# Patient Record
Sex: Female | Born: 1970 | Hispanic: Yes | Marital: Married | Smoking: Never smoker
Health system: Southern US, Community
[De-identification: ages and names within clinical notes are randomized; demographics above are authoritative.]

## PROBLEM LIST (undated history)

## (undated) DIAGNOSIS — R7303 Prediabetes: Secondary | ICD-10-CM

## (undated) DIAGNOSIS — K219 Gastro-esophageal reflux disease without esophagitis: Secondary | ICD-10-CM

## (undated) DIAGNOSIS — F32A Depression, unspecified: Secondary | ICD-10-CM

## (undated) DIAGNOSIS — K760 Fatty (change of) liver, not elsewhere classified: Secondary | ICD-10-CM

## (undated) DIAGNOSIS — F329 Major depressive disorder, single episode, unspecified: Secondary | ICD-10-CM

## (undated) DIAGNOSIS — K269 Duodenal ulcer, unspecified as acute or chronic, without hemorrhage or perforation: Secondary | ICD-10-CM

## (undated) HISTORY — DX: Duodenal ulcer, unspecified as acute or chronic, without hemorrhage or perforation: K26.9

## (undated) HISTORY — DX: Prediabetes: R73.03

## (undated) HISTORY — DX: Depression, unspecified: F32.A

## (undated) HISTORY — DX: Major depressive disorder, single episode, unspecified: F32.9

## (undated) HISTORY — DX: Fatty (change of) liver, not elsewhere classified: K76.0

## (undated) HISTORY — DX: Gastro-esophageal reflux disease without esophagitis: K21.9

---

## 1999-09-12 ENCOUNTER — Other Ambulatory Visit: Admission: RE | Admit: 1999-09-12 | Discharge: 1999-09-12 | Payer: Self-pay | Admitting: Gynecology

## 2000-12-24 ENCOUNTER — Other Ambulatory Visit: Admission: RE | Admit: 2000-12-24 | Discharge: 2000-12-24 | Payer: Self-pay | Admitting: Gynecology

## 2001-10-07 ENCOUNTER — Encounter: Admission: RE | Admit: 2001-10-07 | Discharge: 2001-10-07 | Payer: Self-pay | Admitting: Family Medicine

## 2001-10-07 ENCOUNTER — Encounter: Payer: Self-pay | Admitting: Family Medicine

## 2002-02-23 ENCOUNTER — Encounter: Admission: RE | Admit: 2002-02-23 | Discharge: 2002-03-28 | Payer: Self-pay | Admitting: Gynecology

## 2002-05-07 ENCOUNTER — Inpatient Hospital Stay (HOSPITAL_COMMUNITY): Admission: AD | Admit: 2002-05-07 | Discharge: 2002-05-10 | Payer: Self-pay | Admitting: Gynecology

## 2002-07-26 ENCOUNTER — Other Ambulatory Visit: Admission: RE | Admit: 2002-07-26 | Discharge: 2002-07-26 | Payer: Self-pay | Admitting: Gynecology

## 2003-09-28 ENCOUNTER — Other Ambulatory Visit: Admission: RE | Admit: 2003-09-28 | Discharge: 2003-09-28 | Payer: Self-pay | Admitting: Gynecology

## 2005-02-24 ENCOUNTER — Other Ambulatory Visit: Admission: RE | Admit: 2005-02-24 | Discharge: 2005-02-24 | Payer: Self-pay | Admitting: Gynecology

## 2006-01-01 ENCOUNTER — Other Ambulatory Visit: Admission: RE | Admit: 2006-01-01 | Discharge: 2006-01-01 | Payer: Self-pay | Admitting: Gynecology

## 2006-06-29 ENCOUNTER — Inpatient Hospital Stay (HOSPITAL_COMMUNITY): Admission: AD | Admit: 2006-06-29 | Discharge: 2006-07-01 | Payer: Self-pay | Admitting: Gynecology

## 2006-06-29 ENCOUNTER — Encounter (INDEPENDENT_AMBULATORY_CARE_PROVIDER_SITE_OTHER): Payer: Self-pay | Admitting: *Deleted

## 2006-06-29 HISTORY — PX: TUBAL LIGATION: SHX77

## 2006-07-03 ENCOUNTER — Inpatient Hospital Stay (HOSPITAL_COMMUNITY): Admission: AD | Admit: 2006-07-03 | Discharge: 2006-07-03 | Payer: Self-pay | Admitting: Gynecology

## 2006-08-05 ENCOUNTER — Other Ambulatory Visit: Admission: RE | Admit: 2006-08-05 | Discharge: 2006-08-05 | Payer: Self-pay | Admitting: Gynecology

## 2006-09-18 ENCOUNTER — Emergency Department (HOSPITAL_COMMUNITY): Admission: EM | Admit: 2006-09-18 | Discharge: 2006-09-19 | Payer: Self-pay | Admitting: Emergency Medicine

## 2007-07-11 ENCOUNTER — Ambulatory Visit: Payer: Self-pay | Admitting: Internal Medicine

## 2007-07-11 DIAGNOSIS — R079 Chest pain, unspecified: Secondary | ICD-10-CM | POA: Insufficient documentation

## 2007-07-11 DIAGNOSIS — R7303 Prediabetes: Secondary | ICD-10-CM | POA: Insufficient documentation

## 2007-07-15 ENCOUNTER — Ambulatory Visit: Payer: Self-pay | Admitting: Internal Medicine

## 2007-07-19 ENCOUNTER — Encounter (INDEPENDENT_AMBULATORY_CARE_PROVIDER_SITE_OTHER): Payer: Self-pay | Admitting: *Deleted

## 2007-09-16 ENCOUNTER — Other Ambulatory Visit: Admission: RE | Admit: 2007-09-16 | Discharge: 2007-09-16 | Payer: Self-pay | Admitting: Gynecology

## 2008-03-09 ENCOUNTER — Ambulatory Visit: Payer: Self-pay | Admitting: Internal Medicine

## 2008-03-09 DIAGNOSIS — R1013 Epigastric pain: Secondary | ICD-10-CM | POA: Insufficient documentation

## 2008-03-09 LAB — CONVERTED CEMR LAB
Ketones, urine, test strip: NEGATIVE
Nitrite: NEGATIVE
Specific Gravity, Urine: 1.015
Urobilinogen, UA: 0.2
WBC Urine, dipstick: NEGATIVE
pH: 6

## 2008-03-13 ENCOUNTER — Encounter: Payer: Self-pay | Admitting: Internal Medicine

## 2008-03-13 LAB — CONVERTED CEMR LAB: RBC / HPF: NONE SEEN (ref <3)

## 2008-03-15 ENCOUNTER — Telehealth (INDEPENDENT_AMBULATORY_CARE_PROVIDER_SITE_OTHER): Payer: Self-pay | Admitting: *Deleted

## 2008-03-16 ENCOUNTER — Encounter: Admission: RE | Admit: 2008-03-16 | Discharge: 2008-03-16 | Payer: Self-pay | Admitting: Internal Medicine

## 2008-03-21 ENCOUNTER — Encounter (INDEPENDENT_AMBULATORY_CARE_PROVIDER_SITE_OTHER): Payer: Self-pay | Admitting: *Deleted

## 2008-03-26 ENCOUNTER — Ambulatory Visit: Payer: Self-pay | Admitting: Internal Medicine

## 2008-03-27 LAB — CONVERTED CEMR LAB
AST: 23 units/L (ref 0–37)
Albumin: 3.9 g/dL (ref 3.5–5.2)
Alkaline Phosphatase: 39 units/L (ref 39–117)
Basophils Relative: 0.1 % (ref 0.0–3.0)
Eosinophils Absolute: 0.3 10*3/uL (ref 0.0–0.7)
Eosinophils Relative: 5.7 % — ABNORMAL HIGH (ref 0.0–5.0)
Hemoglobin: 13.4 g/dL (ref 12.0–15.0)
Lymphocytes Relative: 41.1 % (ref 12.0–46.0)
MCV: 97.4 fL (ref 78.0–100.0)
Neutrophils Relative %: 44.7 % (ref 43.0–77.0)
RBC: 3.98 M/uL (ref 3.87–5.11)
Total Protein: 6.9 g/dL (ref 6.0–8.3)
WBC: 5.2 10*3/uL (ref 4.5–10.5)

## 2008-03-29 ENCOUNTER — Encounter (INDEPENDENT_AMBULATORY_CARE_PROVIDER_SITE_OTHER): Payer: Self-pay | Admitting: *Deleted

## 2008-04-23 ENCOUNTER — Ambulatory Visit: Payer: Self-pay | Admitting: Gastroenterology

## 2008-04-26 ENCOUNTER — Ambulatory Visit: Payer: Self-pay | Admitting: Gastroenterology

## 2008-06-20 ENCOUNTER — Ambulatory Visit: Payer: Self-pay | Admitting: Gastroenterology

## 2008-10-26 ENCOUNTER — Other Ambulatory Visit: Admission: RE | Admit: 2008-10-26 | Discharge: 2008-10-26 | Payer: Self-pay | Admitting: Gynecology

## 2008-10-26 ENCOUNTER — Encounter: Payer: Self-pay | Admitting: Gynecology

## 2008-10-26 ENCOUNTER — Ambulatory Visit: Payer: Self-pay | Admitting: Gynecology

## 2009-11-15 ENCOUNTER — Ambulatory Visit: Payer: Self-pay | Admitting: Gynecology

## 2009-11-15 ENCOUNTER — Other Ambulatory Visit: Admission: RE | Admit: 2009-11-15 | Discharge: 2009-11-15 | Payer: Self-pay | Admitting: Gynecology

## 2009-11-22 ENCOUNTER — Ambulatory Visit (HOSPITAL_COMMUNITY): Admission: RE | Admit: 2009-11-22 | Discharge: 2009-11-22 | Payer: Self-pay | Admitting: Gynecology

## 2009-12-06 ENCOUNTER — Ambulatory Visit: Payer: Self-pay | Admitting: Internal Medicine

## 2009-12-06 DIAGNOSIS — B353 Tinea pedis: Secondary | ICD-10-CM | POA: Insufficient documentation

## 2009-12-06 DIAGNOSIS — B351 Tinea unguium: Secondary | ICD-10-CM | POA: Insufficient documentation

## 2009-12-06 LAB — CONVERTED CEMR LAB
AST: 16 units/L (ref 0–37)
Alkaline Phosphatase: 47 units/L (ref 39–117)
BUN: 17 mg/dL (ref 6–23)
Basophils Absolute: 0 10*3/uL (ref 0.0–0.1)
Calcium: 9.4 mg/dL (ref 8.4–10.5)
GFR calc non Af Amer: 91.26 mL/min (ref >60)
Glucose, Bld: 95 mg/dL (ref 70–99)
Lymphocytes Relative: 32.9 % (ref 12.0–46.0)
Monocytes Relative: 6.9 % (ref 3.0–12.0)
Neutrophils Relative %: 58.5 % (ref 43.0–77.0)
Platelets: 244 10*3/uL (ref 150.0–400.0)
RDW: 13.9 % (ref 11.5–14.6)
Total Bilirubin: 0.8 mg/dL (ref 0.3–1.2)

## 2009-12-09 ENCOUNTER — Encounter: Payer: Self-pay | Admitting: Internal Medicine

## 2010-07-06 ENCOUNTER — Encounter: Payer: Self-pay | Admitting: Internal Medicine

## 2010-07-13 LAB — CONVERTED CEMR LAB
ALT: 21 units/L (ref 0–35)
AST: 22 units/L (ref 0–37)
BUN: 11 mg/dL (ref 6–23)
Basophils Absolute: 0 10*3/uL (ref 0.0–0.1)
Calcium: 8.5 mg/dL (ref 8.4–10.5)
Cholesterol: 116 mg/dL (ref 0–200)
Eosinophils Absolute: 0.1 10*3/uL (ref 0.0–0.6)
GFR calc Af Amer: 145 mL/min
GFR calc non Af Amer: 120 mL/min
HDL: 34.7 mg/dL — ABNORMAL LOW (ref >39.0)
Hemoglobin: 13.5 g/dL (ref 12.0–15.0)
Lymphocytes Relative: 26 % (ref 12.0–46.0)
MCHC: 33.5 g/dL (ref 30.0–36.0)
Monocytes Absolute: 0.4 10*3/uL (ref 0.2–0.7)
Monocytes Relative: 12.8 % — ABNORMAL HIGH (ref 3.0–11.0)
Neutro Abs: 2 10*3/uL (ref 1.4–7.7)
Platelets: 235 10*3/uL (ref 150–400)
Potassium: 3.5 meq/L (ref 3.5–5.1)
Triglycerides: 71 mg/dL (ref 0–149)

## 2010-07-15 NOTE — Letter (Signed)
Summary: Results Follow-up Letter  [facility]  [address] [place], [place] [address]   Phone: [phone]  Fax: [phone]    12/09/2009  [address] [place], [place]  [address]  Dear Ms. Gwynne,   The following are the results of your recent test(s):  Test     Result     Chest Xray     normal Liver/kidney   normal CBC       normal  _________________________________________________________  Please call for an appointment in 1-2 months _________________________________________________________ _________________________________________________________ _________________________________________________________  Sincerely,  Sanda Linger MD [facility]

## 2010-07-15 NOTE — Assessment & Plan Note (Signed)
Summary: NEW / BCBS/#/CD   Vital Signs:  Patient profile:   40 year old female Menstrual status:  regular LMP:     12/02/2009 Height:      60 inches Weight:      120.75 pounds BMI:     23.67 O2 Sat:      98 % on Room air Temp:     97.9 degrees F oral Pulse rate:   57 / minute Pulse rhythm:   regular BP sitting:   104 / 70  (left arm) Cuff size:   large  Vitals Entered By: Rock Nephew CMA (December 06, 2009 3:54 PM)  O2 Flow:  Room air CC: left chest pain for 6 months Is Patient Diabetic? No Pain Assessment Patient in pain? no      LMP (date): 12/02/2009     Menstrual Status regular Enter LMP: 12/02/2009   Primary Care Provider:  Etta Grandchild MD  CC:  left chest pain for 6 months.  History of Present Illness:       This is a 40 year old female who presents with Chest pain.  The symptoms began 4-6 months ago.  On a scale of 1 to 10, the intensity is described as a 1.  The patient reports resting chest pain, but denies exertional chest pain, nausea, vomiting, diaphoresis, shortness of breath, palpitations, dizziness, light headedness, syncope, and indigestion.  The pain is described as intermittent and sharp.  The pain is located in the left upper chest.  Episodes of chest pain last 5-10 minutes.  It hurts when she presses on a specific area in the left axillary region. Her recent mammogram was normal. She wants an xray done of the area.  She also c/o's toenail fungus and itchy rash on both feet.  Preventive Screening-Counseling & Management  Alcohol-Tobacco     Alcohol drinks/day: <1     Alcohol type: all     >5/day in last 3 mos: no     Alcohol Counseling: not indicated; use of alcohol is not excessive or problematic     Feels need to cut down: no     Feels annoyed by complaints: no     Feels guilty re: drinking: no     Needs 'eye opener' in am: no     Smoking Status: never  Hep-HIV-STD-Contraception     Hepatitis Risk: no risk noted     HIV Risk: no risk  noted     STD Risk: no risk noted     SBE monthly: yes     SBE Education/Counseling: to perform regular SBE      Sexual History:  currently monogamous.        Drug Use:  never.        Blood Transfusions:  no.    Clinical Review Panels:  Diabetes Management   Creatinine:  0.6 (07/11/2007)  CBC   WBC:  5.2 (03/26/2008)   RBC:  3.98 (03/26/2008)   Hgb:  13.4 (03/26/2008)   Hct:  38.7 (03/26/2008)   Platelets:  207 (03/26/2008)   MCV  97.4 (03/26/2008)   MCHC  34.5 (03/26/2008)   RDW  12.5 (03/26/2008)   PMN:  44.7 (03/26/2008)   Lymphs:  41.1 (03/26/2008)   Monos:  8.4 (03/26/2008)   Eosinophils:  5.7 (03/26/2008)   Basophil:  0.1 (03/26/2008)  Complete Metabolic Panel   Glucose:  96 (07/11/2007)   Sodium:  138 (07/11/2007)   Potassium:  3.5 (07/11/2007)   Chloride:  106 (07/11/2007)  CO2:  26 (07/11/2007)   BUN:  11 (07/11/2007)   Creatinine:  0.6 (07/11/2007)   Albumin:  3.9 (03/26/2008)   Total Protein:  6.9 (03/26/2008)   Calcium:  8.5 (07/11/2007)   Total Bili:  0.8 (03/26/2008)   Alk Phos:  39 (03/26/2008)   SGPT (ALT):  32 (03/26/2008)   SGOT (AST):  23 (03/26/2008)   Medications Prior to Update: 1)  Protonix 40 Mg Tbec (Pantoprazole Sodium) .Marland Kitchen.. 1 By Mouth Two Times A Day Before Meals 2)  Hyomax-Sr 0.375 Mg Xr12h-Tab (Hyoscyamine Sulfate) .Marland Kitchen.. 1 Tab Twice A Day For 5 Days Then As Needed  Current Medications (verified): 1)  Ciclopirox 0.77 % Gel (Ciclopirox) .... Apply To Aa Two Times A Day For 14 Days 2)  Terbinafine Hcl 250 Mg Tabs (Terbinafine Hcl) .... One By Mouth Once Daily  Allergies (verified): No Known Drug Allergies  Past History:  Past Medical History: Last updated: 07/11/2007 G3P3 h/o gestational DM in first 2 preg.  Past Surgical History: Last updated: 07/11/2007 Tubal ligation  Family History: Last updated: 07/11/2007 DM--uncle CAD--no colon ca--no breast ca--no HTN-- ?  Social History: Last updated:  04/23/2008 Married 3 kids From [place] Patient has never smoked.  Alcohol Use - no  Risk Factors: Alcohol Use: <1 (12/06/2009) >5 drinks/d w/in last 3 months: no (12/06/2009)  Risk Factors: Smoking Status: never (12/06/2009)  Family History: Reviewed history from 07/11/2007 and no changes required. DM--uncle CAD--no colon ca--no breast ca--no HTN-- ?  Social History: Reviewed history from 04/23/2008 and no changes required. Married 3 kids From [place] Patient has never smoked.  Alcohol Use - no Hepatitis Risk:  no risk noted HIV Risk:  no risk noted STD Risk:  no risk noted Sexual History:  currently monogamous Drug Use:  never Blood Transfusions:  no  Review of Systems  The patient denies anorexia, fever, weight loss, weight gain, syncope, dyspnea on exertion, peripheral edema, prolonged cough, headaches, hemoptysis, abdominal pain, melena, hematochezia, severe indigestion/heartburn, hematuria, suspicious skin lesions, enlarged lymph nodes, angioedema, and breast masses.   General:  Denies chills, fatigue, fever, loss of appetite, malaise, and sleep disorder. Resp:  Complains of chest discomfort; denies chest pain with inspiration, cough, coughing up blood, shortness of breath, sputum productive, and wheezing.  Physical Exam  General:  alert, well-developed, well-nourished, well-hydrated, appropriate dress, and normal appearance.   Head:  normocephalic and atraumatic.   Eyes:  No corneal or conjunctival inflammation noted. EOMI. Perrla. Funduscopic exam benign, without hemorrhages, exudates or papilledema. Vision grossly normal. Mouth:  Oral mucosa and oropharynx without lesions or exudates.  Teeth in good repair. Neck:  supple, full ROM, no masses, no thyromegaly, no thyroid nodules or tenderness, no JVD, normal carotid upstroke, no carotid bruits, no cervical lymphadenopathy, and no neck tenderness.   Chest Wall:  no deformities, no tenderness, and no  mass.   Breasts:  No mass, nodules, thickening, tenderness, bulging, retraction, inflamation, nipple discharge or skin changes noted.   Lungs:  Normal respiratory effort, chest expands symmetrically. Lungs are clear to auscultation, no crackles or wheezes. Heart:  Normal rate and regular rhythm. S1 and S2 normal without gallop, murmur, click, rub or other extra sounds. Abdomen:  soft, non-tender, normal bowel sounds, no distention, no masses, no guarding, no rigidity, no rebound tenderness, no abdominal hernia, no inguinal hernia, no hepatomegaly, and no splenomegaly.   Msk:  normal ROM, no joint tenderness, no joint swelling, no joint warmth, no redness over joints, no joint deformities, no joint  instability, and no crepitation.   Pulses:  R and L carotid,radial,femoral,dorsalis pedis and posterior tibial pulses are full and equal bilaterally Extremities:  No clubbing, cyanosis, edema, or deformity noted with normal full range of motion of all joints.   Neurologic:  No cranial nerve deficits noted. Station and gait are normal. Plantar reflexes are down-going bilaterally. DTRs are symmetrical throughout. Sensory, motor and coordinative functions appear intact. Skin:  feet have diffuse scaling on sides and bottoms of the feet and all toenails have sugungual debris, lysis, and thickening Cervical Nodes:  no anterior cervical adenopathy and no posterior cervical adenopathy.   Axillary Nodes:  no R axillary adenopathy and no L axillary adenopathy.   Inguinal Nodes:  no R inguinal adenopathy and no L inguinal adenopathy.   Psych:  Cognition and judgment appear intact. Alert and cooperative with normal attention span and concentration. No apparent delusions, illusions, hallucinations   Impression & Recommendations:  Problem # 1:  ONYCHOMYCOSIS, TOENAILS (ICD-110.1) Assessment New  Her updated medication list for this problem includes:    Ciclopirox 0.77 % Gel (Ciclopirox) .Marland Kitchen... Apply to aa two times  a day for 14 days    Terbinafine Hcl 250 Mg Tabs (Terbinafine hcl) ..... One by mouth once daily  Orders: Venipuncture (66440) TLB-BMP (Basic Metabolic Panel-BMET) (80048-METABOL) TLB-CBC Platelet - w/Differential (85025-CBCD) TLB-Hepatic/Liver Function Pnl (80076-HEPATIC)  Problem # 2:  DERMATOPHYTOSIS OF FOOT (ICD-110.4) Assessment: New  Her updated medication list for this problem includes:    Ciclopirox 0.77 % Gel (Ciclopirox) .Marland Kitchen... Apply to aa two times a day for 14 days    Terbinafine Hcl 250 Mg Tabs (Terbinafine hcl) ..... One by mouth once daily  Orders: Venipuncture (34742) TLB-BMP (Basic Metabolic Panel-BMET) (80048-METABOL) TLB-CBC Platelet - w/Differential (85025-CBCD) TLB-Hepatic/Liver Function Pnl (80076-HEPATIC)  Problem # 3:  CHEST PAIN (ICD-786.50) Assessment: Unchanged will check for structural lesions of the rib cage, she has no s/s of systemic illness so I favor a MS cause for her pain. Orders: T-2 View CXR (71020TC) Venipuncture (59563) TLB-BMP (Basic Metabolic Panel-BMET) (80048-METABOL) TLB-CBC Platelet - w/Differential (85025-CBCD) TLB-Hepatic/Liver Function Pnl (80076-HEPATIC)  Complete Medication List: 1)  Ciclopirox 0.77 % Gel (Ciclopirox) .... Apply to aa two times a day for 14 days 2)  Terbinafine Hcl 250 Mg Tabs (Terbinafine hcl) .... One by mouth once daily  Patient Instructions: 1)  Please schedule a follow-up appointment in 1 month. 2)  It is important that you exercise regularly at least 20 minutes 5 times a week. If you develop chest pain, have severe difficulty breathing, or feel very tired , stop exercising immediately and seek medical attention. 3)  You need to lose weight. Consider a lower calorie diet and regular exercise.  Prescriptions: TERBINAFINE HCL 250 MG TABS (TERBINAFINE HCL) One by mouth once daily  #30 x 4   Entered and Authorized by:   Etta Grandchild MD   Signed by:   Etta Grandchild MD on 12/06/2009   Method used:    Print then Give to Patient   RxID:   [id number] CICLOPIROX 0.77 % GEL (CICLOPIROX) Apply to AA two times a day for 14 days  #45 gms x 3   Entered and Authorized by:   Etta Grandchild MD   Signed by:   Etta Grandchild MD on 12/06/2009   Method used:   Print then Give to Patient   RxID:   [id number]

## 2010-10-31 NOTE — Op Note (Signed)
NAMELEONOR, Samantha Robertson NO.:  [id number]   MEDICAL RECORD NO.:  [id number]                   PATIENT TYPE:  INP   LOCATION:  9114                                 FACILITY:  [facility]   PHYSICIAN:  Ivor Costa. Farrel Gobble, M.D.              DATE OF BIRTH:  [birth year]   DATE OF PROCEDURE:  05/07/2002  DATE OF DISCHARGE:                                 OPERATIVE REPORT   PREOPERATIVE DIAGNOSES:  1. OP arrest.  2. Cephalopelvic disproportion.   POSTOPERATIVE DIAGNOSES:  1. OP arrest.  2. Cephalopelvic disproportion.   PROCEDURE:  Primary cesarean, low transverse.   SURGEON:  Ivor Costa. Farrel Gobble, M.D.   ANESTHESIA:  Spinal.   ESTIMATED BLOOD LOSS:  500 cc.   FINDINGS:  Viable female infant in the vertex presentation.  Apgars 8 and 9.  Birth weight 6, 9.  Normal uterus, tubes, and ovaries.   COMPLICATIONS:  None.   PATHOLOGY:  None.   PROCEDURE:  The patient was taken to the operating room. Spinal anesthesia  was induced and placed in the supine position, left lateral position.  She  was prepped and draped in the usual sterile fashion.  After adequate  anesthesia was inserted, a Pfannenstiel skin incision was made with the  scalpel and carried through the underlying layer of fascia with a second  blade.  The fascia was scored in the midline.  The incision was lowered with  the Mayo scissors, and the superior aspect of the fascial incision was  elevated.  The underlying rectus muscles were dissected off sharply.  In a  similar fashion, the superior aspect of the incision was grasped with  Kochers, and the underlying rectus muscles were dissected off.  The rectus  muscles were separated in the midline, and the peritoneum was identified and  entered sharply and extended superiorly and inferiorly with good  visualization of underlying bowel and bladder.  The bladder blade was  inserted.  The vesicouterine peritoneum was identified and entered sharply  with the  Metzenbaums.  The incision was extended laterally.  The bladder  flap was created digitally.  The bladder blade was reinserted in the lower  uterine segment. This was incised in a transverse fashion with the scalpel.  The infant was delivered from the vertex presentation.  The cord was cut and  clamped and handed off to the awaiting pediatrician. Cord bloods were  obtained.  The uterus was incised. The placenta was separated naturally.  The uterus was cleared of any residual membranes.  The uterus was then  closed in a running locked layer of 0 chromic.  The adnexa were inspected,  irrigated with copious amounts of warm saline.  The gutters were cleared of  all clots and debris.  There was some bleeding in the midsection of the  incision, which was imbricated in a running stitch of 0 chromic.  Reinspection assured [place] of hemostasis.  The bladder flap,  peritoneum,  muscles, and fascia were all noted  to be hemostatic.  The fascia was then closed with 0 Vicryl in a running  fashion.  The subcu was irrigated.  The skin was closed with staples.  The  patient tolerated the procedure well.  Sponge, needle, and lap counts were  correct x2.  She was transferred to the PACU in a stable condition.  She  received 2 gm of Ancef intraoperatively.                                               Ivor Costa. Farrel Gobble, M.D.    THL/MEDQ  D:  05/07/2002  T:  05/07/2002  Job:  [id number]

## 2010-10-31 NOTE — Discharge Summary (Signed)
Samantha Robertson, BO NO.:  [id number]   MEDICAL RECORD NO.:  [id number]          PATIENT TYPE:  INP   LOCATION:  9104                          FACILITY:  [facility]   PHYSICIAN:  Juan H. Lily Peer, M.D.DATE OF BIRTH:  16-May-1971   DATE OF ADMISSION:  06/29/2006  DATE OF DISCHARGE:                               DISCHARGE SUMMARY   HISTORY:  The patient is a 40 year old gravida 3, para 2 at 60 weeks  estimated gestational age who underwent a repeat lower uterine segment  transverse cesarean section.  The patient with prior cesarean section.  She had been evaluated prenatally and was found to have on ultrasound  nuchal cord x2, so her induction was moved up a week-and-a-half early.  She had had reassuring fetal heart rate tracing.  She underwent a repeat  lower uterine segment transverse cesarean section along with bilateral  tubal sterilization procedure on June 29, 2006, by Dr. Gaetano Hawthorne.  Lily Peer.  The patient delivered a viable female infant, Apgars of 8  and 9, with a weight of 7 pounds.  Postoperatively, the patient did  well.  Her postoperative day #1 her hemoglobin was 8.9, blood type was O  positive, rubella immune.  She had good urinary output and the Foley  catheter was discontinued.  She was advanced from a clear liquid to a  regular diet and was placed on oral narcotics.  By her second  postoperative day she was doing well, ambulating well, tolerating a  regular diet well, passing gas, and had stable vital signs and wanted to  be discharged home.   FINAL DIAGNOSES:  1. Term intrauterine pregnancy.  2. Previous cesarean section.  3. Requests elective repeat cesarean section.  4. Anemia.  5. Requests elective permanent sterilization.   PROCEDURE PERFORMED:  1. Repeat lower uterine segment transverse cesarean section.  2. Bilateral tubal sterilization procedure, Pomeroy technique.   FINAL DISPOSITION AND FOLLOWUP:  The patient was discharged home on  her  second postoperative day.  She was instructed to take her prenatal  vitamins and iron daily.  She will return back to the office in the next  few days to have her staples removed.  Instructions were provided.  She  was given a prescription of Tylox to take one p.o. q.4-6h. p.r.n. pain  and she was given Reglan 10 mg to take one p.o. b.i.d. to help with her  breast milk production.  All the above was explained to the patient in  Spanish and will follow accordingly.      Juan H. Lily Peer, M.D.  Electronically Signed     JHF/MEDQ  D:  07/01/2006  T:  07/01/2006  Job:  [id number]

## 2010-10-31 NOTE — H&P (Signed)
Samantha Robertson, Samantha Robertson NO.:  [id number]   MEDICAL RECORD NO.:  [id number]                   PATIENT TYPE:  MAT   LOCATION:  [facility]                                 [facility]ILITY[facility]   PHYSICIAN:  Ivor Costa. Farrel Gobble, M.D.              DATE OF BIRTH:  Jan 24, 1971   DATE OF ADMISSION:  05/07/2002  DATE OF DISCHARGE:                                HISTORY & PHYSICAL   CHIEF COMPLAINT:  Labor.   HISTORY OF PRESENT ILLNESS:  The patient is a 40 year old G2, P102, whose LMP  is July 29, 2001, estimated date of confinement of May 05, 2002,  who presented to labor and delivery with approximately 2-1/2 hours of  regular labor and contractions every 5 minutes.  Upon presentation to labor  and delivery, she was noted to be fully dilated and a +2 station and was in  marked distress secondary to discomfort.  The patient ruptured clear fluid  en route to the hospital.  The patient was felt that she could not be  transferred to labor and delivery because she most likely would  precipitously delivery, and she began pushing, the room was set up for  delivery.  The patient was given a pudendal block with 20 cc of 1% lidocaine  solution for comfort.  An IV was placed, and the patient began pushing.  Despite good maternal expulsive efforts, the fetal vertex failed to move.  Examination showed that the fetus was in the LOT presentation.  This was  discussed with the patient in Spanish, and we offered her continued pushing  as the fetal heart rate tracing was acceptable, versus an attempt to rotate  into an OP presentation. The patient accepted the latter after risks were  discussed and the procedure was discussed.  The infant was easily placed in  the OP presentation; however, she failed to move on the following  contraction with good maternal expulsive efforts and gentle traction.  The  forceps were then removed.  We confirmed indeed that the infant was  successfully  rotated.  I discussed with the patient my suspicion is because  of the OP presentation that the infant will not be able to maneuver the  birth canal, and we would be best to proceed with a primary cesarean section  in light of the fact that now she has been pushing for one hour without any  change of station.  The patient's husband was brought into the room as he  speaks better [place] than she, and she agreed and the consents were signed.  Of note, the patient's bladder was straight cathed prior to placement of the  forceps.  Her pregnancy, otherwise, had been uncomplicated.  She had one  spontaneous vaginal delivery in 1998 that was complicated secondary to [facility]  with induction.  He was 6 lb, 9 oz.   Pregnancy per patient has been uncomplicated. Prenatal labs showed O+,  antibody negative,  RPR nonreactive, rubella immune, hepatitis B surface  antigen nonreactive, rubella nonreactive, GBS positive.   PAST OB-GYN HISTORY:  Regular menses, normal Pap smears, no history of STDs.   PAST MEDICAL HISTORY:  Negative.   SURGICAL HISTORY:  Negative.   PHYSICAL EXAMINATION:  (Which is limited)  ABDOMEN:  Gravid, nontender with contractions palpable.  VAGINAL:  She is now direct OP, +2 station.  She was brought up to the +1  station in order to facilitate a C section.  EXTREMITIES:  No edema.    ASSESSMENT:  Presentation.  Suspect cephalopelvic disproportion.   PLAN:  The patient will be transferred to the OR for a primary cesarean  section.  All questions were addressed.                                               Ivor Costa. Farrel Gobble, M.D.    THL/MEDQ  D:  05/07/2002  T:  05/07/2002  Job:  [id number]

## 2010-10-31 NOTE — H&P (Signed)
NAMENADALEE, NEISWENDER NO.:  [id number]   MEDICAL RECORD NO.:  [id number]          PATIENT TYPE:  INP   LOCATION:  NA                            [facility]   PHYSICIAN:  Juan H. Lily Peer, M.D.DATE OF BIRTH:  1970-09-03   DATE OF ADMISSION:  06/29/2006  DATE OF DISCHARGE:                              HISTORY & PHYSICAL   CHIEF COMPLAINT:  1. Term intrauterine pregnancy.  2. Advanced maternal age.  3. Gestational diabetes, diet controlled.  4. Previous cesarean section for elective repeat with request for      elective sterilization.  5. Nuchal cord x2.   HISTORY:  The patient is a 40 year old, gravida 3, para 2 with last  menstrual period October 10, 2005, estimated date of confinement July 18, 2006. The patient will be 37-1/2 to [redacted] weeks gestation at the time of  repeat cesarean section. Her C section originally had been scheduled for  a week and a half after that but due to the fact that she was seen in  the office recently for an ultrasound for growth, due to the fact that  she is a gestational diabetic, it was noted at that time that there was  a double nuchal cord noted. Estimated fetal weight was 6 pounds 13  ounces and the AFI was in the 52nd percentile. The fetus was in a  transverse lie. She had had a reactive NST that day. 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 She developed gestational diabetes, was essentially followed  closely and was placed on a special diabetic diet and did well. She also  had been treated twice early in her pregnancy and then approximately 25-  1/2 weeks for bacterial vaginosis as well as moniliasis.   PAST MEDICAL HISTORY:  She denies any allergies. She had one vaginal  delivery in 1988 and then had a C section in 2003. She  had gestational  diabetes this pregnancy and prior pregnancy she had pregnancy-induced  hypertension but has been normotensive during this pregnancy.   REVIEW OF SYSTEMS:  See [employer] form.   PHYSICAL EXAMINATION:  VITAL SIGNS:  Blood pressure was 108/70. Urine  was negative for protein glucose. Weight 148 pounds.  HEENT:  Unremarkable.  NECK:  Supple. Trachea midline. No carotid bruits. No thyromegaly.  LUNGS:  Clear to auscultation without rhonchi or wheezes.  HEART:  Regular rate and rhythm without murmurs or gallops.  BREASTS:  Not done.  ABDOMEN:  Gravid uterus, fetus in a transverse lie position. Cervix is  long, closed and posterior.  EXTREMITIES:  DTR 1+, negative clonus.   PRENATAL LABS:  O+ blood type, negative antibody screen. VDRL was  nonreactive, rubella immune. Hepatitis B surface antigen and HIV were  negative. Diabetes screen was normal. GBS culture had not been done.  Patient with prior history of positive GBS in past pregnancy. The  patient was __________  first trimester screen, had a normal genetic  amniocentesis as a result of her advanced maternal age, she had 46xx  female chromosome with normal amniotic fluid, AFP.   ASSESSMENT:  A 40 year old, gravida 3, para 2 with a history of previous  cesarean section who was scheduled for cesarean section at 39-40 weeks  and the time table was moved up to 37-38 weeks due to the fact that her  recent ultrasound had demonstrated a double nuchal cord. The patient had  been followed closely with NST twice a week and fetal movement kick  counts had been instructed and she is scheduled for her repeat cesarean  section for Tuesday, January 15 at 9:00 a.m. at [facility]. The  risks and benefits and pros and cons were discussed. She also was  requesting elective permanent sterilization. She is fully aware that she  will not be able to have any more children once her tubes are tied and  she is fully aware and  accepts.   PLAN:  The patient is scheduled for repeat lower uterine segment  transverse cesarean section along with bilateral tubal sterilization  procedure on Tuesday, January 15 at 9:00 a.m. at [facility].      [place] H. Lily Peer, M.D.  Electronically Signed     JHF/MEDQ  D:  06/25/2006  T:  06/25/2006  Job:  [id number]

## 2010-10-31 NOTE — Op Note (Signed)
NAMERAMANDA, PAULES NO.:  [id number]   MEDICAL RECORD NO.:  [id number]          PATIENT TYPE:  INP   LOCATION:  9199                          [facility]ACILITY:  [facility]   PHYSICIAN:  Juan H. Lily Peer, M.D.DATE OF BIRTH:  25-Aug-1970   DATE OF PROCEDURE:  06/29/2006  DATE OF DISCHARGE:                               OPERATIVE REPORT   SURGEON:  Juan H. Lily Peer, M.D.   FIRST ASSISTANT:  Colin Broach, M.D.   INDICATIONS FOR OPERATION:  A 40 year old gravida 3, para 2 at 37-1/2  weeks estimated gestational age with previous cesarean section for  elective repeat.  C-section was moved up due to the fact at time of  ultrasound for presentation and for growth assessment it was found to  the fetus was in a transverse lie with a nuchal cord x2.  The patient  also had requested elective permanent sterilization at time of repeat  cesarean section.   PREOPERATIVE DIAGNOSES:  1. Term intrauterine pregnancy.  2. Transverse lie.  3. Previous cesarean section.  4. Nuchal cord x2.  5. Previous cesarean section for elective repeat.  6. Elective bilateral tubal sterilization procedure.   POSTOPERATIVE DIAGNOSIS:  1. Term intrauterine pregnancy.  2. Transverse lie.  3. Previous cesarean section.  4. Nuchal cord x2.  5. Previous cesarean section for elective repeat.  6. Elective bilateral tubal sterilization procedure.   ANESTHESIA:  Was spinal.   PROCEDURES PERFORMED:  1. Repeat lower uterine segment transverse cesarean section.  2. Bilateral tubal sterilization.   PROCEDURE:  Pomeroy technique.   FINDINGS:  Fetus was found to be in the transverse lie with a nuchal  cord x2.  Clear amniotic fluid.  Normal maternal pelvic anatomy.  Apparent intramural myomas ?Marland Kitchen  Normal tubes and ovaries.  Viable female  infant, Apgars of 8/9 with a weight of 7 pounds.   DESCRIPTION OF OPERATION:  After the patient was adequately counseled,  she was taken to the operating room where she  underwent a successful  spinal anesthesia.  Her abdomen was prepped and draped in usual sterile  fashion.  A Foley catheter had been inserted in an effort to monitor  urinary output.  A Pfannenstiel skin incision was made 2 cm above the  symphysis pubis adjacent to the previous Pfannenstiel scar.  The  incision was carried down from the skin, down to the subcutaneous  tissue, down to the rectus fascia where a midline nick was made.  The  fascia was incised in a transverse fashion.  The midline raphe was  entered cautiously.  The peritoneal cavity was entered.  The bladder  flap was established.  The lower uterine segment was incised in a  transverse fashion.  Clear amniotic fluid was present.  The surgeon's  hand was introduced into the uterus in an effort to rotate the fetus in  a vertex presentation which was accomplished and delivered.  The nuchal  cord x2 was manually reduced.  The nasopharyngeal area was bulb  suctioned.  The newborn was delivered.  The cord was doubly clamped and  excised and passed off the operative field after being shown to  the  parents and passed off to the neonatologist who were in attendance who  gave the above-mentioned parameters.  After cord blood was obtained,  Pitocin drip was started.  The patient received a gram of cefoxitin IV  for prophylaxis.  The placenta and cord was delivered and passed off the  operative field.  The uterus was then exteriorized.  Intrauterine cavity  was swept clear of the remaining products of conception.  The lower  uterine transverse incision was closed in a locking stitch a single  layer with 0 Vicryl suture.  The uterus was placed back in the pelvic  cavity.  The pelvic cavity was copiously irrigated with normal saline  solution.  The left fallopian tube was brought into view after being  grasped with a Babcock clamp.  A 2-cm segment was suture ligated with 3-  0 plain suture x2, and a 2-cm segment was excised and passed off  the  operative field.  The remaining stumps were Bovie cauterized.  Similar  procedure was carried out on the contralateral side.  The pelvic cavity  was copiously irrigated with normal saline solution once again.  After  ascertaining adequate hemostasis, closure was started.  The visceral  peritoneum was not closed.  Sponge count and needle count were correct.  The rectus fascia was closed with running stitch of 0 Vicryl suture.  The subcutaneous bleeders were Bovie cauterized.  The skin was  reapproximated with skin clips followed by placing Xeroform gauze and  4x8 dressing.  The patient was transferred to the recovery with stable  vital signs.  Blood loss from the procedure was 800 mL.  IV fluids  consisted 2800 mL of lactated Ringer's.  Urine output was 150 mL.      Juan H. Lily Peer, M.D.  Electronically Signed     JHF/MEDQ  D:  06/29/2006  T:  06/29/2006  Job:  [id number]

## 2010-10-31 NOTE — Discharge Summary (Signed)
   NAMEJALIYAH, Samantha Robertson NO.:  [id number]   MEDICAL RECORD NO.:  [id number]                   PATIENT TYPE:  INP   LOCATION:  9114                                 [facility]ACILITY:  [facility]   PHYSICIAN:  Ivor Costa. Farrel Gobble, M.D.              DATE OF BIRTH:  10-06-1970   DATE OF ADMISSION:  05/07/2002  DATE OF DISCHARGE:  05/10/2002                                 DISCHARGE SUMMARY   DISCHARGE DIAGNOSES:  1. Intrauterine pregnancy 40+ weeks, delivered.  2. OP arrest.  3. Cephalopelvic disproportion.  4. Status post primary C-section, low transverse by Dr. Douglass Rivers on     May 07, 2002.   HISTORY OF PRESENT ILLNESS:  The patient is a 40 year old female, Gravida  II, Para I who's prenatal course had been uncomplicated.   HOSPITAL COURSE:  On May 07, 2002 the patient presented at 40 weeks in  labor. Upon arrival to labor and delivery, she was noted to be fully dilated  and in marked distress secondary to discomfort. The patient ruptured with  clear fluid en route to the hospital and it was felt that secondary to  advanced labor, would most likely would have precipitous delivery and she  began pushing. The room was set up for delivery. The patient's pushed for  one hour and even with the forceps assisted, was not able to deliver and was  felt to have OP arrest with cephalopelvic disproportion. Therefore, the  patient underwent a primary C-section, low transverse, by Dr. Douglass Rivers  on May 07, 2002 and underwent delivery of a female with Apgar's of 8  and 9, weighing 6 pounds, and 9 ounces. Postpartum, the patient remained  afebrile. Voiding and in stable condition. She was discharged to home on  May 07, 2002 and given [facility] Gynecological Postpartum Instruction  booklet.   LABORATORY DATA:  On May 08, 2002 hemoglobin was 10.6, hematocrit 31.6.  The patient is 0+ and Rubella immune.   DISPOSITION:  The patient is discharged to  home.   FOLLOW UP:  To return in four to six weeks.   MEDICAL DECISION MAKING:  Percocet as needed for pain.     Susa Loffler, P.A.                    Ivor Costa. Farrel Gobble, M.D.    TSG/MEDQ  D:  06/02/2002  T:  06/04/2002  Job:  [id number]   cc:   Ivor Costa. Farrel Gobble, M.D.  [address], [place]  [place]  [place] [address]  Fax: [phone]

## 2011-01-02 ENCOUNTER — Other Ambulatory Visit (HOSPITAL_COMMUNITY)
Admission: RE | Admit: 2011-01-02 | Discharge: 2011-01-02 | Disposition: A | Discharge status: Home / Self Care | Payer: Commercial Managed Care - PPO | Source: Ambulatory Visit | Attending: Gynecology | Admitting: Gynecology

## 2011-01-02 ENCOUNTER — Other Ambulatory Visit: Payer: Self-pay | Admitting: Gynecology

## 2011-01-02 ENCOUNTER — Encounter (INDEPENDENT_AMBULATORY_CARE_PROVIDER_SITE_OTHER): Payer: Commercial Managed Care - PPO | Admitting: Gynecology

## 2011-01-02 DIAGNOSIS — Z124 Encounter for screening for malignant neoplasm of cervix: Secondary | ICD-10-CM | POA: Insufficient documentation

## 2011-01-02 DIAGNOSIS — R823 Hemoglobinuria: Secondary | ICD-10-CM

## 2011-01-02 DIAGNOSIS — E079 Disorder of thyroid, unspecified: Secondary | ICD-10-CM

## 2011-01-02 DIAGNOSIS — Z01419 Encounter for gynecological examination (general) (routine) without abnormal findings: Secondary | ICD-10-CM

## 2011-01-02 DIAGNOSIS — Z833 Family history of diabetes mellitus: Secondary | ICD-10-CM

## 2011-01-02 DIAGNOSIS — Z13220 Encounter for screening for lipoid disorders: Secondary | ICD-10-CM

## 2011-02-02 IMAGING — CR DG CHEST 2V
2 series · 2 of 2 positions shown · non-contrast
Comparison: 07/15/2007

CLINICAL DATA: Short of breath

CHEST - 2 VIEW

[view not recorded (1 of 2)]
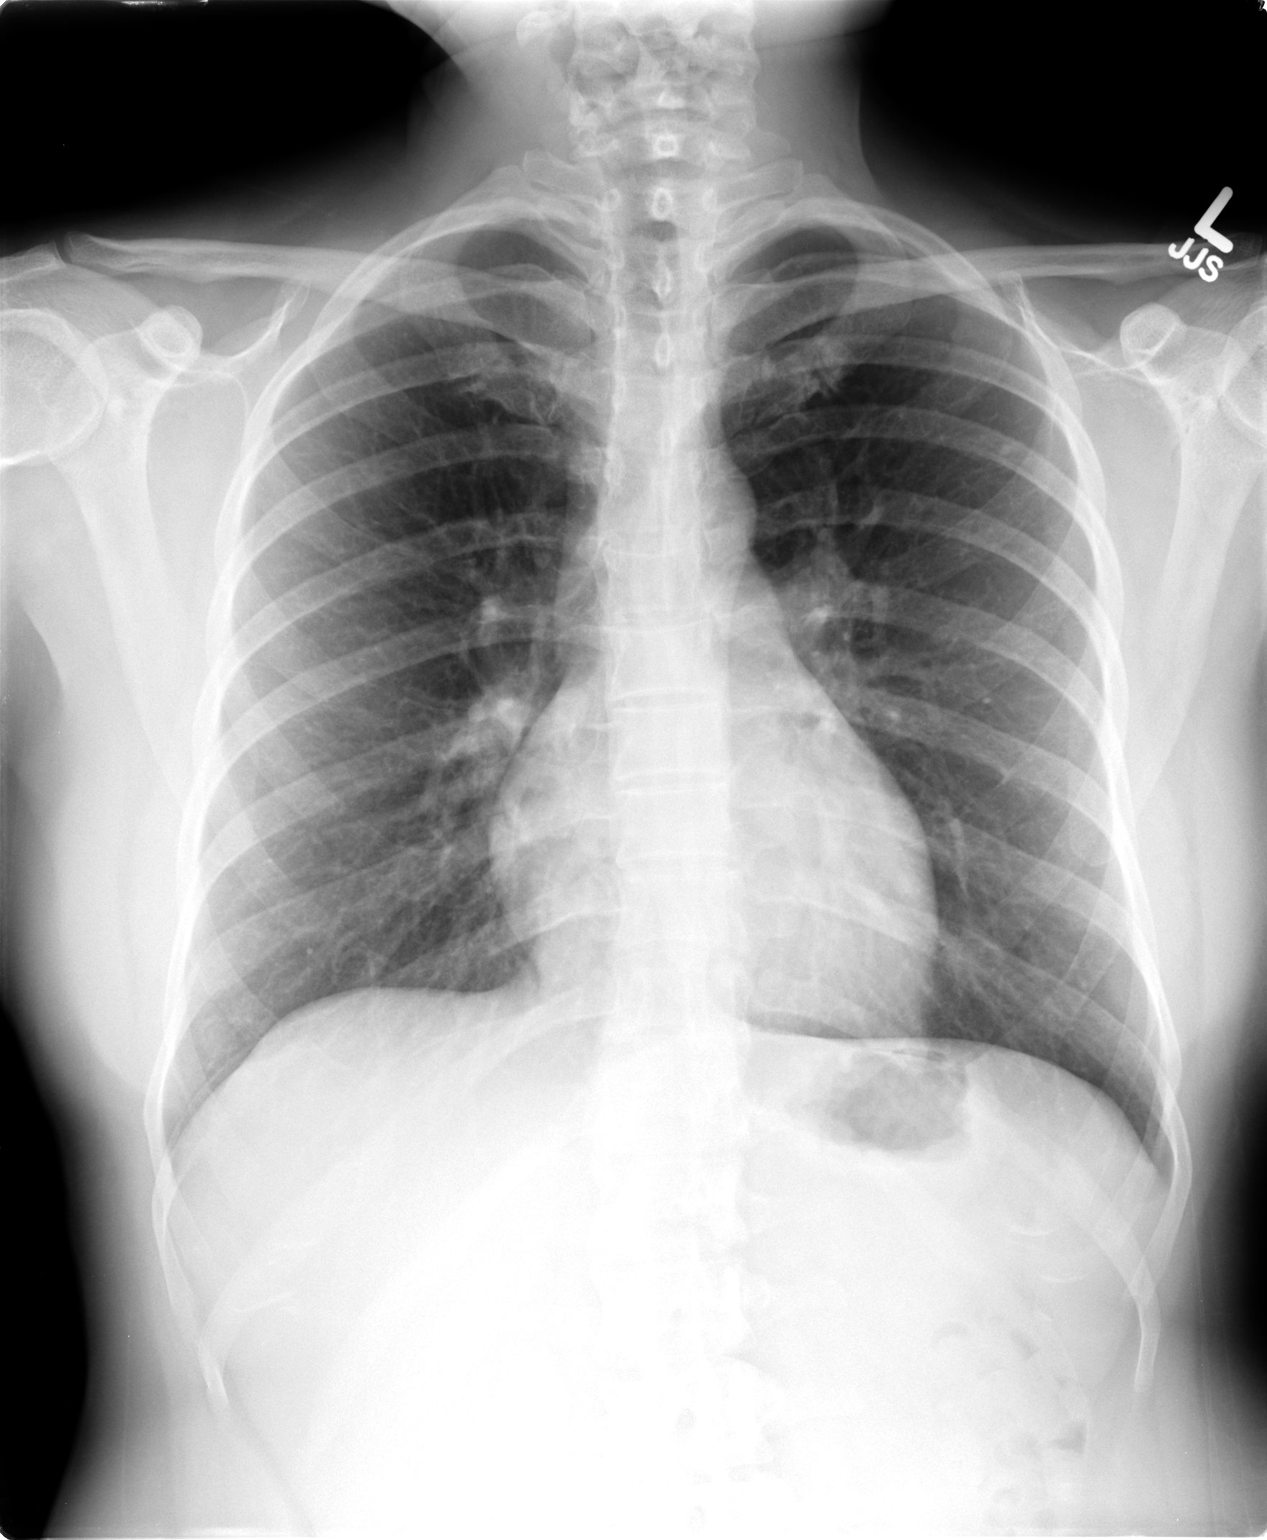

[view not recorded (2 of 2)]
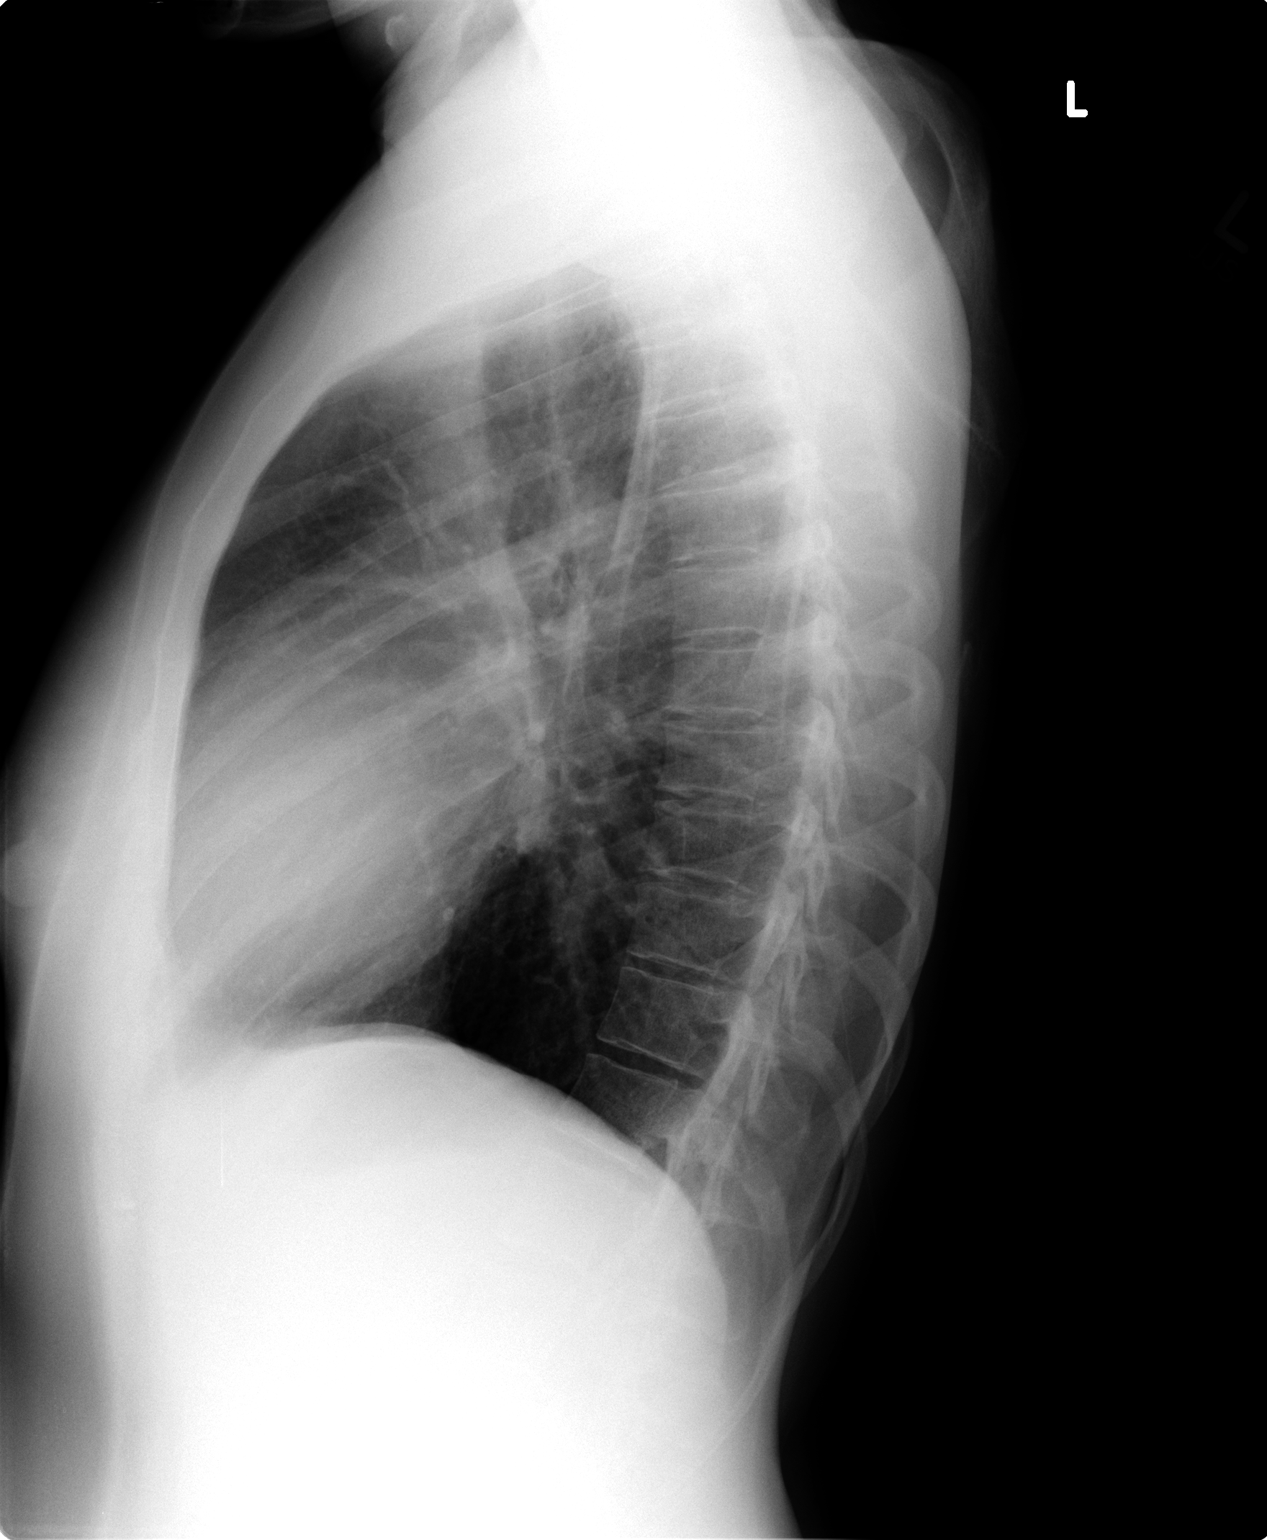

[2 of 2 positions shown; findings below may reference images not displayed]

FINDINGS: Heart and mediastinal contours normal.  Lungs clear.  No
pleural fluid.  Osseous structures and soft tissues unremarkable.
IMPRESSION: No active disease.

## 2011-02-09 ENCOUNTER — Other Ambulatory Visit: Payer: Self-pay | Admitting: Gynecology

## 2011-02-09 DIAGNOSIS — Z1231 Encounter for screening mammogram for malignant neoplasm of breast: Secondary | ICD-10-CM

## 2011-02-13 ENCOUNTER — Ambulatory Visit (HOSPITAL_COMMUNITY)
Admission: RE | Admit: 2011-02-13 | Discharge: 2011-02-13 | Disposition: A | Discharge status: Home / Self Care | Payer: Commercial Managed Care - PPO | Source: Ambulatory Visit | Attending: Gynecology | Admitting: Gynecology

## 2011-02-13 DIAGNOSIS — Z1231 Encounter for screening mammogram for malignant neoplasm of breast: Secondary | ICD-10-CM

## 2012-01-18 ENCOUNTER — Other Ambulatory Visit: Payer: Self-pay | Admitting: Specialist

## 2012-01-18 DIAGNOSIS — R109 Unspecified abdominal pain: Secondary | ICD-10-CM

## 2012-01-20 ENCOUNTER — Ambulatory Visit
Admission: RE | Admit: 2012-01-20 | Discharge: 2012-01-20 | Disposition: A | Discharge status: Home / Self Care | Payer: Commercial Managed Care - PPO | Source: Ambulatory Visit | Attending: Specialist | Admitting: Specialist

## 2012-01-20 DIAGNOSIS — R109 Unspecified abdominal pain: Secondary | ICD-10-CM

## 2012-01-22 ENCOUNTER — Encounter: Payer: Self-pay | Admitting: Gynecology

## 2012-01-22 ENCOUNTER — Ambulatory Visit (INDEPENDENT_AMBULATORY_CARE_PROVIDER_SITE_OTHER): Payer: Commercial Managed Care - PPO | Admitting: Gynecology

## 2012-01-22 VITALS — BP 110/72 | Ht 60.0 in | Wt 118.0 lb

## 2012-01-22 DIAGNOSIS — Z01419 Encounter for gynecological examination (general) (routine) without abnormal findings: Secondary | ICD-10-CM

## 2012-01-22 DIAGNOSIS — N898 Other specified noninflammatory disorders of vagina: Secondary | ICD-10-CM

## 2012-01-22 DIAGNOSIS — E559 Vitamin D deficiency, unspecified: Secondary | ICD-10-CM

## 2012-01-22 LAB — WET PREP FOR TRICH, YEAST, CLUE
WBC, Wet Prep HPF POC: NONE SEEN
Yeast Wet Prep HPF POC: NONE SEEN

## 2012-01-22 NOTE — Patient Instructions (Signed)
Mantenimiento de la salud en las mujeres (Health Maintenance, Females) Un estilo de vida saludable y los cuidados preventivos pueden favorecer la salud y el bienestar.   Haga exmenes regulares de la salud en general, dentales y de los ojos.   Consuma una dieta saludable. Los alimentos como vegetales, frutas, granos enteros, productos lcteos descremados y protenas magras contienen los nutrientes que usted necesita sin necesidad de consumir muchas caloras. Disminuya el consumo de alimentos con alto contenido de grasas slidas, azcar y sal agregadas. Si es necesario, pdaleinformacin acerca de una dieta adecuada a su mdico.   La actividad fsica regular es una de las cosas ms importantes que puede hacer por su salud. Los adultos deben hacer al menos 150 minutos de ejercicios de intensidad moderada (cualquier actividad que aumente la frecuencia cardaca y lo haga transpirar) cada semana. Adems, la mayora de los adultos necesita ejercicios de fortalecimiento muscular 2  ms das por semana.    Mantenga un peso saludable. El ndice de masa corporal (IMC) es una herramienta que identifica posibles problemas con el peso. Proporciona una estimacin de la grasa corporal basndose en el peso y la altura. El mdico podr determinar su IMC y podr ayudarlo a lograr o mantener un peso saludable. Para los adultos de 20 aos o ms:   Un IMC menor a 18,5 se considera bajo peso.   Un IMC entre 18,5 y 24,9 es normal.   Un IMC entre 25 y 29,9 es sobrepeso.   Un IMC entre 30 o ms es obesidad.   Mantenga un nivel normal de lpidos y colesterol en sangre practicando actividad fsica y minimizando la ingesta de grasas saturadas. Consuma una dieta balanceada e incluya variedad de frutas y vegetales. Los anlisis de lpidos y colesterol en sangre deben comenzar a los 20 aos y repetirse cada 5 aos. Si los niveles de colesterol son altos, tiene ms de 50 aos o tiene riesgo elevado de sufrir enfermedades  cardacas, necesitar controlarse con ms frecuencia.Si tiene niveles elevados de lpidos y colesterol, debe recibir tratamiento con medicamentos, si la dieta y el ejercicio no son efectivos.   Si fuma, consulte con el profesional acerca de las opciones para dejar de hacerlo. Si no lo hace, no comience.   Si est embarazada no beba alcohol. Si est amamantando, beba alcohol con prudencia. Si elige beber alcohol, no se exceda de 1 medida por da. Se considera una medida a 12 onzas (355 ml) de cerveza, 5 onzas (148 ml) de vino, o 1,5 onzas (44 ml) de licor.   Evite el alcohol y el consumo de drogas. No comparta agujas. Pida ayuda si necesita asistencia o instrucciones con respecto a abandonar el consumo de alcohol, cigarrillos o drogas.   La hipertensin arterial causa enfermedades cardacas y aumenta el riesgo de ictus. Debe controlar su presin arterial al menos cada 1 o 2 aos. La presin arterial elevada que persiste debe tratarse con medicamentos si la prdida de peso y el ejercicio no son efectivos.   Si tiene entre 55 y 79 aos, consulte a su mdico si debe tomar aspirina para prevenir enfermedades cardacas.   Los anlisis para la diabetes incluyen la toma de una muestra de sangre para controlar el nivel de azcar en la sangre durante el ayuno. Debe hacerlo cada 3 aos despus de los 45 aos si est dentro de su peso normal y sin factores de riesgo para la diabetes. Las pruebas deben comenzar a edades tempranas o llevarse a cabo con ms frecuencia   si tiene sobrepeso y al menos 1 factor de riesgo para la diabetes.   Las evaluaciones para detectar el cncer de mama son un mtodo preventivo fundamental para las mujeres. Debe practicar la "autoconciencia de las mamas". Esto significa que debe reconocer la apariencia normal de sus mamas y como las siente y pudiendo incluir un autoexamen de mamas. Si detecta algn cambio, no importa cun pequeo sea, debe informarlo a su mdico. Las mujeres entre 20 y  40 aos deben hacer un examen clnico de las mamas como parte del examen regular de salud, cada 1 a 3 aos. Despus de los 40 aos deben hacerlo todos los aos. Deben hacerse una mamografa radografa de mamas ) cada ao, comenzando a los 40 aos. Las mujeres con historia familiar de cncer de mama deben hablar con el mdico para hacer un estudio gentico. Las que tienen ms riesgo deben hacerse resonancia magntica y una mamografa todos los aos.   Un test de Pap se realiza para diagnosticar cncer de cuello de tero. Las mujeres deben hacerse un test de Pap a partir de los 21 aos. Entre los 21 y los 29 aos debe repetirse cada dos aos. Luego de los 30 aos, debe realizarse un test de Pap cada tres aos siempre que los 3 estudios anteriores sean normales. Si le han realizado una histerectoma por un problema que no era cncer u otra enfermedad que podra causar cncer, ya no necesitar un test de Pap. Si tiene entre 65 y 70 aos y ha tenido un test de Pap normal en los ltimos 10 aos, ya no ser necesario realizarlo. Si ha recibido un tratamiento para el cncer cervical o para una enfermedad que podra causar cncer, necesitar realizar un test de Pap y controles durante al menos 20 aos de concluir el tratamiento. Si no se ha hecho el examen con regularidad, debern volver a evaluarse los factores de riesgo (como el tener un nuevo compaero sexual) para determinar si debe volver a realizarse los estudios. Algunas mujeres sufren problemas mdicos que aumentan la probabilidad de contraer cncer cervical. En estos casos, el mdico podr indicar que se realice el test de Pap con ms frecuencia.   La prueba del virus del papiloma humano (VPH) es un anlisis adicional que puede usarse para detectar cncer de cuello de tero. Esta prueba busca la presencia del virus que causa los cambios en el cuello. Las clulas que se recolectan durante el test de Pap pueden usarse para el VPH. La prueba para el VPH puede  usarse para evaluar a mujeres de ms de 30 aos y debe usarse en mujeres de cualquier edad cuyos resultados del test de Pap no sean claros. Despus de los 30 aos, las mujeres deben hacerse el anlisis para el VPH con la misma frecuencia que el test de Pap.   El cncer colorectal puede detectarse y con frecuencia puede prevenirse. La mayor parte de los estudios de rutina comienzan a los 50 aos y continan hasta los 75 aos. Sin embargo, el mdico podr aconsejarle que lo haga antes, si tiene factores de riesgo para el cncer de colon. Una vez por ao, el profesional le dar un kit de prueba para hallar sangre oculta en la materia fecal. La utilizacin de un tubo con una pequea cmara en su extremo para examinar directamente el colon (sigmoidoscopa o colonoscopa), puede detectar formas temprana de cncer colorectal. Hable con su mdico si tiene 50 aos, cuando comience con los estudios de rutina. El examen directo del   colon debe repetirse cada 5 a 10 aos, hasta los 75 aos, excepto que se encuentren formas tempranas de plipos precancerosos o pequeos bultos.   Se recomienda realizar un anlisis de sangre para detectar hepatitis C a todas las personas nacidas entre 1945 y 1965, y a todo aquel que tenga un riesgo conocido de haber contrado esta enfermedad.   Practique el sexo seguro. Use condones y evite las prcticas sexuales riesgosas para disminuir el contagio de enfermedades de transmisin sexual. Las mujeres sexualmente activas de 25 aos o menos deben controlarse para descartar clamidia, que es una infeccin de transmisin sexual frecuente. Las mujeres mayores que tengan mltiples compaeros tambin deben hacerse el anlisis para detectar clamidia. Se recomienda realizar anlisis para detectar otras enfermedades de transmisin sexual si es sexualmente activa y tiene riesgos.   La osteoporosis es una enfermedad en la que los huesos pierden los minerales y la fuerza por el avance de la edad. El  resultado pueden ser fracturas graves en los huesos. El riesgo de osteoporosis puede identificarse con una prueba de densidad sea. Las mujeres de ms de 65 aos y las que tengan riesgos de sufrir fracturas u osteoporosis deben pedir consejo a su mdico. Consulte a su mdico si debe tomar un suplemento de calcio o de vitamina D para reducir el riesgo de osteoporosis.   La menopausia se asocia a sntomas y riesgos fsicos. Se dispone de una terapia de reemplazo hormonal para disminuir los sntomas y los riesgos. Consulte a su mdico para saber si la terapia de reemplazo hormonal es conveniente para usted.   Use una pantalla solar con un factor SPF de 30 o mayor. Aplique pantalla de manera libre y repetida a lo largo del da. Pngase al resguardo del sol cuando la sombra sea ms pequea que usted. Protjase usando mangas y pantalones largos, un sombrero de ala ancha y gafas para el sol todo el ao, siempre que se encuentre en el exterior.   Informe a su mdico si aparecen nuevos lunares o los que tiene se modifican, especialmente en forma y color. Tambin notifique al mdico si un lunar es ms grande que el tamao de una goma de lpiz.   Mantngase al da con las vacunas.  Document Released: 05/21/2011 [employer] Patient Information 2012 [employer]. 

## 2012-01-22 NOTE — Progress Notes (Signed)
Samantha Robertson 06-23-1970 [id number]   History:    41 y.o.  for annual gyn exam who states that over the past 3-4 months a few days after. She has a slight discharge with a slight odor. She's had tubal ligation in the past. She is in a monogamous relationship. Her menstrual cycles are reported to be regular otherwise. She's currently being evaluated by her primary physician for left upper quadrant pain and recently had an upper abdominal ultrasound and labs which are pending at time of this dictation. We were able to look at her ultrasound report on Line and it was totally benign. I was not able to retrieve the lab results to share with the patient so she will followup with  her primary physician. She frequently does her self breast examination. She is exercising eating better her BMI now is 23. Her last Pap smear was normal in 2012 she has had normal Pap smears in the past. She has had a past history vitamin D deficiency in the past and did not comply with previous recommendations but she is currently taking vitamin D 2000 units daily. Past medical history,surgical history, family history and social history were all reviewed and documented in the EPIC chart.  Gynecologic History Patient's last menstrual period was 01/05/2012. Contraception: tubal ligation Last Pap: 2012. Results were: normal Last mammogram: 2012. Results were: normal  Obstetric History OB History    Grav Para Term Preterm Abortions TAB SAB Ect Mult Living   3 3 2 1      3      # Outc Date GA Lbr Len/2nd Wgt Sex Del Anes PTL Lv   1 PRE     M SVD  Yes Yes   2 TRM     F CS  No Yes   3 TRM     F CS  No Yes       ROS: A ROS was performed and pertinent positives and negatives are included in the history.  GENERAL: No fevers or chills. HEENT: No change in vision, no earache, sore throat or sinus congestion. NECK: No pain or stiffness. CARDIOVASCULAR: No chest pain or pressure. No palpitations. PULMONARY: No shortness of breath,  cough or wheeze. GASTROINTESTINAL: No abdominal pain, nausea, vomiting or diarrhea, melena or bright red blood per rectum. GENITOURINARY: No urinary frequency, urgency, hesitancy or dysuria. MUSCULOSKELETAL: No joint or muscle pain, no back pain, no recent trauma. DERMATOLOGIC: No rash, no itching, no lesions. ENDOCRINE: No polyuria, polydipsia, no heat or cold intolerance. No recent change in weight. HEMATOLOGICAL: No anemia or easy bruising or bleeding. NEUROLOGIC: No headache, seizures, numbness, tingling or weakness. PSYCHIATRIC: No depression, no loss of interest in normal activity or change in sleep pattern.     Exam: chaperone present  BP 110/72  Ht 5' (1.524 m)  Wt 118 lb (53.524 kg)  BMI 23.05 kg/m2  LMP 01/05/2012  Body mass index is 23.05 kg/(m^2).  General appearance : Well developed well nourished female. No acute distress HEENT: Neck supple, trachea midline, no carotid bruits, no thyroidmegaly Lungs: Clear to auscultation, no rhonchi or wheezes, or rib retractions  Heart: Regular rate and rhythm, no murmurs or gallops Breast:Examined in sitting and supine position were symmetrical in appearance, no palpable masses or tenderness,  no skin retraction, no nipple inversion, no nipple discharge, no skin discoloration, no axillary or supraclavicular lymphadenopathy Abdomen: no palpable masses or tenderness, no rebound or guarding Extremities: no edema or skin discoloration or tenderness  Pelvic:  Bartholin,  Urethra, Skene Glands: Within normal limits             Vagina: No gross lesions or discharge  Cervix: No gross lesions or discharge  Uterus  anteverted, normal size, shape and consistency, non-tender and mobile  Adnexa  Without masses or tenderness  Anus and perineum  normal   Rectovaginal  normal sphincter tone without palpated masses or tenderness             Hemoccult not done   Wet prep: Negative  Assessment/Plan:  41 y.o. female for annual exam who will be  recommended to apply intravaginally probiotic gel such as Luvena twice a week as a vaginal refreshner and to prevent BV and yeast. We discussed the new Pap smear screening guidelines she will not need a Pap smear this year. She was given a requisition to schedule her mammogram. Due to her past history vitamin D deficiency will check her vitamin D level today. She will follow up with her primary physician who is working her up for her left upper quadrant discomfort. Her abdomen exam today was totally benign.    Ok Edwards MD, 10:03 AM 01/22/2012

## 2012-01-23 LAB — VITAMIN D 25 HYDROXY (VIT D DEFICIENCY, FRACTURES): Vit D, 25-Hydroxy: 25 ng/mL — ABNORMAL LOW (ref 30–89)

## 2012-01-26 ENCOUNTER — Encounter: Payer: Self-pay | Admitting: Gynecology

## 2012-03-01 ENCOUNTER — Other Ambulatory Visit: Payer: Self-pay | Admitting: Gynecology

## 2012-03-01 DIAGNOSIS — Z1231 Encounter for screening mammogram for malignant neoplasm of breast: Secondary | ICD-10-CM

## 2012-03-04 ENCOUNTER — Ambulatory Visit (HOSPITAL_COMMUNITY)
Admission: RE | Admit: 2012-03-04 | Discharge: 2012-03-04 | Disposition: A | Discharge status: Home / Self Care | Payer: Commercial Managed Care - PPO | Source: Ambulatory Visit | Attending: Gynecology | Admitting: Gynecology

## 2012-03-04 DIAGNOSIS — Z1231 Encounter for screening mammogram for malignant neoplasm of breast: Secondary | ICD-10-CM | POA: Insufficient documentation

## 2012-03-07 ENCOUNTER — Telehealth: Payer: Self-pay | Admitting: *Deleted

## 2012-03-07 NOTE — Telephone Encounter (Signed)
error 

## 2012-03-09 ENCOUNTER — Other Ambulatory Visit: Payer: Self-pay | Admitting: *Deleted

## 2012-03-09 DIAGNOSIS — R928 Other abnormal and inconclusive findings on diagnostic imaging of breast: Secondary | ICD-10-CM

## 2012-03-18 ENCOUNTER — Ambulatory Visit
Admission: RE | Admit: 2012-03-18 | Discharge: 2012-03-18 | Disposition: A | Discharge status: Home / Self Care | Payer: Commercial Managed Care - PPO | Source: Ambulatory Visit | Attending: Gynecology | Admitting: Gynecology

## 2012-03-18 DIAGNOSIS — R928 Other abnormal and inconclusive findings on diagnostic imaging of breast: Secondary | ICD-10-CM

## 2012-03-18 NOTE — Progress Notes (Signed)
Interpreter [facility]

## 2012-04-24 ENCOUNTER — Ambulatory Visit (INDEPENDENT_AMBULATORY_CARE_PROVIDER_SITE_OTHER): Payer: Commercial Managed Care - PPO | Admitting: Physician Assistant

## 2012-04-24 DIAGNOSIS — Z23 Encounter for immunization: Secondary | ICD-10-CM

## 2012-04-24 NOTE — Progress Notes (Signed)
Here fore Flu vaccine.

## 2013-02-16 ENCOUNTER — Other Ambulatory Visit: Payer: Self-pay | Admitting: Gynecology

## 2013-02-16 DIAGNOSIS — Z1231 Encounter for screening mammogram for malignant neoplasm of breast: Secondary | ICD-10-CM

## 2013-03-02 ENCOUNTER — Ambulatory Visit (INDEPENDENT_AMBULATORY_CARE_PROVIDER_SITE_OTHER): Payer: Commercial Managed Care - PPO | Admitting: Gynecology

## 2013-03-02 ENCOUNTER — Encounter: Payer: Self-pay | Admitting: Gynecology

## 2013-03-02 VITALS — BP 110/70 | Ht 60.0 in | Wt 121.0 lb

## 2013-03-02 DIAGNOSIS — Z01419 Encounter for gynecological examination (general) (routine) without abnormal findings: Secondary | ICD-10-CM

## 2013-03-02 DIAGNOSIS — R252 Cramp and spasm: Secondary | ICD-10-CM

## 2013-03-02 DIAGNOSIS — M542 Cervicalgia: Secondary | ICD-10-CM

## 2013-03-02 DIAGNOSIS — E559 Vitamin D deficiency, unspecified: Secondary | ICD-10-CM

## 2013-03-02 DIAGNOSIS — I83893 Varicose veins of bilateral lower extremities with other complications: Secondary | ICD-10-CM | POA: Insufficient documentation

## 2013-03-02 DIAGNOSIS — Z23 Encounter for immunization: Secondary | ICD-10-CM

## 2013-03-02 LAB — COMPREHENSIVE METABOLIC PANEL
ALT: 17 U/L (ref 0–35)
AST: 14 U/L (ref 0–37)
Albumin: 4.2 g/dL (ref 3.5–5.2)
Alkaline Phosphatase: 51 U/L (ref 39–117)
Glucose, Bld: 90 mg/dL (ref 70–99)
Potassium: 4.4 mEq/L (ref 3.5–5.3)
Sodium: 137 mEq/L (ref 135–145)
Total Protein: 6.7 g/dL (ref 6.0–8.3)

## 2013-03-02 LAB — LIPID PANEL: LDL Cholesterol: 98 mg/dL (ref 0–99)

## 2013-03-02 LAB — CBC WITH DIFFERENTIAL/PLATELET
Basophils Relative: 0 % (ref 0–1)
Hemoglobin: 11.6 g/dL — ABNORMAL LOW (ref 12.0–15.0)
MCHC: 33.4 g/dL (ref 30.0–36.0)
Monocytes Relative: 10 % (ref 3–12)
Neutro Abs: 3.3 10*3/uL (ref 1.7–7.7)
Neutrophils Relative %: 55 % (ref 43–77)
RBC: 3.85 MIL/uL — ABNORMAL LOW (ref 3.87–5.11)

## 2013-03-02 NOTE — Addendum Note (Signed)
Addended by: Bertram Savin A on: 03/02/2013 08:48 AM   Modules accepted: Orders

## 2013-03-02 NOTE — Patient Instructions (Signed)
Vacuna difteria, tétanos, tos ferina (DTP) - Lo que debe saber   (Tetanus, Diphtheria, Pertussis [Tdap] Vaccine, What You Need to Know)  ¿PORQUÉ VACUNARSE?   El tétanos, la difteria y la tos ferina pueden ser enfermedades muy graves, aún en adolescentes y adultos. La vacuna Tdap nos puede proteger de estas enfermedades.   El TÉTANOS (Trismo) provoca la contracción dolorosa de los músculos, por lo general, en todo el cuerpo.   · Puede causar el endurecimiento de los músculos de la cabeza y el cuello, de modo que impide abrir la boca, tragar y en algunos casos, respirar. El tétanos causa la muerte de 1 de cada 5 personas que se infectan.  La DIFTERIA produce la formación de una membrana gruesa que cubre el fondo de la garganta.   · Puede causar problemas respiratorios, parálisis, insuficiencia cardíaca e incluso la muerte.  TOS FERINA (Pertusis) causa episodios de tos graves, que pueden hacer difícil la respiración, causar vómitos y trastornos del sueño.   · También puede ser la causa de pérdida de peso, incontinencia y fractura de costillas. Dos de cada 100 adolescentes y cinco de cada 100 adultos que enferman de pertusis deben ser hospitalizados, tienen complicaciones como la neumonía o mueren.  Estas enfermedades son provocadas por bacterias. La difteria y el pertusis se contagian de persona a persona a través de la tos o el estornudo. El tétanos ingresa al organismo a través de cortes, rasguños o heridas.   Antes de las vacunas, en los [place] se vieron más de 200.000 casos al año de difteria y tos ferina y cientos de casos de tétanos. Desde el inicio de la vacunación, los casos de tétanos y difteria han disminuido alrededor del 99% y los casos de tos ferina alrededor del 80%.   Tdap   La vacuna Tdap protege a adolescentes y adultos contra el tétanos, la difteria y la tos ferina. Una dosis de Tdap se administra a los 11 o 12 años de edad. Las personas que no recibieron la vacuna Tdap a esa edad deben  recibirla tan pronto como sea posible.   Es muy importante que los profesionales de la salud y todos aquellos que tengan contacto cercano con bebés menores de 12 meses reciban la Tdap.   Las mujeres embarazadas deben recibir una dosis de Tdap en cada embarazo, para proteger al recién nacido de la tos ferina. Los niños tienen mayor riesgo de complicaciones graves y potencialmente mortales debido a la tos ferina.   Una vacuna similar, llamada Td, protege contra el tétanos y la difteria, pero no contra la tos ferina. Cada 10 años debe recibirse un refuerzo de Td. La Tdap se puede administrar como uno de estos refuerzos, si todavía no ha recibido una dosis. También se puede aplicar después de un corte o quemadura grave para prevenir la infección por tétanos.   El médico le dará más información.   La Tdap puede administrarse de manera segura simultáneamente con otras vacunas.   ALGUNAS PERSONAS NO DEBEN RECIBIR ESTA VACUNA.   · Si alguna vez tuvo una reacción alérgica potencialmente mortal después de una dosis de la vacuna contra el tétanos, la diferia o la tos ferina, o tuvo una alergia grave a cualquiera de los componentes de esta vacuna, no debe aplicarse la vacuna. Informe a su médico si usted sufre algún tipo de alergia grave.  · Si estuvo en coma o sufrió múltiples convulsiones dentro de los 7 días posteriores después de una dosis de DTP o DTaP   no debe recibir la Tdap, salvo que se encuentre otra causa En este caso puede recibir la Td.  · Consulte con su médico si:  · tiene epilepsia u otra enfermedad del sistema nervioso,  · siente dolor intenso o se hincha después de recibir cualquier vacuna contra la difteria, el tétanos o la tos ferina,  · alguna vez ha sufrido el síndrome de Guillain-Barré,  · no se siente bien el día en que se ha programado la vacuna.  RIESGOS DE UNA REACCIÓN A LA VACUNA  Con cualquier medicamento, incluyendo las vacunas, existe la posibilidad de que aparezcan efectos secundarios. Estos son  leves y desaparecen por sí solos, pero también son posibles las reacciones graves.   Breves episodios de desmayo pueden seguir a una vacunación, causando lesiones por la caída. Sentarse o recostarse durante 15 minutos puede ayudar a evitarlo. Informe al médico si se siente mareado o aturdido, tiene cambios en la visión o zumbidos en los oídos.   Problemas leves luego de la Tdap (no interferirán con las actividades)   · Dolor en el sitio de la inyección (alrededor de 1 de cada 4 adolescentes o 2 de cada 3 adultos).  · Enrojecimiento o hinchazón en el lugar de la inyección (1 de cada 5 personas).  · Fiebre leve de al menos 100,4° F (38° C) (hasta alrededor de 1 cada 25 adolescentes y 1 de cada 100 adultos).  · Dolor de cabeza (3 o 4de cada 10 personas).  · Cansancio (1 de cada 3 o 4 personas).  · Náuseas, vómitos, diarrea, dolor de estómago (1 de cada 4 adolescentes o 1 de cada 10 adultos).  · Escalofríos, dolores corporales, dolor articular, erupciones, inflamación de las glándulas (poco frecuente).  Problemas moderados: (interfieren con las actividades, pero no requieren atención médica)   · Dolor en el lugar de la inyección (1 de cada 5 adolescentes o 1 de cada 100 adultos).  · Enrojecimiento o inflamación (1 de cada 16 adolescentes y 1 de cada 25 adultos).  · Fiebre de más de 102°F o 38,9°C (1 de cada 100 adolescentes o 1 de cada 250 adultos).  · Dolor de cabeza (alrededor de 4 de cada 20 adolescentes y 3 de cada 10 adultos).  · Náuseas, vómitos, diarrea, dolor de estómago (1 a 3 de cada 100 personas).  · Hinchazón de todo el brazo en el que se aplicó la vacuna (3 de cada100 personas).  Problemas graves: luego de la Tdap (no puede realizar las actividades habituales, requiere atención médica)   · Inflamación, dolor intenso, sangrado y enrojecimiento en el brazo, en el sitio de la inyección (poco frecuente).  Una reacción alérgica grave puede ocurrir después de la administración de cualquier vacuna (se estima en  menos de 1 en un millón de dosis).   ¿QUÉ PASA SI HAY UNA REACCIÓN GRAVE?   ¿Qué signos debo buscar?  · Observe todo lo que le preocupe, como signos de una reacción alérgica grave, fiebre muy alta o cambios en el comportamiento.  Los signos de una reacción alérgica grave pueden incluir urticaria, hinchazón de la cara y la garganta, dificultad para respirar, ritmo cardíaco acelerado, mareos y debilidad. Estos síntomas pueden comenzar entre unos pocos minutos y algunas horas después de la vacunación.   ¿Qué debo hacer?  · Si usted piensa que se trata de una reacción alérgica grave o de otra emergencia que no puede esperar, llame al 911 o lleve a la persona al hospital más cercano. De lo contrario, llame   a su médico.  · Después, la reacción debe informarse a la "Vaccine Adverse Event Reporting System" (Sistema de información sobre efectos adversos de las vacunas -VAERS). El médico o usted mismo pueden realizar el informe en el sitio web del VAERS www.vaers.hhs.govo llame al 1-800-822-7967.  El VAERS es sólo para informar reacciones. No brindan consejo médico.   PROGRAMA NACIONAL DE COMPENSACIÓN DE DAÑOS POR VACUNAS   El National Vaccine Injury Compensation Program (VICP) es un programa federal que fue creado para compensar a las personas que puedan haber sufrido daños al recibir ciertas vacunas.   Aquellas personas que consideren que han sufrido un daño como consecuencia de una vacuna y quieren saber más acerca del programa y como presentar una denuncia, pueden llamar 1-800-338-2382 o visite el sitio web del VICP en www.hrsa.gov/vaccinecompensation.   ¿CÓMO PUEDO OBTENER MÁS INFORMACIÓN?   · Consulte a su médico.  · Comuníquese con el servicio de salud de su localidad o su estado.  · Comuníquese con los Centros para el control y la prevención de enfermedades (Centers for Disease Control and Prevention , CDC).  · llamando al 1-800-232-4636 o visitando el sitio web del CDC en www.cdc.gov/vaccines.  CDC Tdap Vaccine VIS  (10/22/11)   Document Released: 05/18/2012  [employer]® Patient Information ©2014 [employer].

## 2013-03-02 NOTE — Progress Notes (Signed)
Samantha Robertson 09-13-1970 [id number]   History:    42 y.o.  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 This is also cause her to have cramps in her legs. She was placed on gabapentin N. Mobic.  The patient stated that in mother father she had 2 light periods but has not had her period in September yet. She's had previous tubal sterilization procedure. She frequently does her breast exam. Mammogram 2013 describe it was normal but dense. Patient would no prior history of abnormal Pap smears. She has had history the past vitamin D deficiency.  Past medical history,surgical history, family history and social history were all reviewed and documented in the EPIC chart.  Gynecologic History Patient's last menstrual period was 02/07/2013. Contraception: tubal ligation Last Pap: 2012. Results were: normal Last mammogram: 2013. Results were: normal but dense  Obstetric History OB History  Gravida Para Term Preterm AB SAB TAB Ectopic Multiple Living  3 3 2 1      3     # Outcome Date GA Lbr Len/2nd Weight Sex Delivery Anes PTL Lv  3 TRM     F CS  N Y  2 TRM     F CS  N Y  1 PRE     M SVD  Y Y       ROS: A ROS was performed and pertinent positives and negatives are included in the history.  GENERAL: No fevers or chills. HEENT: No change in vision, no earache, sore throat or sinus congestion. NECK: spasms. CARDIOVASCULAR: No chest pain or pressure. No palpitations. PULMONARY: No shortness of breath, cough or wheeze. GASTROINTESTINAL: No abdominal pain, nausea, vomiting or diarrhea, melena or bright red blood per rectum. GENITOURINARY: No urinary frequency, urgency, hesitancy or dysuria. MUSCULOSKELETAL: No joint or muscle pain, no back pain, no recent trauma.,leg cramps DERMATOLOGIC: No rash, no itching, no lesions. ENDOCRINE: No polyuria, polydipsia, no heat or cold  intolerance. No recent change in weight. HEMATOLOGICAL: No anemia or easy bruising or bleeding. NEUROLOGIC: No headache, seizures, numbness, tingling or weakness. PSYCHIATRIC: No depression, no loss of interest in normal activity or change in sleep pattern.     Exam: chaperone present  BP 110/70  Ht 5' (1.524 m)  Wt 121 lb (54.885 kg)  BMI 23.63 kg/m2  LMP 02/07/2013  Body mass index is 23.63 kg/(m^2).  General appearance : Well developed well nourished female. No acute distress HEENT: Neck supple, trachea midline, no carotid bruits, no thyroidmegaly, Lungs: Clear to auscultation, no rhonchi or wheezes, or rib retractions  Heart: Regular rate and rhythm, no murmurs or gallops Breast:Examined in sitting and supine position were symmetrical in appearance, no palpable masses or tenderness,  no skin retraction, no nipple inversion, no nipple discharge, no skin discoloration, no axillary or supraclavicular lymphadenopathy Abdomen: no palpable masses or tenderness, no rebound or guarding Extremities:varicose veins bilateral  Pelvic:  Bartholin, Urethra, Skene Glands: Within normal limits             Vagina: No gross lesions or discharge  Cervix: No gross lesions or discharge  Uterus  8-10 weeks' size nontender, normal size, shape and consistency, non-tender and mobile  Adnexa  Without masses or tenderness  Anus and perineum  normal   Rectovaginal  normal sphincter tone without palpated masses or tenderness  Hemoccult none indicated     Assessment/Plan:  42 y.o. female for annual exam with musculoskeletal pains of her neck because of position at work for many hours. 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 She'll be instructed to purchase a support stockings mid thigh to utilize what she is on her feet and when she is at home to rest and with elevation of her feet. We're going to check her vitamin D  level today since she has had history of vitamin D deficiency in the past. Her also to check her pump is metabolic panel, fasting lipid profile, CBC, urinalysis and TSH. She will receive a Tdap vaccine today. She will continue the medication her primary physician provided if she continues with her symptoms she'll return for followup with them. Since she had one isolated event of 2 periods in one month we are going to watch for now. She is going to maintain menstrual calendar for the next 6 month period this becomes a recurrent issue she will return to the office for full endometrial biopsy and sonohysterogram. She was reminded to do her monthly self breast exam.    Ok Edwards MD, 8:35 AM 03/02/2013

## 2013-03-03 LAB — URINALYSIS W MICROSCOPIC + REFLEX CULTURE
Leukocytes, UA: NEGATIVE
Protein, ur: NEGATIVE mg/dL
Urobilinogen, UA: 0.2 mg/dL (ref 0.0–1.0)

## 2013-03-03 LAB — TSH: TSH: 3.891 u[IU]/mL (ref 0.350–4.500)

## 2013-03-03 LAB — VITAMIN D 25 HYDROXY (VIT D DEFICIENCY, FRACTURES): Vit D, 25-Hydroxy: 31 ng/mL (ref 30–89)

## 2013-03-07 ENCOUNTER — Other Ambulatory Visit: Payer: Self-pay | Admitting: Anesthesiology

## 2013-03-07 DIAGNOSIS — E611 Iron deficiency: Secondary | ICD-10-CM

## 2013-03-10 ENCOUNTER — Other Ambulatory Visit: Payer: Self-pay | Admitting: Gynecology

## 2013-03-10 ENCOUNTER — Ambulatory Visit (HOSPITAL_COMMUNITY)
Admission: RE | Admit: 2013-03-10 | Discharge: 2013-03-10 | Disposition: A | Discharge status: Home / Self Care | Payer: Commercial Managed Care - PPO | Source: Ambulatory Visit | Attending: Gynecology | Admitting: Gynecology

## 2013-03-10 DIAGNOSIS — Z1231 Encounter for screening mammogram for malignant neoplasm of breast: Secondary | ICD-10-CM

## 2014-03-17 ENCOUNTER — Encounter: Payer: Self-pay | Admitting: Gastroenterology

## 2014-04-06 ENCOUNTER — Ambulatory Visit (INDEPENDENT_AMBULATORY_CARE_PROVIDER_SITE_OTHER): Payer: Commercial Managed Care - PPO | Admitting: Gynecology

## 2014-04-06 ENCOUNTER — Other Ambulatory Visit (HOSPITAL_COMMUNITY)
Admission: RE | Admit: 2014-04-06 | Discharge: 2014-04-06 | Disposition: A | Discharge status: Home / Self Care | Payer: Commercial Managed Care - PPO | Source: Ambulatory Visit | Attending: Gynecology | Admitting: Gynecology

## 2014-04-06 ENCOUNTER — Encounter: Payer: Self-pay | Admitting: Gynecology

## 2014-04-06 VITALS — BP 122/78 | Ht 60.0 in | Wt 112.0 lb

## 2014-04-06 DIAGNOSIS — Z01419 Encounter for gynecological examination (general) (routine) without abnormal findings: Secondary | ICD-10-CM

## 2014-04-06 DIAGNOSIS — R300 Dysuria: Secondary | ICD-10-CM

## 2014-04-06 DIAGNOSIS — Z113 Encounter for screening for infections with a predominantly sexual mode of transmission: Secondary | ICD-10-CM

## 2014-04-06 DIAGNOSIS — N949 Unspecified condition associated with female genital organs and menstrual cycle: Secondary | ICD-10-CM

## 2014-04-06 DIAGNOSIS — Z8639 Personal history of other endocrine, nutritional and metabolic disease: Secondary | ICD-10-CM

## 2014-04-06 DIAGNOSIS — R102 Pelvic and perineal pain: Secondary | ICD-10-CM

## 2014-04-06 DIAGNOSIS — N3000 Acute cystitis without hematuria: Secondary | ICD-10-CM

## 2014-04-06 DIAGNOSIS — Z23 Encounter for immunization: Secondary | ICD-10-CM

## 2014-04-06 DIAGNOSIS — Z1151 Encounter for screening for human papillomavirus (HPV): Secondary | ICD-10-CM | POA: Diagnosis present

## 2014-04-06 DIAGNOSIS — R634 Abnormal weight loss: Secondary | ICD-10-CM

## 2014-04-06 DIAGNOSIS — N852 Hypertrophy of uterus: Secondary | ICD-10-CM

## 2014-04-06 DIAGNOSIS — N898 Other specified noninflammatory disorders of vagina: Secondary | ICD-10-CM

## 2014-04-06 LAB — URINALYSIS W MICROSCOPIC + REFLEX CULTURE
Bilirubin Urine: NEGATIVE
CASTS: NONE SEEN
CRYSTALS: NONE SEEN
Glucose, UA: NEGATIVE mg/dL
Ketones, ur: NEGATIVE mg/dL
LEUKOCYTES UA: NEGATIVE
NITRITE: NEGATIVE
PH: 6 (ref 5.0–8.0)
Protein, ur: NEGATIVE mg/dL
SPECIFIC GRAVITY, URINE: 1.015 (ref 1.005–1.030)
Urobilinogen, UA: 0.2 mg/dL (ref 0.0–1.0)
WBC, UA: NONE SEEN WBC/hpf (ref <3)

## 2014-04-06 LAB — COMPREHENSIVE METABOLIC PANEL
ALBUMIN: 4.3 g/dL (ref 3.5–5.2)
ALK PHOS: 46 U/L (ref 39–117)
ALT: 10 U/L (ref 0–35)
AST: 15 U/L (ref 0–37)
BUN: 13 mg/dL (ref 6–23)
CO2: 26 mEq/L (ref 19–32)
Calcium: 9 mg/dL (ref 8.4–10.5)
Chloride: 106 mEq/L (ref 96–112)
Creat: 0.71 mg/dL (ref 0.50–1.10)
GLUCOSE: 82 mg/dL (ref 70–99)
POTASSIUM: 4.2 meq/L (ref 3.5–5.3)
SODIUM: 139 meq/L (ref 135–145)
TOTAL PROTEIN: 6.4 g/dL (ref 6.0–8.3)
Total Bilirubin: 0.6 mg/dL (ref 0.2–1.2)

## 2014-04-06 LAB — LIPID PANEL
Cholesterol: 155 mg/dL (ref 0–200)
HDL: 47 mg/dL (ref >39)
LDL CALC: 95 mg/dL (ref 0–99)
Total CHOL/HDL Ratio: 3.3 Ratio
Triglycerides: 66 mg/dL (ref <150)
VLDL: 13 mg/dL (ref 0–40)

## 2014-04-06 LAB — TSH: TSH: 2.869 u[IU]/mL (ref 0.350–4.500)

## 2014-04-06 LAB — CBC WITH DIFFERENTIAL/PLATELET
BASOS PCT: 1 % (ref 0–1)
Basophils Absolute: 0 10*3/uL (ref 0.0–0.1)
Eosinophils Absolute: 0.2 10*3/uL (ref 0.0–0.7)
Eosinophils Relative: 5 % (ref 0–5)
HEMATOCRIT: 37.6 % (ref 36.0–46.0)
Hemoglobin: 12.5 g/dL (ref 12.0–15.0)
LYMPHS PCT: 40 % (ref 12–46)
Lymphs Abs: 1.9 10*3/uL (ref 0.7–4.0)
MCH: 30.3 pg (ref 26.0–34.0)
MCHC: 33.2 g/dL (ref 30.0–36.0)
MCV: 91 fL (ref 78.0–100.0)
MONO ABS: 0.4 10*3/uL (ref 0.1–1.0)
MONOS PCT: 9 % (ref 3–12)
NEUTROS ABS: 2.2 10*3/uL (ref 1.7–7.7)
Neutrophils Relative %: 45 % (ref 43–77)
Platelets: 281 10*3/uL (ref 150–400)
RBC: 4.13 MIL/uL (ref 3.87–5.11)
RDW: 14.7 % (ref 11.5–15.5)
WBC: 4.8 10*3/uL (ref 4.0–10.5)

## 2014-04-06 LAB — WET PREP FOR TRICH, YEAST, CLUE
Clue Cells Wet Prep HPF POC: NONE SEEN
Trich, Wet Prep: NONE SEEN
WBC WET PREP: NONE SEEN
Yeast Wet Prep HPF POC: NONE SEEN

## 2014-04-06 LAB — PREGNANCY, URINE: Preg Test, Ur: NEGATIVE

## 2014-04-06 MED ORDER — SULFAMETHOXAZOLE-TMP DS 800-160 MG PO TABS: 1.0000 | ORAL_TABLET | Freq: Two times a day (BID) | ORAL | Status: DC

## 2014-04-06 NOTE — Progress Notes (Signed)
Samantha Robertson [birth year] [id number]   History:    43 y.o.  who presented to the office today for her annual exam and had several complaints as follows:  #1 weight loss. Patient weight 121 last year weighing today 112 #2 slight vaginal burning with odor and dark discharge along with pruritus #3 dysuria and frequency no fever chills nausea or vomiting #4 occasional dizziness  Patient with past history vitamin D deficiency. Patient reports normal menstrual cycles. Patient with previous tubal sterilization procedure. 2008 ultrasound demonstrated slightly enlarged uterus. Patient is not seen her PCP in over year. No past history of abnormal Pap smears  Past medical history,surgical history, family history and social history were all reviewed and documented in the EPIC chart.  Gynecologic History Patient's last menstrual period was 03/25/2014. Contraception: tubal ligation Last Pap: 2012. Results were: normal Last mammogram: 2013. Results were: normal  Obstetric History OB History  Gravida Para Term Preterm AB SAB TAB Ectopic Multiple Living  3 3 2 1      3     # Outcome Date GA Lbr Len/2nd Weight Sex Delivery Anes PTL Lv  3 TRM     F CS  N Y  2 TRM     F CS  N Y  1 PRE     M SVD  Y Y       ROS: A ROS was performed and pertinent positives and negatives are included in the history.  GENERAL: No fevers or chills. HEENT: No change in vision, no earache, sore throat or sinus congestion. NECK: No pain or stiffness. CARDIOVASCULAR: No chest pain or pressure. No palpitations. PULMONARY: No shortness of breath, cough or wheeze. GASTROINTESTINAL: No abdominal pain, nausea, vomiting or diarrhea, melena or bright red blood per rectum. GENITOURINARY: No urinary frequency, urgency, hesitancy or dysuria. MUSCULOSKELETAL: No joint or muscle pain, no back pain, no recent trauma. DERMATOLOGIC: No rash, no itching, no lesions. ENDOCRINE: No polyuria, polydipsia, no heat or cold intolerance. No  recent change in weight. HEMATOLOGICAL: No anemia or easy bruising or bleeding. NEUROLOGIC: No headache, seizures, numbness, tingling or weakness. PSYCHIATRIC: No depression, no loss of interest in normal activity or change in sleep pattern.     Exam: chaperone present  BP 122/78  Ht 5' (1.524 m)  Wt 112 lb (50.803 kg)  BMI 21.87 kg/m2  LMP 03/25/2014  Body mass index is 21.87 kg/(m^2).  General appearance : Well developed well nourished female. No acute distress HEENT: Neck supple, trachea midline, no carotid bruits, no thyroidmegaly Lungs: Clear to auscultation, no rhonchi or wheezes, or rib retractions  Heart: Regular rate and rhythm, no murmurs or gallops Breast:Examined in sitting and supine position were symmetrical in appearance, no palpable masses or tenderness,  no skin retraction, no nipple inversion, no nipple discharge, no skin discoloration, no axillary or supraclavicular lymphadenopathy Abdomen: no palpable masses or tenderness, no rebound or guarding Extremities: no edema or skin discoloration or tenderness  Pelvic:  Bartholin, Urethra, Skene Glands: Within normal limits             Vagina: No gross lesions or discharge  Cervix: No gross lesions or discharge  Uterus  10 week size slightly tender deviating to patient's left  Adnexa  Without masses or tenderness  Anus and perineum  normal   Rectovaginal  normal sphincter tone without palpated masses or tenderness             Hemoccult not indicated   Wet prep: Negative Urinalysis:  11-20 RBC, or rib or bacteria. Patient's last menstrual period October 11 (12 days ago)   Assessment/Plan:  43 y.o. female for annual exam with possible early signs of urinary tract infection. She will be treated with Septra DS one by mouth twice a day for 7 days. A Pap smear was done today. GC and Chlamydia culture was obtained today. We'll also obtain an HIV along with the following lab results because her weight loss as well: TSH,  comprehensive metabolic panel, CBC, fasting lipid profile, and urinalysis. Because her past history vitamin D deficiency will check her vitamin D level today. Patient will return back next week for an ultrasound due to her pelvic discomfort and enlarging uterus. Urine pregnancy test was obtained today as well. Patient did receive the flu vaccine today. We discussed importance of monthly breast exam. She was provided with a requisition to schedule her mammogram which is overdue.   Terrance Mass MD, 10:43 AM 04/06/2014

## 2014-04-06 NOTE — Patient Instructions (Addendum)
Influenza Virus Vaccine injection (Fluarix) Qu es este medicamento? La VACUNA ANTIGRIPAL ayuda a disminuir el riesgo de contraer la influenza, tambin conocida como la gripe. La vacuna solo ayuda a protegerle contra algunas cepas de influenza. Esta vacuna no ayuda a reducir Catering manager de contraer influenza pandmica H1N1. Este medicamento puede ser utilizado para otros usos; si tiene alguna pregunta consulte con su proveedor de atencin mdica o con su farmacutico. MARCAS COMERCIALES DISPONIBLES: Fluarix, Fluzone Qu le debo informar a mi profesional de la salud antes de tomar este medicamento? Necesita saber si usted presenta alguno de los siguientes problemas o situaciones: -trastorno de sangrado como hemofilia -fiebre o infeccin -sndrome de Guillain-Barre u otros problemas neurolgicos -problemas del sistema inmunolgico -infeccin por el virus de la inmunodeficiencia humana (VIH) o SIDA -niveles bajos de plaquetas en la sangre -esclerosis mltiple -una Risk analyst o inusual a las vacunas antigripales, a los huevos, protenas de pollo, al ltex, a la gentamicina, a otros medicamentos, alimentos, colorantes o conservantes -si est embarazada o buscando quedar embarazada -si est amamantando a un beb Cmo debo utilizar este medicamento? Esta vacuna se administra mediante inyeccin por va intramuscular. Lo administra un profesional de KB Home	[place]. Recibir una copia de informacin escrita sobre la vacuna antes de cada vacuna. Asegrese de leer este folleto cada vez cuidadosamente. Este folleto puede cambiar con frecuencia. Hable con su pediatra para informarse acerca del uso de este medicamento en nios. Puede requerir atencin especial. Sobredosis: Pngase en contacto inmediatamente con un centro toxicolgico o una sala de urgencia si usted cree que haya tomado demasiado medicamento. ATENCIN: [employer] es solo para usted. No comparta este medicamento con nadie. Qu sucede  si me olvido de una dosis? No se aplica en este caso. Qu puede interactuar con este medicamento? -quimioterapia o radioterapia -medicamentos que suprimen el sistema inmunolgico, tales como etanercept, anakinra, infliximab y adalimumab -medicamentos que tratan o previenen cogulos sanguneos, como warfarina -fenitona -medicamentos esteroideos, como la prednisona o la cortisona -teofilina -vacunas Puede ser que esta lista no menciona todas las posibles interacciones. Informe a su profesional de KB Home	[place] de [employer] productos a base de hierbas, medicamentos de [place] o suplementos nutritivos que est tomando. Si usted fuma, consume bebidas alcohlicas o si utiliza drogas ilegales, indqueselo tambin a su profesional de KB Home	[place]. Algunas sustancias pueden interactuar con su medicamento. A qu debo estar atento al usar [employer]? Informe a su mdico o a Barrister's clerk de la [employer] todos los efectos secundarios que persistan despus de 3 das. Llame a su proveedor de atencin mdica si se presentan sntomas inusuales dentro de las 6 semanas posteriores a la vacunacin. Es posible que todava pueda contraer la gripe, pero la enfermedad no ser tan fuerte como normalmente. No puede contraer la gripe de esta vacuna. La vacuna antigripal no le protege contra resfros u otras enfermedades que pueden causar [place]. Debe vacunarse cada ao. Qu efectos secundarios puedo tener al [employer] este medicamento? Efectos secundarios que debe informar a su mdico o a Barrister's clerk de la salud tan pronto como sea posible: -reacciones alrgicas como erupcin cutnea, picazn o urticarias, hinchazn de la cara, labios o lengua Efectos secundarios que, por lo general, no requieren atencin mdica (debe informarlos a su mdico o a su profesional de la salud si persisten o si son molestos): -fiebre -dolor de cabeza -molestias y dolores musculares -dolor, sensibilidad, enrojecimiento o Database administrator de la inyeccin -cansancio o debilidad Puede ser que [employer]  no menciona todos los posibles efectos secundarios. Comunquese a su mdico por asesoramiento mdico [employer] Inc. Usted puede informar los efectos secundarios a la FDA por telfono al 1-800-FDA-1088. Dnde debo guardar mi medicina? Esta vacuna se administra solamente en clnicas, farmacias, consultorio mdico u otro consultorio de un profesional de la salud y no Sports coach en su domicilio. ATENCIN: Este folleto es un resumen. Puede ser que no cubra toda la posible informacin. Si usted tiene preguntas acerca de esta medicina, consulte con su mdico, su farmacutico o su profesional de Technical sales engineer.  2015, Elsevier/Gold Standard. (2009-12-03 15:31:40) Infeccin urinaria  (Urinary Tract Infection)  La infeccin urinaria puede ocurrir en cualquier lugar del tracto urinario. El tracto urinario es un sistema de drenaje del cuerpo por el que se eliminan los desechos y el exceso de [place]. El tracto urinario est formado por dos riones, dos urteres, la vejiga y Geologist, engineering. Los riones son rganos que tienen forma de frijol. Cada rin tiene aproximadamente el tamao del puo. Estn situados debajo de las [place], uno a cada lado de la columna vertebral CAUSAS  La causa de la infeccin son los microbios, que son organismos microscpicos, que incluyen hongos, virus, y bacterias. Estos organismos son tan pequeos que slo pueden verse a travs del microscopio. Las bacterias son los microorganismos que ms comnmente causan infecciones urinarias.  SNTOMAS  Los sntomas pueden variar segn la edad y el sexo del paciente y por la ubicacin de la infeccin. Los sntomas en las mujeres jvenes incluyen la necesidad frecuente e intensa de orinar y una sensacin dolorosa de ardor en la vejiga o en la uretra durante la miccin. Las mujeres y los hombres mayores podrn sentir cansancio, temblores y debilidad y Education officer, museum musculares y Social research officer, government abdominal. Si tiene [place], puede significar que la infeccin est en los riones. Otros sntomas son dolor en la espalda o en los lados debajo de las [place], nuseas y vmitos.  DIAGNSTICO  Para diagnosticar una infeccin urinaria, el mdico le preguntar acerca de sus sntomas. [employer] una [place] de [place]. La muestra de orina se analiza para Hydrographic surveyor bacterias y glbulos blancos de Herbalist. Los glbulos blancos se forman en el organismo para ayudar a Radio broadcast assistant las infecciones.  TRATAMIENTO  Por lo general, las infecciones urinarias pueden tratarse con medicamentos. Debido a que la [employer] Farm de las infecciones son causadas por bacterias, por lo general pueden tratarse con antibiticos. La eleccin del antibitico y la duracin del tratamiento depender de sus sntomas y el tipo de bacteria causante de la infeccin.  INSTRUCCIONES PARA EL CUIDADO EN EL HOGAR   Si le recetaron antibiticos, tmelos exactamente como su mdico le indique. Termine el medicamento aunque se sienta mejor despus de haber tomado slo algunos.  Beba gran cantidad de lquido para mantener la orina de tono claro o color amarillo plido.  Evite la cafena, el t y las bebidas gaseosas. Estas sustancias irritan la vejiga.  Vaciar la vejiga con frecuencia. Evite retener la orina durante largos perodos.  Vace la vejiga antes y despus de Clinical biochemist.  Despus de mover el intestino, las mujeres deben higienizarse la regin perineal desde adelante hacia atrs. Use slo un papel tissue por vez. SOLICITE ATENCIN MDICA SI:   Siente dolor en la espalda.  Le sube la fiebre.  Los sntomas no mejoran luego de 3 das. SOLICITE ATENCIN MDICA DE INMEDIATO SI:   Siente dolor intenso en la espalda o en la zona inferior del  abdomen.  Comienza a sentir escalofros.  Tiene nuseas o vmitos.  Tiene una sensacin continua de quemazn o molestias al [employer]. ASEGRESE  DE QUE:   Comprende estas instrucciones.  Controlar su enfermedad.  Solicitar ayuda de inmediato si no mejora o empeora. Document Released: 03/11/2005 Document Revised: 02/24/2012 [facility] Patient Information 2015 [place]. This information is not intended to replace advice given to you by your health care provider. Make sure you discuss any questions you have with your health care provider.  Ecografa transvaginal (Transvaginal Ultrasound) La ecografa transvaginal es una ecografa plvica en la que se utiliza una probeta metlica que se coloca en la vagina, para observar los rganos femeninos. El ecgrafo enva ondas sonoras desde un transductor (sonda). Estas ondas sonoras chocan contra las estructuras del cuerpo (como un eco) y crean Proofreader. La imagen se observa en un monitor. Se denomina transvaginal debido a que la sonda se inserta dentro de la vagina. Puede haber una pequea molestia por la introduccin de la sonda. Esta prueba tambin puede realizarse [employer] Brands. La ecografa endovaginal es otro nombre para la ecografa transvaginal. En una ecografa transabdominal, la sonda se coloca en la parte externa del abdomen. Este mtodo no ofrece imgenes tan buenas como la tcnica transvaginal. La ecogafa transvaginal se utiliza para observar alteraciones en el tracto genital femenino. Entre ellos se incluyen:  Problemas de infertilidad.  Malformaciones congnitas (defecto de nacimiento) del tero y los ovarios.  Tumores en el tero.  Hemorragias anormales.  Tumores y quistes de ovario.  Abscesos (tejidos inflamados y pus) en la pelvis.  Dolor abdominal o plvico sin causa aparente.  Infecciones plvicas. DURANTE EL EMBARAZO, SE UTILIZA PAR OBSERVAR:  Embarazos normales.  Un embarazo ectpico (embarazo fuera del tero).  Latidos cardacos fetales.  Anormalidades de la pelvis que no se observan bien con la ecografa transabdominal.  Sospecha de gemelos  o embarazo mltiple.  Aborto inminente.  Problemas en el cuello del tero (cuello incompetente, no permanece cerrado para contener al beb).  Cuando se realiza una amniocentesis (se retira lquido de la bolsa [place], para ser [place]).  Al buscar anormalidades en el beb.  Para controlar el crecimiento, el desarrollo y la edad del feto.  Para medir la cantidad de lquido en el saco amnitico.  Cuando se realiza una versin externa del beb (se lo mueve a Programmer, applications).  Evaluar al beb en embarazos de alto riesgo (perfil biofsico).  Si se sospecha el deceso del beb (muerte). En algunos casos, se utiliza un mtodo especial denominado ecografa con infusin salina, para una observacin ms precisa del tero. Se inyecta solucin salina (agua con sal) dentro del tero en pacientes no embarazadas para observar mejor su interior. Este mtodo no se Designer, fashion/clothing. La probeta tambin puede usarse para obtener biopsias de Educational psychologist, para drenar lquido de quistes de ovario y para Designer, jewellery un DIU (dispositivo intrauterino para el control de la natalidad) que no pueda [place]. PREPARACIN PARA LA PRUEBA La ecografa transvaginal se realiza con la vejiga vaca. La ecografa transabdominal se realiza con la vejiga llena. Podrn solicitarle que beba varios vasos de agua antes del examen. En algunos casos se realiza una ecografa transabdominal antes de la ecografa transvaginal para obervar los rganos del abdomen. PROCEDIMIENTO  Deber acostarse en una cama, con las rodillas dobladas y los pies en los estribos. La probeta se cubre con un condn. Dentro de la vagina y en la probeta se aplica un lubricante estril. El  lubricante ayuda a transmitir las [employer] y Fish farm manager la irritacin de la vagina. El mdico mover la sonda en el interior de la cavidad vaginal para escanear las estructuras plvicas. Un examen normal mostrar una pelvis normal y contenidos normales en su  interior. Una prueba anormal mostrar anormalidades en la pelvis, la placenta o el beb. LAS CAUSAS DE UN RESULTADO ANORMAL PUEDEN SER:  Crecimientos o tumores en:  El tero.  Los ovarios.  La vagina.  Otras estructuras plvicas.  Crecimientos no cancerosos en el tero y los ovarios.  El ovario se retuerce y se corta el suministro de Carma [place]air (torsin [place]).  Las reas de infeccin incluyen:  Enfermedad inflamatoria plvica.  Un absceso en la pelvis.  Ubicacin de un DIU. LOS PROBLEMAS QUE PUEDEN HALLARSE EN UNA MUJER EMBARAZADA SON:  Embarazo ectpico (embarazo fuera del tero).  Embarazos mltiples.  Dilatacin (apertura) precoz anormal del cuello del tero. Esto puede indicar un cuello incompetente y Biomedical scientist.  Aborto inminente.  Muerte fetal.  Los problemas con la placenta incluyen:  La placenta se ha desarrollado sobre la abertura del cuello del tero (placenta previa).  La placenta se ha separado anticipadamente en el tero (abrupcin placentaria).  La placenta se desarrolla en el msculo del tero (placenta acreta).  Tumores del Media planner, incluyendo la enfermedad trofoblstica gestacional. Se trata de un embarazo anormal en el que no hay feto. El tero se llena de quistes similares a uvas que en algunos casos son cancerosos.  Posicin incorrecta del feto (de nalgas, de vrtice).  Retraso del desarrollo fetal intrauterino (escaso desarrollo en el tero).  Anormalidades o infeccin fetal. RIESGOS Y COMPLICACIONES No hay riesgos conocidos para la ecografa. No se toman radiografas cuando se realiza una ecografa. Document Released: 09/17/2008 Document Revised: 08/24/2011 [facility] Patient Information 2015 [place]. This information is not intended to replace advice given to you by your health care provider. Make sure you discuss any questions you have with your health care provider.

## 2014-04-07 LAB — URINE CULTURE
COLONY COUNT: NO GROWTH
ORGANISM ID, BACTERIA: NO GROWTH

## 2014-04-07 LAB — GC/CHLAMYDIA PROBE AMP
CT PROBE, AMP APTIMA: NEGATIVE
GC Probe RNA: NEGATIVE

## 2014-04-07 LAB — VITAMIN D 25 HYDROXY (VIT D DEFICIENCY, FRACTURES): VIT D 25 HYDROXY: 26 ng/mL — AB (ref 30–89)

## 2014-04-07 LAB — HIV ANTIBODY (ROUTINE TESTING W REFLEX): HIV: NONREACTIVE

## 2014-04-09 ENCOUNTER — Other Ambulatory Visit: Payer: Self-pay | Admitting: Gynecology

## 2014-04-09 ENCOUNTER — Other Ambulatory Visit: Payer: Self-pay | Admitting: *Deleted

## 2014-04-09 DIAGNOSIS — Z1231 Encounter for screening mammogram for malignant neoplasm of breast: Secondary | ICD-10-CM

## 2014-04-09 DIAGNOSIS — R319 Hematuria, unspecified: Secondary | ICD-10-CM

## 2014-04-09 LAB — CYTOLOGY - PAP

## 2014-04-16 ENCOUNTER — Encounter: Payer: Self-pay | Admitting: Gynecology

## 2014-04-16 ENCOUNTER — Other Ambulatory Visit: Payer: Self-pay | Admitting: Gynecology

## 2014-04-16 ENCOUNTER — Ambulatory Visit (HOSPITAL_COMMUNITY)
Admission: RE | Admit: 2014-04-16 | Discharge: 2014-04-16 | Disposition: A | Discharge status: Home / Self Care | Payer: Commercial Managed Care - PPO | Source: Ambulatory Visit | Attending: Gynecology | Admitting: Gynecology

## 2014-04-16 DIAGNOSIS — Z1231 Encounter for screening mammogram for malignant neoplasm of breast: Secondary | ICD-10-CM

## 2014-04-20 ENCOUNTER — Other Ambulatory Visit: Payer: Self-pay | Admitting: Gynecology

## 2014-04-20 ENCOUNTER — Other Ambulatory Visit: Payer: Commercial Managed Care - PPO

## 2014-04-20 DIAGNOSIS — R319 Hematuria, unspecified: Secondary | ICD-10-CM

## 2014-04-21 LAB — URINALYSIS W MICROSCOPIC + REFLEX CULTURE
Bacteria, UA: NONE SEEN
Bilirubin Urine: NEGATIVE
Casts: NONE SEEN
Crystals: NONE SEEN
Glucose, UA: NEGATIVE mg/dL
Hgb urine dipstick: NEGATIVE
Ketones, ur: NEGATIVE mg/dL
LEUKOCYTES UA: NEGATIVE
NITRITE: NEGATIVE
PROTEIN: NEGATIVE mg/dL
Specific Gravity, Urine: 1.008 (ref 1.005–1.030)
Urobilinogen, UA: 0.2 mg/dL (ref 0.0–1.0)
pH: 6.5 (ref 5.0–8.0)

## 2014-04-25 ENCOUNTER — Ambulatory Visit: Payer: Commercial Managed Care - PPO | Admitting: Gynecology

## 2014-04-25 ENCOUNTER — Other Ambulatory Visit: Payer: Commercial Managed Care - PPO

## 2014-05-03 ENCOUNTER — Other Ambulatory Visit: Payer: Self-pay | Admitting: Gynecology

## 2014-05-03 DIAGNOSIS — R3129 Other microscopic hematuria: Secondary | ICD-10-CM

## 2014-05-07 ENCOUNTER — Ambulatory Visit (INDEPENDENT_AMBULATORY_CARE_PROVIDER_SITE_OTHER): Payer: Commercial Managed Care - PPO | Admitting: Gynecology

## 2014-05-07 ENCOUNTER — Encounter: Payer: Self-pay | Admitting: Gynecology

## 2014-05-07 ENCOUNTER — Ambulatory Visit (INDEPENDENT_AMBULATORY_CARE_PROVIDER_SITE_OTHER): Payer: Commercial Managed Care - PPO

## 2014-05-07 DIAGNOSIS — R1013 Epigastric pain: Secondary | ICD-10-CM

## 2014-05-07 DIAGNOSIS — R102 Pelvic and perineal pain: Secondary | ICD-10-CM

## 2014-05-07 DIAGNOSIS — Z8639 Personal history of other endocrine, nutritional and metabolic disease: Secondary | ICD-10-CM

## 2014-05-07 DIAGNOSIS — N852 Hypertrophy of uterus: Secondary | ICD-10-CM

## 2014-05-07 DIAGNOSIS — G8929 Other chronic pain: Secondary | ICD-10-CM | POA: Insufficient documentation

## 2014-05-07 NOTE — Progress Notes (Signed)
   Patient presented to the office today to discuss her ultrasound. She was seen the office for her annual exam and was noted to have a 10 week size uterus. Patient reports normal menstrual cycles. No intermenstrual bleeding. When she was seen in the office for her annual exam on October 23 she had been complaining of weight loss. She was weighing 112 pounds in the year before she was weighing 121 pounds. Patient with her primary care several weeks ago because of upper abdominal discomfort and stated that she had an upper abdominal ultrasound and was informed it was normal. I could not find a result. She describes her discomfort is midepigastric mostly located in left upper quadrant. She also feels belching at times as well as bloating. She denies any blood in her stools. She has had occasional episodes of dizziness.  On October 23 she had a competence a metabolic panel, fasting lipid profile, CBC, urinalysis, and TSH all in the normal range. An HIV was nonreactive. Her Pap smear was normal as well.  Ultrasound today: Uterus measured 11.0 x 7.9 x 6.7 cm with endometrial stripe of 6.1 mm. Enlarged heterogeneous uterus right and left ovary were normal no apparent adnexal masses no fluid in the cul-de-sac  Ultrasound was compared with previous study in 2009 essentially unchanged.  Assessment/plan: Patient with belching, bloating, midepigastric discomfort especially left upper quadrant with a without meals needs further evaluation by gastroenterologist. Patient has had weight loss since last year. She stated that her sister in her 2s died of some form of liver disease. Patient denies any hematochezia but does suffer time from constipation. We are going to obtain an H. pylori antibody test today. I have asked her to obtain a copy of the ultrasound report and to take with her to the gastroenterologist for further evaluation. She will otherwise be seen for GYN exam next year or when necessary. Indication. At  time of her last blood tests her vitamin D level was found to be slightly below normal with a value of 26. She will be prescribed vitamin D3 2000 units daily.

## 2014-05-07 NOTE — Patient Instructions (Signed)
Anlisis de anticuerpos a Helicobacter Pylori (Helicobacter Pylori Antibodies Test) Se trata de un anlisis de sangre que busca la presencia de una bacteria llamada Helicobacter Pylori. Esto puede diagnosticarse tambin mediante una prueba de [place] o un examen microscpico de una parte (biopsia) del intestino delgado. H. pylori es una bacteria que se encuentra en las clulas que recubren el [place]. Es un factor de riesgo para las Risk manager y en el intestino delgado, inflamacin prolongada del recubrimiento del [place], o incluso lceras que pueden ocurrir en el esfago (la va que va desde la boca al [place]). Esta bacteria es tambin un factor de riesgo de cncer de estmago. La bacteria que se encuentra en, aproximadamente, el 10% de las personas sanas menores de 81 aos de edad, y la cantidad de bacteria aumenta con la edad. La State Farm de las personas que tienen esta bacteria no experimentan sntomas; no obstante, se considera que, cuando esta bacteria ocasiona lceras, se pueden usar antibiticos para eliminar o reducir el problema.  PREPARACIN PARA EL ESTUDIO No se requiere una preparacin especial ni ayunar. HALLAZGOS NORMALES Negativo (no se detect la presencia de la bacteria H-pylori) Los rangos para los resultados normales pueden variar entre diferentes laboratorios y hospitales. Consulte siempre con su mdico despus de Psychologist, counselling estudio para Armed forces logistics/support/administrative officer significado de los [place] y si los valores se consideran "dentro de los lmites normales". SIGNIFICADO DEL ESTUDIO  El mdico leer los resultados y Electrical engineer con usted sobre la importancia y el significado de los [place], as como las opciones de tratamiento y la necesidad de Optometrist pruebas adicionales, si fuera necesario. Es su responsabilidad retirar el resultado del [place]. Consulte en el laboratorio cundo y cmo podr [employer]. OBTENCIN DE LOS RESULTADOS DE LAS PRUEBAS Es su responsabilidad  retirar el resultado del [place]. Consulte en el laboratorio cundo y cmo podr [employer]. Document Released: 03/22/2013 [facility] Patient Information 2015 [place]. This information is not intended to replace advice given to you by your health care provider. Make sure you discuss any questions you have with your health care provider.

## 2014-05-08 LAB — URINALYSIS W MICROSCOPIC + REFLEX CULTURE
Bacteria, UA: NONE SEEN
Bilirubin Urine: NEGATIVE
Casts: NONE SEEN
Crystals: NONE SEEN
Glucose, UA: NEGATIVE mg/dL
Ketones, ur: NEGATIVE mg/dL
LEUKOCYTES UA: NEGATIVE
NITRITE: NEGATIVE
PROTEIN: NEGATIVE mg/dL
Specific Gravity, Urine: 1.026 (ref 1.005–1.030)
Urobilinogen, UA: 0.2 mg/dL (ref 0.0–1.0)
pH: 5.5 (ref 5.0–8.0)

## 2014-05-08 LAB — H. PYLORI ANTIBODY, IGG: H PYLORI IGG: 0.66 {ISR}

## 2014-05-09 LAB — URINE CULTURE: Colony Count: >=100000

## 2014-05-14 ENCOUNTER — Telehealth: Payer: Self-pay | Admitting: *Deleted

## 2014-05-14 DIAGNOSIS — R634 Abnormal weight loss: Secondary | ICD-10-CM

## 2014-05-14 DIAGNOSIS — R1013 Epigastric pain: Secondary | ICD-10-CM

## 2014-05-14 DIAGNOSIS — K5909 Other constipation: Secondary | ICD-10-CM

## 2014-05-14 NOTE — Telephone Encounter (Signed)
-----   Message from Terrance Mass, MD sent at 05/07/2014  1:03 PM EST ----- Please schedule a consultation with GI at [facility] for this patient with weight loss, midepigastric pains and chronic constipation. She prefers a morning appointment. Please remind patient to bring her upper abdominal ultrasound that her PCP had ordered to that appointment with the gastroenterologist. She's been no English.

## 2014-05-14 NOTE — Telephone Encounter (Signed)
Referral placed for GI consult, they will contact pt to schedule.

## 2014-05-25 NOTE — Telephone Encounter (Signed)
appointment 07/18/14 @ 10:45 am

## 2014-07-18 ENCOUNTER — Encounter: Payer: Self-pay | Admitting: Gastroenterology

## 2014-07-18 ENCOUNTER — Ambulatory Visit (INDEPENDENT_AMBULATORY_CARE_PROVIDER_SITE_OTHER): Payer: Commercial Managed Care - PPO | Admitting: Gastroenterology

## 2014-07-18 VITALS — BP 104/60 | HR 68 | Ht 60.25 in | Wt 115.1 lb

## 2014-07-18 DIAGNOSIS — R1013 Epigastric pain: Secondary | ICD-10-CM

## 2014-07-18 DIAGNOSIS — G8929 Other chronic pain: Secondary | ICD-10-CM

## 2014-07-18 DIAGNOSIS — R140 Abdominal distension (gaseous): Secondary | ICD-10-CM

## 2014-07-18 MED ORDER — HYOSCYAMINE SULFATE 0.125 MG SL SUBL: 0.2500 mg | SUBLINGUAL_TABLET | SUBLINGUAL | Status: DC | PRN

## 2014-07-18 NOTE — Patient Instructions (Signed)
You have been scheduled for a gastric emptying scan at [facility] on 08/03/2014 at 9:30am. Please arrive at least 15 minutes prior to your appointment for registration. Please make certain not to have anything to eat or drink after midnight the night before your test. Hold all stomach medications (ex: Zofran, phenergan, Reglan) 48 hours prior to your test. If you need to reschedule your appointment, please contact radiology scheduling at [phone]. _____________________________________________________________________ A gastric-emptying study measures how long it takes for food to move through your stomach. There are several ways to measure stomach emptying. In the most common test, you eat food that contains a small amount of radioactive material. A scanner that detects the movement of the radioactive material is placed over your abdomen to monitor the rate at which food leaves your stomach. This test normally takes about 2 hours to complete.  Take Benefiber daily  We will send in your prescription to your pharmacy _____________________________________________________________________

## 2014-07-18 NOTE — Assessment & Plan Note (Signed)
Two-month history of mild postprandial abdominal fullness.  Symptoms are very nonspecific.  She also complains of nausea.  Doubt active peptic disease.  Endoscopy in 2009 for upper abdominal pain was negative.  Gastroparesis should be ruled out.  It is difficult to say to what degree constipations contributing to her upper abdominal fullness but is also a consideration.  Recommendations #1 gastric empty scan #2 daily fiber supplementation #3 hyomax as needed

## 2014-07-18 NOTE — Progress Notes (Signed)
_                                                                                                                History of Present Illness:  Samantha Robertson is a 44 year old Hispanic female referred for evaluation of abdominal pain.  History was obtained through a translator.  For the last 2 months she's been complaining of postprandial fullness.  When she bends over she has some discomfort in her upper abdomen.  She has also had some nausea.  She complains of constipation characterized by the inability to move her bowels daily.  There is no history of bleeding.  In 2009 she underwent colonoscopy because of complaints of abdominal pain.  Exam was normal.  It has fluctuated but is similar but generally stable over the past 3 years.  Although mobile is listed amongst her medications she denies taking this.   Past Medical History  Diagnosis Date  . Vitamin D deficiency    Past Surgical History  Procedure Laterality Date  . Cesarean section  06/29/06     REPEAT C/S W BTSP  . Tubal ligation  06-29-06    AT TIME OF REPEAT C/S   family history includes Kidney disease in her father and sister; Other in her maternal aunt. There is no history of Colon cancer, Colon polyps, Diabetes, Esophageal cancer, Gallbladder disease, or Heart disease. Current Outpatient Prescriptions  Medication Sig Dispense Refill  . CALCIUM PO Take by mouth.      . cholecalciferol (VITAMIN D) 1000 UNITS tablet Take 1,000 Units by mouth daily.    Marland Kitchen gabapentin (NEURONTIN) 300 MG capsule Take 300 mg by mouth 2 (two) times daily.    . meloxicam (MOBIC) 15 MG tablet Take 15 mg by mouth daily.    . Probiotic Product (RESTORA) CAPS Take 1 capsule by mouth daily.   0  . promethazine (PHENERGAN) 25 MG tablet Take 25 mg by mouth as needed.   0   No current facility-administered medications for this visit.   Allergies as of 07/18/2014  . (No Known Allergies)    reports that she has never smoked. She has never used  smokeless tobacco. She reports that she does not drink alcohol or use illicit drugs.   Review of Systems: Pertinent positive and negative review of systems were noted in the above HPI section. All other review of systems were otherwise negative.  Vital signs were reviewed in today's medical record Physical Exam: General: Well developed , well nourished, no acute distress Skin: anicteric Head: Normocephalic and atraumatic Eyes:  sclerae anicteric, EOMI Ears: Normal auditory acuity Mouth: No deformity or lesions Neck: Supple, no masses or thyromegaly Lungs: Clear throughout to auscultation Heart: Regular rate and rhythm; no murmurs, rubs or bruits Abdomen: Soft, non tender and non distended. No masses, hepatosplenomegaly or hernias noted. Normal Bowel sounds Rectal:deferred Musculoskeletal: Symmetrical with no gross deformities  Skin: No lesions on visible extremities Pulses:  Normal pulses noted Extremities: No clubbing, cyanosis, edema or deformities noted Neurological:  Alert oriented x 4, grossly nonfocal Cervical Nodes:  No significant cervical adenopathy Inguinal Nodes: No significant inguinal adenopathy Psychological:  Alert and cooperative. Normal mood and affect  See Assessment and Plan under Problem List

## 2014-08-03 ENCOUNTER — Encounter (HOSPITAL_COMMUNITY): Payer: Self-pay

## 2014-08-03 ENCOUNTER — Ambulatory Visit (HOSPITAL_COMMUNITY)
Admission: RE | Admit: 2014-08-03 | Discharge: 2014-08-03 | Disposition: A | Discharge status: Home / Self Care | Payer: Commercial Managed Care - PPO | Source: Ambulatory Visit | Attending: Gastroenterology | Admitting: Gastroenterology

## 2014-08-03 DIAGNOSIS — R109 Unspecified abdominal pain: Secondary | ICD-10-CM | POA: Diagnosis not present

## 2014-08-03 DIAGNOSIS — R110 Nausea: Secondary | ICD-10-CM | POA: Diagnosis not present

## 2014-08-03 DIAGNOSIS — R140 Abdominal distension (gaseous): Secondary | ICD-10-CM

## 2014-08-03 DIAGNOSIS — R1013 Epigastric pain: Secondary | ICD-10-CM

## 2014-08-03 MED ORDER — TECHNETIUM TC 99M SULFUR COLLOID
2.2000 | Freq: Once | INTRAVENOUS | Status: AC | PRN
  Administered 2014-08-03: 2.2 via INTRAVENOUS

## 2014-08-06 ENCOUNTER — Other Ambulatory Visit: Payer: Self-pay

## 2014-09-20 ENCOUNTER — Ambulatory Visit (INDEPENDENT_AMBULATORY_CARE_PROVIDER_SITE_OTHER): Payer: Commercial Managed Care - PPO | Admitting: Gastroenterology

## 2014-09-20 ENCOUNTER — Encounter: Payer: Self-pay | Admitting: Gastroenterology

## 2014-09-20 DIAGNOSIS — R1013 Epigastric pain: Secondary | ICD-10-CM | POA: Diagnosis not present

## 2014-09-20 MED ORDER — HYOSCYAMINE SULFATE ER 0.375 MG PO TBCR: EXTENDED_RELEASE_TABLET | ORAL | Status: DC

## 2014-09-20 MED ORDER — OMEPRAZOLE 20 MG PO CPDR: 20.0000 mg | DELAYED_RELEASE_CAPSULE | Freq: Every day | ORAL | Status: DC

## 2014-09-20 NOTE — Progress Notes (Signed)
      History of Present Illness:  Ms. Sacca continues to complain of postprandial upper abdominal discomfort and fullness including the epigastric and left upper quadrants.  It is not clear whether she took hyomax.  She's lost about 5 pounds over the past 3 months.  Gastric empty scan was normal.  She tends to be constipated but claims that bowel movements do not impact on her upper abdominal discomfort.    Review of Systems: Pertinent positive and negative review of systems were noted in the above HPI section. All other review of systems were otherwise negative.    Current Medications, Allergies, Past Medical History, Past Surgical History, Family History and Social History were reviewed in [place] record  Vital signs were reviewed in today's medical record. Physical Exam: General: Well developed , well nourished, no acute distress Skin: anicteric Head: Normocephalic and atraumatic Eyes:  sclerae anicteric, EOMI Ears: Normal auditory acuity Mouth: No deformity or lesions Lungs: Clear throughout to auscultation Heart: Regular rate and rhythm; no murmurs, rubs or bruits Abdomen: Soft, and non distended. No masses, hepatosplenomegaly or hernias noted. Normal Bowel sounds.  There is minimal tenderness in the midepigastrium Rectal:deferred Musculoskeletal: Symmetrical with no gross deformities  Pulses:  Normal pulses noted Extremities: No clubbing, cyanosis, edema or deformities noted Neurological: Alert oriented x 4, grossly nonfocal Psychological:  Alert and cooperative. Normal mood and affect  See Assessment and Plan under Problem List

## 2014-09-20 NOTE — Patient Instructions (Addendum)
You have been scheduled for an endoscopy. Please follow written instructions given to you at your visit today. If you use inhalers (even only as needed), please bring them with you on the day of your procedure. Your physician has requested that you go to www.startemmi.com and enter the access code given to you at your visit today. This web site gives a general overview about your procedure. However, you should still follow specific instructions given to you by our office regarding your preparation for the procedure.  We have sent in your prescription of Hyomax to your pharmacy

## 2014-09-20 NOTE — Assessment & Plan Note (Signed)
Persistent postprandial upper abdominal pain.  Workup including ultrasound gastric imaging scan were negative.  Not certain whether she took hyomax.  Symptoms suggest ulcer or nonulcer dyspepsia.  Recommendations #1 upper endoscopy #2 hyomax for 5 days then as needed #3 begin omeprazole 20 mg daily

## 2014-09-24 ENCOUNTER — Encounter: Payer: Self-pay | Admitting: Gastroenterology

## 2014-10-25 ENCOUNTER — Encounter: Payer: Self-pay | Admitting: Gastroenterology

## 2014-10-25 ENCOUNTER — Ambulatory Visit (AMBULATORY_SURGERY_CENTER): Payer: Commercial Managed Care - PPO | Admitting: Gastroenterology

## 2014-10-25 ENCOUNTER — Other Ambulatory Visit: Payer: Self-pay | Admitting: Gastroenterology

## 2014-10-25 VITALS — BP 100/75 | HR 57 | Temp 97.7°F | Resp 17 | Ht 60.0 in | Wt 120.0 lb

## 2014-10-25 DIAGNOSIS — K269 Duodenal ulcer, unspecified as acute or chronic, without hemorrhage or perforation: Secondary | ICD-10-CM | POA: Diagnosis not present

## 2014-10-25 DIAGNOSIS — R1013 Epigastric pain: Secondary | ICD-10-CM

## 2014-10-25 MED ORDER — SODIUM CHLORIDE 0.9 % IV SOLN: 500.0000 mL | INTRAVENOUS | Status: DC

## 2014-10-25 NOTE — Patient Instructions (Addendum)
YOU HAD AN ENDOSCOPIC PROCEDURE TODAY AT [facility]:   Refer to the procedure report that was given to you for any specific questions about what was found during the examination.  If the procedure report does not answer your questions, please call your gastroenterologist to clarify.  If you requested that your care partner not be given the details of your procedure findings, then the procedure report has been included in a sealed envelope for you to review at your convenience later.  YOU SHOULD EXPECT: Some feelings of bloating in the abdomen. Passage of more gas than usual.  Walking can help get rid of the air that was put into your GI tract during the procedure and reduce the bloating. If you had a lower endoscopy (such as a colonoscopy or flexible sigmoidoscopy) you may notice spotting of blood in your stool or on the toilet paper. If you underwent a bowel prep for your procedure, you may not have a normal bowel movement for a few days.  Please Note:  You might notice some irritation and congestion in your nose or some drainage.  This is from the oxygen used during your procedure.  There is no need for concern and it should clear up in a day or so.  SYMPTOMS TO REPORT IMMEDIATELY:    Following upper endoscopy (EGD)  Vomiting of blood or coffee ground material  New chest pain or pain under the shoulder blades  Painful or persistently difficult swallowing  New shortness of breath  Fever of 100F or higher  Black, tarry-looking stools  For urgent or emergent issues, a gastroenterologist can be reached at any hour by calling [phone].   DIET: Your first meal following the procedure should be a small meal and then it is ok to progress to your normal diet. Heavy or fried foods are harder to digest and may make you feel nauseous or bloated.  Likewise, meals heavy in dairy and vegetables can increase bloating.  Drink plenty of fluids but you should avoid alcoholic beverages  for 24 hours.  ACTIVITY:  You should plan to take it easy for the rest of today and you should NOT DRIVE or use heavy machinery until tomorrow (because of the sedation medicines used during the test).    FOLLOW UP: Our staff will call the number listed on your records the next business day following your procedure to check on you and address any questions or concerns that you may have regarding the information given to you following your procedure. If we do not reach you, we will leave a message.  However, if you are feeling well and you are not experiencing any problems, there is no need to return our call.  We will assume that you have returned to your regular daily activities without incident.  If any biopsies were taken you will be contacted by phone or by letter within the next 1-3 weeks.  Please call us at [phone] if you have not heard about the biopsies in 3 weeks.    SIGNATURES/CONFIDENTIALITY: You and/or your care partner have signed paperwork which will be entered into your electronic medical record.  These signatures attest to the fact that that the information above on your After Visit Summary has been reviewed and is understood.  Full responsibility of the confidentiality of this discharge information lies with you and/or your care-partner.  Continue medications. Await biopsy results. Call and schedule office appointment for 3-4 weeks.    Esofagogastroduodenoscopa - Cuidados  posteriores  (Esophagogastroduodenoscopy, Care After) Siga estas instrucciones durante las prximas semanas. Estas indicaciones le proporcionan informacin general acerca de cmo deber cuidarse despus del procedimiento. El mdico tambin podr darle instrucciones ms especficas. El tratamiento ha sido planificado segn las prcticas mdicas actuales, pero en algunos casos pueden ocurrir complicaciones. Comunquese con el mdico si tiene algn problema o tiene preguntas despus del procedimiento.   INSTRUCCIONES PARA EL CUIDADO EN EL HOGAR   No coma ni beba nada hasta que el medicamento para adormecer (anestsico local) haya desaparecido y su reflejo nauseoso haya vuelto. Usted sabr que el anestsico local se ha eliminado cuando usted pueda tragar cmodamente.  No conduzca vehculos por 12 horas despus de finalizado el procedimiento o segn las indicaciones de su mdico.  Tome slo la medicacin que le indic el profesional. SOLICITE ATENCIN MDICA SI:   No puede dejar de toser.  No puede orinar u orina menos que lo habitual. SOLICITE ATENCIN MDICA DE INMEDIATO SI:   Tiene dificultad para tragar.  No puede comer o beber.  Siente cada vez ms dolor en la garganta o en el pecho.  Se siente mareado, tiene vahdos o se desmaya.  Tiene nuseas o vmitos.  Tiene escalofros.  Tiene fiebre.  Siente un dolor abdominal intenso.  La materia fecal es negra, de aspecto alquitranado o tiene [place]. Document Released: 09/26/2012 [facility] Patient Information 2015 [place]. This information is not intended to replace advice given to you by your health care provider. Make sure you discuss any questions you have with your health care provider.

## 2014-10-25 NOTE — Op Note (Signed)
[place]  [address] [place], [address]   ENDOSCOPY PROCEDURE REPORT  PATIENT: Samantha Robertson, Samantha Robertson  MR#: [id number] BIRTHDATE: 08-20-1970 , 44  yrs. old GENDER: female ENDOSCOPIST: Inda Castle, MD REFERRED BY:  Janith Lima, M.D. PROCEDURE DATE:  10/25/2014 PROCEDURE:  EGD w/ biopsy ASA CLASS:     Class II INDICATIONS:  epigastric pain. MEDICATIONS: Monitored anesthesia care and Propofol 200 mg IV TOPICAL ANESTHETIC:  DESCRIPTION OF PROCEDURE: After the risks benefits and alternatives of the procedure were thoroughly explained, informed consent was obtained.  The LB [id number] endoscope was introduced through the mouth and advanced to the third portion of the duodenum , Without limitations.  The instrument was slowly withdrawn as the mucosa was fully examined.    Findings: Superficial linear ulcer, duodenum.  In the Second Portion the Duodenum Just Superior to the Papilla Was a Large Vertical Fold with a Superficial Linear Ulcer. Except for the findings listed, the EGD was otherwise normal.  Retroflexed views revealed no abnormalities.     The scope was then withdrawn from the patient and the procedure completed.  COMPLICATIONS: There were no immediate complications.  ENDOSCOPIC IMPRESSION: 1.   Superficial linear ulcer, duodenum 2.   EGD was otherwise normal  RECOMMENDATIONS: 1.  Continue current medications 2.  Await biopsy results 3.  Call office next 2-3 days to schedule an office appointment for 3-4 weeks  REPEAT EXAM:  eSigned:  Inda Castle, MD 10/25/2014 3:49 PM    CC:  PATIENT NAME:  Samantha Robertson, Samantha Robertson MR#: [id number]

## 2014-10-25 NOTE — Progress Notes (Signed)
Called to room to assist during endoscopic procedure.  Patient ID and intended procedure confirmed with present staff. Received instructions for my participation in the procedure from the performing physician.  

## 2014-10-25 NOTE — Progress Notes (Signed)
To recovery, report to Myers, RN, VSS. 

## 2014-10-26 ENCOUNTER — Telehealth: Payer: Self-pay | Admitting: *Deleted

## 2014-10-26 NOTE — Telephone Encounter (Signed)
  Follow up Call-  Call back number 10/25/2014  Post procedure Call Back phone  # [phone]  Permission to leave phone message Yes     Patient questions:  Do you have a fever, pain , or abdominal swelling? No. Pain Score  0 *  Have you tolerated food without any problems? Yes.    Have you been able to return to your normal activities? Yes.    Do you have any questions about your discharge instructions: Diet   No. Medications  No. Follow up visit  No.  Do you have questions or concerns about your Care? No.  Actions: * If pain score is 4 or above: No action needed, pain <4.

## 2014-11-02 ENCOUNTER — Encounter: Payer: Self-pay | Admitting: Gastroenterology

## 2014-11-28 ENCOUNTER — Ambulatory Visit (INDEPENDENT_AMBULATORY_CARE_PROVIDER_SITE_OTHER): Payer: Commercial Managed Care - PPO | Admitting: Gastroenterology

## 2014-11-28 ENCOUNTER — Ambulatory Visit: Payer: Commercial Managed Care - PPO | Admitting: Gastroenterology

## 2014-11-28 ENCOUNTER — Encounter: Payer: Self-pay | Admitting: Gastroenterology

## 2014-11-28 DIAGNOSIS — R1013 Epigastric pain: Secondary | ICD-10-CM

## 2014-11-28 MED ORDER — SULINDAC 150 MG PO TABS: ORAL_TABLET | ORAL | Status: DC

## 2014-11-28 MED ORDER — OMEPRAZOLE 20 MG PO CPDR: 20.0000 mg | DELAYED_RELEASE_CAPSULE | Freq: Every day | ORAL | Status: DC

## 2014-11-28 NOTE — Patient Instructions (Signed)
Follow up as needed

## 2014-11-28 NOTE — Assessment & Plan Note (Signed)
At this point I believe her abdominal pain is due to musculoskeletal pain.  Very superficial ulcer in the duodenum is likely incidental.  Recommendations #1 Clinoril 20 mg twice a day for 5 days then when necessary #2 continue omeprazole she is taking Clinoril #3 follow-up with her PCP for musculoskeletal pain

## 2014-11-28 NOTE — Progress Notes (Signed)
      History of Present Illness:  Samantha Robertson has left upper quadrant pain when she bends or lifts.  Relegated to one area in the mid left upper quadrant.  Upper endoscopy demonstrated a superficial linear ulcer in the duodenum.  Biopsies showed inflammatory changes only.  She remains on omeprazole.    Review of Systems: Pertinent positive and negative review of systems were noted in the above HPI section. All other review of systems were otherwise negative.    Current Medications, Allergies, Past Medical History, Past Surgical History, Family History and Social History were reviewed in [place] record  Vital signs were reviewed in today's medical record. Physical Exam: General: Well developed , well nourished, no acute distress On abdominal exam there is mild tenderness in the left upper quadrant that increases with abdominal muscle wall flexion   See Assessment and Plan under Problem List

## 2015-05-24 ENCOUNTER — Encounter: Payer: Self-pay | Admitting: Gynecology

## 2015-05-24 ENCOUNTER — Ambulatory Visit (INDEPENDENT_AMBULATORY_CARE_PROVIDER_SITE_OTHER): Payer: Commercial Managed Care - PPO | Admitting: Gynecology

## 2015-05-24 VITALS — BP 128/86 | Ht 60.0 in | Wt 123.0 lb

## 2015-05-24 DIAGNOSIS — Z01419 Encounter for gynecological examination (general) (routine) without abnormal findings: Secondary | ICD-10-CM | POA: Diagnosis not present

## 2015-05-24 DIAGNOSIS — D251 Intramural leiomyoma of uterus: Secondary | ICD-10-CM

## 2015-05-24 DIAGNOSIS — Z8639 Personal history of other endocrine, nutritional and metabolic disease: Secondary | ICD-10-CM

## 2015-05-24 DIAGNOSIS — N951 Menopausal and female climacteric states: Secondary | ICD-10-CM

## 2015-05-24 DIAGNOSIS — Z23 Encounter for immunization: Secondary | ICD-10-CM | POA: Diagnosis not present

## 2015-05-24 DIAGNOSIS — L292 Pruritus vulvae: Secondary | ICD-10-CM

## 2015-05-24 DIAGNOSIS — N898 Other specified noninflammatory disorders of vagina: Secondary | ICD-10-CM

## 2015-05-24 DIAGNOSIS — N915 Oligomenorrhea, unspecified: Secondary | ICD-10-CM | POA: Diagnosis not present

## 2015-05-24 LAB — COMPREHENSIVE METABOLIC PANEL
ALK PHOS: 60 U/L (ref 33–115)
ALT: 24 U/L (ref 6–29)
AST: 23 U/L (ref 10–30)
Albumin: 4.2 g/dL (ref 3.6–5.1)
BUN: 12 mg/dL (ref 7–25)
CALCIUM: 9 mg/dL (ref 8.6–10.2)
CO2: 26 mmol/L (ref 20–31)
Chloride: 104 mmol/L (ref 98–110)
Creat: 0.6 mg/dL (ref 0.50–1.10)
GLUCOSE: 94 mg/dL (ref 65–99)
POTASSIUM: 4.2 mmol/L (ref 3.5–5.3)
Sodium: 139 mmol/L (ref 135–146)
TOTAL PROTEIN: 6.7 g/dL (ref 6.1–8.1)
Total Bilirubin: 0.4 mg/dL (ref 0.2–1.2)

## 2015-05-24 LAB — LIPID PANEL
CHOL/HDL RATIO: 3.3 ratio (ref <=5.0)
Cholesterol: 172 mg/dL (ref 125–200)
HDL: 52 mg/dL (ref >=46)
LDL Cholesterol: 103 mg/dL (ref <130)
Triglycerides: 85 mg/dL (ref <150)
VLDL: 17 mg/dL (ref <30)

## 2015-05-24 LAB — CBC WITH DIFFERENTIAL/PLATELET
BASOS ABS: 0 10*3/uL (ref 0.0–0.1)
Basophils Relative: 1 % (ref 0–1)
Eosinophils Absolute: 0.1 10*3/uL (ref 0.0–0.7)
Eosinophils Relative: 3 % (ref 0–5)
HEMATOCRIT: 38.4 % (ref 36.0–46.0)
HEMOGLOBIN: 12.7 g/dL (ref 12.0–15.0)
LYMPHS ABS: 1.8 10*3/uL (ref 0.7–4.0)
Lymphocytes Relative: 43 % (ref 12–46)
MCH: 30.5 pg (ref 26.0–34.0)
MCHC: 33.1 g/dL (ref 30.0–36.0)
MCV: 92.1 fL (ref 78.0–100.0)
MPV: 10.1 fL (ref 8.6–12.4)
Monocytes Absolute: 0.3 10*3/uL (ref 0.1–1.0)
Monocytes Relative: 8 % (ref 3–12)
NEUTROS ABS: 1.8 10*3/uL (ref 1.7–7.7)
NEUTROS PCT: 45 % (ref 43–77)
Platelets: 298 10*3/uL (ref 150–400)
RBC: 4.17 MIL/uL (ref 3.87–5.11)
RDW: 14.7 % (ref 11.5–15.5)
WBC: 4.1 10*3/uL (ref 4.0–10.5)

## 2015-05-24 LAB — TSH: TSH: 3.736 u[IU]/mL (ref 0.350–4.500)

## 2015-05-24 LAB — WET PREP FOR TRICH, YEAST, CLUE
Clue Cells Wet Prep HPF POC: NONE SEEN
TRICH WET PREP: NONE SEEN
WBC, Wet Prep HPF POC: NONE SEEN
Yeast Wet Prep HPF POC: NONE SEEN

## 2015-05-24 NOTE — Addendum Note (Signed)
Addended by: Thurnell Garbe A on: 05/24/2015 12:01 PM   Modules accepted: Orders, SmartSet

## 2015-05-24 NOTE — Progress Notes (Signed)
Samantha Robertson [birth year] [id number]   History:    44 y.o.  for annual gyn exam who was complaining of a questionable bump on her external genitalia and some slight pruritus. Patient states she is in monogamous relationship area last year she had a negative STD screening. She has lost some weight last year and now she has regained it. Last year she was weighing 112 and she is now up to 123. Patient requesting her flu vaccine today. Patient with history of fibroid uterus. Patient now stating that sometimes she skips 1 or 2 months of her menstrual cycle. She denies any nipple discharge, unusual headaches or any visual disturbances. She had a tubal ligation many years ago. Patient also had many years ago history of vitamin D deficiency. She had a colonoscopy in 2009 which was negative as a result of her abdominal pains that she was experiencing at that time.  Past medical history,surgical history, family history and social history were all reviewed and documented in the EPIC chart.  Gynecologic History Patient's last menstrual period was 04/13/2015. Contraception: tubal ligation Last Pap: 2015. Results were: normal Last mammogram: 2015. Results were: normal  Obstetric History OB History  Gravida Para Term Preterm AB SAB TAB Ectopic Multiple Living  3 3 2 1      3     # Outcome Date GA Lbr Len/2nd Weight Sex Delivery Anes PTL Lv  3 Term     F CS-Unspec  N Y  2 Term     F CS-Unspec  N Y  1 Preterm     M Vag-Spont  Y Y       ROS: A ROS was performed and pertinent positives and negatives are included in the history.  GENERAL: No fevers or chills. HEENT: No change in vision, no earache, sore throat or sinus congestion. NECK: No pain or stiffness. CARDIOVASCULAR: No chest pain or pressure. No palpitations. PULMONARY: No shortness of breath, cough or wheeze. GASTROINTESTINAL: No abdominal pain, nausea, vomiting or diarrhea, melena or bright red blood per rectum. GENITOURINARY: No urinary  frequency, urgency, hesitancy or dysuria. MUSCULOSKELETAL: No joint or muscle pain, no back pain, no recent trauma. DERMATOLOGIC: No rash, no itching, no lesions. ENDOCRINE: No polyuria, polydipsia, no heat or cold intolerance. No recent change in weight. HEMATOLOGICAL: No anemia or easy bruising or bleeding. NEUROLOGIC: No headache, seizures, numbness, tingling or weakness. PSYCHIATRIC: No depression, no loss of interest in normal activity or change in sleep pattern.     Exam: chaperone present  BP 128/86 mmHg  Ht 5' (1.524 m)  Wt 123 lb (55.792 kg)  BMI 24.02 kg/m2  LMP 04/13/2015  Body mass index is 24.02 kg/(m^2).  General appearance : Well developed well nourished female. No acute distress HEENT: Eyes: no retinal hemorrhage or exudates,  Neck supple, trachea midline, no carotid bruits, no thyroidmegaly Lungs: Clear to auscultation, no rhonchi or wheezes, or rib retractions  Heart: Regular rate and rhythm, no murmurs or gallops Breast:Examined in sitting and supine position were symmetrical in appearance, no palpable masses or tenderness,  no skin retraction, no nipple inversion, no nipple discharge, no skin discoloration, no axillary or supraclavicular lymphadenopathy Abdomen: no palpable masses or tenderness, no rebound or guarding Extremities: no edema or skin discoloration or tenderness  Pelvic:  Bartholin, Urethra, Skene Glands: Within normal limits             Vagina: No gross lesions or discharge  Cervix: No gross lesions or discharge  Uterus  10 week size  Adnexa  Without masses or tenderness  Anus and perineum  normal   Rectovaginal  normal sphincter tone without palpated masses or tenderness             Hemoccult not indicated   Wet prep negative GC and Chlamydia culture obtained pending  Assessment/Plan:  44 y.o. female for annual exam will have the following blood work done today: Comprehensive metabolic panel, fasting lipid profile, TSH, CBC, and urinalysis. Because  of her history vitamin D deficiency will check a vitamin D level. Because of her oligomenorrhea we'll check her prolactin level and in the event of being premenopausal was going to check an [facility] today as well. The area that she was concerned about an external genitalia appears to be a small sebaceous cyst which does not need any treatment. Was nonerythematous and nontender she was reassured.   Terrance Mass MD, 11:07 AM 05/24/2015

## 2015-05-25 LAB — URINALYSIS W MICROSCOPIC + REFLEX CULTURE
BILIRUBIN URINE: NEGATIVE
Bacteria, UA: NONE SEEN [HPF]
CRYSTALS: NONE SEEN [HPF]
Casts: NONE SEEN [LPF]
Glucose, UA: NEGATIVE
Ketones, ur: NEGATIVE
LEUKOCYTES UA: NEGATIVE
NITRITE: NEGATIVE
PH: 6 (ref 5.0–8.0)
Protein, ur: NEGATIVE
SPECIFIC GRAVITY, URINE: 1.01 (ref 1.001–1.035)
WBC, UA: NONE SEEN WBC/HPF (ref <=5)
Yeast: NONE SEEN [HPF]

## 2015-05-25 LAB — VITAMIN D 25 HYDROXY (VIT D DEFICIENCY, FRACTURES): VIT D 25 HYDROXY: 16 ng/mL — AB (ref 30–100)

## 2015-05-25 LAB — GC/CHLAMYDIA PROBE AMP
CT PROBE, AMP APTIMA: NOT DETECTED
GC Probe RNA: NOT DETECTED

## 2015-05-25 LAB — FOLLICLE STIMULATING HORMONE: FSH: 5.4 m[IU]/mL

## 2015-05-25 LAB — PROLACTIN: PROLACTIN: 7.1 ng/mL

## 2015-05-28 LAB — URINE CULTURE: Colony Count: 50000

## 2015-06-03 ENCOUNTER — Other Ambulatory Visit: Payer: Self-pay | Admitting: Gynecology

## 2015-06-03 DIAGNOSIS — E559 Vitamin D deficiency, unspecified: Secondary | ICD-10-CM

## 2015-06-03 MED ORDER — VITAMIN D (ERGOCALCIFEROL) 1.25 MG (50000 UNIT) PO CAPS: 50000.0000 [IU] | ORAL_CAPSULE | ORAL | Status: DC

## 2015-06-05 ENCOUNTER — Other Ambulatory Visit: Payer: Self-pay | Admitting: Anesthesiology

## 2015-06-05 MED ORDER — NITROFURANTOIN MONOHYD MACRO 100 MG PO CAPS: 100.0000 mg | ORAL_CAPSULE | Freq: Two times a day (BID) | ORAL | Status: DC

## 2015-06-21 ENCOUNTER — Other Ambulatory Visit: Payer: Self-pay | Admitting: Gynecology

## 2015-06-21 ENCOUNTER — Ambulatory Visit (INDEPENDENT_AMBULATORY_CARE_PROVIDER_SITE_OTHER): Payer: Commercial Managed Care - PPO

## 2015-06-21 ENCOUNTER — Ambulatory Visit (INDEPENDENT_AMBULATORY_CARE_PROVIDER_SITE_OTHER): Payer: Commercial Managed Care - PPO | Admitting: Gynecology

## 2015-06-21 VITALS — BP 120/76

## 2015-06-21 DIAGNOSIS — N915 Oligomenorrhea, unspecified: Secondary | ICD-10-CM

## 2015-06-21 DIAGNOSIS — E559 Vitamin D deficiency, unspecified: Secondary | ICD-10-CM | POA: Diagnosis not present

## 2015-06-21 DIAGNOSIS — N852 Hypertrophy of uterus: Secondary | ICD-10-CM

## 2015-06-21 DIAGNOSIS — D251 Intramural leiomyoma of uterus: Secondary | ICD-10-CM

## 2015-06-21 NOTE — Patient Instructions (Signed)
Deficiencia de vitaminaD (Vitamin D Deficiency) La deficiencia de vitaminaD ocurre cuando el organismo no tiene suficiente vitaminaD. La vitaminaD es importante para el organismo por muchos motivos:  Ayuda al organismo a Engineer, production minerales llamados calcio y fsforo.  Desempea un papel en la salud de los [place].  Puede ayudar a prevenir [employer], como la diabetes y Curator.  Desempea un papel en la funcin muscular, incluida la actividad cardaca. Para obtener vitaminaD, puede hacer lo siguiente:  Consuma alimentos que contengan naturalmente vitaminaD.  Consuma o beba leche u otros productos lcteos con agregado de vitaminaD.  Tome un suplemento de vitaminaD o un suplemento multivitamnico que la contenga.  Expngase al sol. El organismo produce vitaminaD de forma natural cuando se expone la piel a la luz del sol. El organismo transforma la luz del sol en una forma de vitamina que puede [place]. Si la deficiencia de vitaminaD es grave, puede causar una enfermedad que [employer]. En los adultos, se la conoce como osteomalacia. En los nios, recibe el nombre de raquitismo. CAUSAS La deficiencia de vitaminaD puede deberse a lo siguiente:  No comer suficientes alimentos que contengan vitamina D.  Exposicin insuficiente al sol.  Sufrir Actuary del sistema digestivo que le dificultan al organismo la absorcin de vitaminaD. Estas enfermedades incluyen la enfermedad de Crohn, la pancreatitis crnica y la fibrosis [place].  Someterse a una [place] se extirpa Ardelia Mems parte del estmago o del intestino delgado.  Ser obeso.  Tener enfermedad renal crnica o enfermedad heptica. FACTORES DE RIESGO Es ms probable que esta afeccin se manifieste en:  Las personas de edad [place].  Las [employer] no pasan mucho tiempo al [employer].  Las personas que viven en un centro de atencin de Building services engineer.  Las  personas que sufrieron fracturas seas.  Las personas que tienen huesos dbiles o delgados (osteoporosis).  Las personas que sufren una enfermedad o un trastorno que modifica la forma en que el organismo absorbe la vitaminaD.  Las personas de [employer].  Las personas que toman algunos medicamentos, como corticoides o determinados anticonvulsivos.  Las personas con sobrepeso u obesidad. SNTOMAS En los casos leves de deficiencia de vitaminaD, puede no haber sntomas. Si el trastorno es grave, los sntomas pueden incluir lo siguiente:  [employer].  Dolor muscular.  Caerse con frecuencia.  Huesos fracturados por Primary school teacher. DIAGNSTICO Por lo general, este trastorno se diagnostica mediante un anlisis de [place].  TRATAMIENTO El tratamiento de este trastorno puede depender de la causa. Entre las otras opciones de [place], se incluyen las siguientes:  Tomar suplementos de vitamina D.  Tomar un suplemento de calcio. El Statistician cul es la dosis ms [place] para usted. [place] los medicamentos y los suplementos solamente como se lo haya indicado el mdico.  Consuma alimentos que contengan vitaminaD, como por ejemplo:  Productos lcteos, cereales o jugos fortificados. El trmino fortificado significa que se ha aadido vitaminaD al [employer]. Revise la etiqueta del paquete para estar seguro.  Pescados grasos, como el salmn o la [place].  Huevos.  Ostras.  No utilice camas solares.  Mantenga un peso saludable. Baje de peso, si es necesario.  Concurra a todas las visitas de control como se lo haya indicado el mdico. Esto es importante. SOLICITE ATENCIN MDICA SI:  Los sntomas no desaparecen.  Tiene malestar estomacal (nuseas) o vomita.  Defeca menos que lo habitual  o le resulta difcil defecar (estreimiento).   Esta informacin no tiene Marine scientist el consejo del mdico. Asegrese de  hacerle al mdico cualquier pregunta que tenga.   Document Released: 08/24/2011 Document Revised: 02/20/2015 Elsevier Interactive Patient Education [employer].

## 2015-06-21 NOTE — Progress Notes (Signed)
   Patient is a 45 year old was seen the office for her annual exam on December 9. Patient with questionable past history of fibroid uterus. Patient also here to discuss her recent blood work. Patient also with history of oligomenorrhea. Her recent lab work indicated she had a normal FSH, prolactin, and TSH. She had a normal comprehensive metabolic panel, fasting lipid profile, CBC, and urinalysis. Her Pap smear was done in 2015 which was normal. She was found to have vitamin D deficiency For which her vitamin D level was down to 16. She was started on vitamin D 50,000 units every weekly for 12 weeks. Patient sent to return to the office for vitamin D level. Patient was otherwise asymptomatic today. She is overdue for mammogram her last mammogram was in 2011.  Ultrasound today: Uterus measured 11.8 x 7.8 x 9 cm endometrial stripe 12.9 mm (last menstrual cycle 05/27/2015). No fibroids were seen. Right ovary normal. Left ovarian follicle echo-free measuring 17 x 12 mm. Cervix nabothian cysts. No fluid in the cul-de-sac.  Assessment/plan: Patient premenopausal I've given her prescription of Provera to take 10 mg 1 by mouth daily for 10 days if she does not have a spontaneous menses every 45 days. Patient with previous tubal ligation. Normal ultrasound today. Patient was reminded to return to the office in 3 months after completion her vitamin D deficiency treatment for vitamin D level check. Afterwards I would like her to continue taking vitamin D3 2000 units thereafter.

## 2015-06-25 ENCOUNTER — Other Ambulatory Visit: Payer: Self-pay

## 2015-06-25 DIAGNOSIS — Z1231 Encounter for screening mammogram for malignant neoplasm of breast: Secondary | ICD-10-CM

## 2015-07-12 ENCOUNTER — Ambulatory Visit
Admission: RE | Admit: 2015-07-12 | Discharge: 2015-07-12 | Disposition: A | Discharge status: Home / Self Care | Payer: Commercial Managed Care - PPO | Source: Ambulatory Visit

## 2015-07-12 DIAGNOSIS — Z1231 Encounter for screening mammogram for malignant neoplasm of breast: Secondary | ICD-10-CM

## 2015-07-15 ENCOUNTER — Other Ambulatory Visit: Payer: Self-pay | Admitting: Gynecology

## 2015-07-15 DIAGNOSIS — R928 Other abnormal and inconclusive findings on diagnostic imaging of breast: Secondary | ICD-10-CM

## 2015-08-16 ENCOUNTER — Ambulatory Visit
Admission: RE | Admit: 2015-08-16 | Discharge: 2015-08-16 | Disposition: A | Discharge status: Home / Self Care | Payer: Commercial Managed Care - PPO | Source: Ambulatory Visit | Attending: Gynecology | Admitting: Gynecology

## 2015-08-16 DIAGNOSIS — R928 Other abnormal and inconclusive findings on diagnostic imaging of breast: Secondary | ICD-10-CM

## 2015-08-19 ENCOUNTER — Other Ambulatory Visit: Payer: Commercial Managed Care - PPO

## 2016-04-29 ENCOUNTER — Encounter: Payer: Self-pay | Admitting: Family Medicine

## 2016-04-29 ENCOUNTER — Ambulatory Visit (INDEPENDENT_AMBULATORY_CARE_PROVIDER_SITE_OTHER): Payer: Commercial Managed Care - PPO | Admitting: Family Medicine

## 2016-04-29 VITALS — BP 110/68 | HR 64 | Temp 98.3°F | Resp 18 | Ht 60.0 in | Wt 119.0 lb

## 2016-04-29 DIAGNOSIS — H9202 Otalgia, left ear: Secondary | ICD-10-CM | POA: Diagnosis not present

## 2016-04-29 DIAGNOSIS — R0981 Nasal congestion: Secondary | ICD-10-CM

## 2016-04-29 DIAGNOSIS — Z23 Encounter for immunization: Secondary | ICD-10-CM

## 2016-04-29 MED ORDER — FLUTICASONE PROPIONATE 50 MCG/ACT NA SUSP: 2.0000 | Freq: Every day | NASAL | 6 refills | Status: DC

## 2016-04-29 NOTE — Patient Instructions (Addendum)
IF you received an x-ray today, you will receive an invoice from [facility]. Please contact [facility] at [phone] with questions or concerns regarding your invoice.   IF you received labwork today, you will receive an invoice from [facility]. Please contact [facility] at [phone] with questions or concerns regarding your invoice.   Our billing staff will not be able to assist you with questions regarding bills from these companies.  You will be contacted with the lab results as soon as they are available. The fastest way to get your results is to activate your My Chart account. Instructions are located on the last page of this paperwork. If you have not heard from us regarding the results in 2 weeks, please contact this office.     Dolor de odos (Earache) El dolor de odos, tambin llamado otalgia, puede tener muchas causas. Puede ser agudo, sordo o ardiente, transitorio o permanente. Los dolores de odos pueden deberse a problemas de los odos, como infecciones en el odo medio o el conducto auditivo externo, lesiones, tapones de cera, presin en el odo medio o un cuerpo extrao en el odo. Tambin, a problemas en otras zonas, lo que se conoce como dolor referido. Por ejemplo, el dolor puede deberse a una faringitis, una infeccin dental o problemas de la mandbula o de la articulacin que se encuentra entre la mandbula y el crneo (articulacin temporomandibular o ATM). No siempre es fcil identificar la causa de un dolor de odos. La observacin cautelosa puede ser la conducta adecuada en el caso de algunos dolores de odos, hasta tanto se descubra una causa clara. INSTRUCCIONES PARA EL CUIDADO EN EL HOGAR Controle su afeccin para ver si hay cambios. Las siguientes medidas pueden servir para aliviar cualquier molestia que est sintiendo:  Tome los medicamentos solamente como se lo haya indicado el mdico. Esto incluye la  aplicacin de las gotas ticas.  Pngase hielo en la oreja para ayudar a aliviar el dolor.  Ponga el hielo en una bolsa plstica.  Coloque una toalla entre la piel y la bolsa de hielo.  Coloque el hielo durante 20minutos, 2 a 3veces por da.  No se ponga nada en el odo que no sean los medicamentos que el mdico le recet.  Intente descansar en posicin erguida, en lugar de recostarse. Esto puede ayudar a reducir la presin en el odo medio y aliviar el dolor.  Mastique goma de mascar si esto ayuda a aliviar el dolor de odos.  Controle cualquier alergia que tenga.  Concurra a todas las visitas de control como se lo haya indicado el mdico. Esto es importante. SOLICITE ATENCIN MDICA SI:  El dolor no mejora en el trmino de 2das.  Tiene fiebre.  Tiene sntomas nuevos o estos empeoran. SOLICITE ATENCIN MDICA DE INMEDIATO SI:  Sufre un dolor intenso de cabeza.  Presenta rigidez en el cuello.  Tiene dificultad para tragar.  Hay enrojecimiento o hinchazn detrs de la oreja.  Tiene secrecin del odo.  Tiene prdida de la audicin.  Siente mareos. Esta informacin no tiene como fin reemplazar el consejo del mdico. Asegrese de hacerle al mdico cualquier pregunta que tenga. Document Released: 09/08/2007 Document Revised: 06/22/2014 Elsevier Interactive Patient Education  2017 Elsevier Inc.  

## 2016-04-29 NOTE — Progress Notes (Signed)
  Chief Complaint  Patient presents with  . Ear Pain    LEFT  . Flu Vaccine    HPI   Pt reports that left ear pain She reports that 4 days She has some sore throat, cough, postnasal drip No fevers or chills She reports that last week she had pins and needle sensation in her had that has been getting better.  She reports that right now only her ear hurts.  Past Medical History:  Diagnosis Date  . Depression   . Vitamin D deficiency     Current Outpatient Prescriptions  Medication Sig Dispense Refill  . cholecalciferol (VITAMIN D) 1000 UNITS tablet Take 1,000 Units by mouth daily.    . fluticasone (FLONASE) 50 MCG/ACT nasal spray Place 2 sprays into both nostrils daily. 16 g 6  . Hyoscyamine Sulfate 0.375 MG TBCR Take one tab twice a day for 5 days, then when necessary, abdominal pain (Patient not taking: Reported on 04/29/2016) 25 tablet 1  . sulindac (CLINORIL) 150 MG tablet Take one tab twice a day on a full stomach for 5 days, then as needed (Patient not taking: Reported on 04/29/2016) 20 tablet 1   No current facility-administered medications for this visit.     Allergies: No Known Allergies  Past Surgical History:  Procedure Laterality Date  . CESAREAN SECTION  06/29/06    REPEAT C/S W BTSP  . TUBAL LIGATION  06-29-06   AT TIME OF REPEAT C/S    Social History   Social History  . Marital status: Married    Spouse name: N/A  . Number of children: 3  . Years of education: N/A   Occupational History  . Oncologist    Social History Main Topics  . Smoking status: Never Smoker  . Smokeless tobacco: Never Used  . Alcohol use No  . Drug use: No  . Sexual activity: Yes    Birth control/ protection: Surgical     Comment: TUBAL LIGATION   Other Topics Concern  . None   Social History Narrative  . None    ROS  Objective: Vitals:   04/29/16 1338  BP: 110/68  Pulse: 64  Resp: 18  Temp: 98.3 F (36.8 C)  TempSrc: Oral  SpO2: 100%  Weight: 119 lb  (54 kg)  Height: 5' (1.524 m)    Physical Exam General: alert, oriented, in NAD Head: normocephalic, atraumatic, no sinus tenderness, no scalp tenderness Eyes: EOM intact, no scleral icterus or conjunctival injection Ears: TM clear bilaterally Throat: no pharyngeal exudate or erythema Lymph: no posterior auricular, submental or cervical lymph adenopathy Heart: normal rate, normal sinus rhythm, no murmurs Lungs: clear to auscultation bilaterally, no wheezing   Assessment and Plan Izza was seen today for ear pain and flu vaccine.  Diagnoses and all orders for this visit:  Need for prophylactic vaccination and inoculation against influenza -     Flu Vaccine QUAD 36+ mos IM  Otalgia of left ear- advised pt that since her exam is normal this might be sinus pressure She should try flonase  Sinus congestion Advised pt that Eustachian tube dysfunction Discussed that flonase can improve her symptoms -     fluticasone (FLONASE) 50 MCG/ACT nasal spray; Place 2 sprays into both nostrils daily.     [place]

## 2016-05-25 ENCOUNTER — Ambulatory Visit (INDEPENDENT_AMBULATORY_CARE_PROVIDER_SITE_OTHER): Payer: Commercial Managed Care - PPO | Admitting: Gynecology

## 2016-05-25 ENCOUNTER — Encounter: Payer: Self-pay | Admitting: Gynecology

## 2016-05-25 VITALS — BP 120/70 | Ht 60.5 in | Wt 118.0 lb

## 2016-05-25 DIAGNOSIS — Z01419 Encounter for gynecological examination (general) (routine) without abnormal findings: Secondary | ICD-10-CM

## 2016-05-25 DIAGNOSIS — Z8639 Personal history of other endocrine, nutritional and metabolic disease: Secondary | ICD-10-CM | POA: Diagnosis not present

## 2016-05-25 DIAGNOSIS — N920 Excessive and frequent menstruation with regular cycle: Secondary | ICD-10-CM | POA: Diagnosis not present

## 2016-05-25 LAB — CBC WITH DIFFERENTIAL/PLATELET
Basophils Absolute: 41 cells/uL (ref 0–200)
Basophils Relative: 1 %
EOS ABS: 164 {cells}/uL (ref 15–500)
Eosinophils Relative: 4 %
HEMATOCRIT: 39 % (ref 35.0–45.0)
Hemoglobin: 12.6 g/dL (ref 11.7–15.5)
LYMPHS PCT: 48 %
Lymphs Abs: 1968 cells/uL (ref 850–3900)
MCH: 30 pg (ref 27.0–33.0)
MCHC: 32.3 g/dL (ref 32.0–36.0)
MCV: 92.9 fL (ref 80.0–100.0)
MONO ABS: 287 {cells}/uL (ref 200–950)
MPV: 10.1 fL (ref 7.5–12.5)
Monocytes Relative: 7 %
NEUTROS PCT: 40 %
Neutro Abs: 1640 cells/uL (ref 1500–7800)
Platelets: 260 10*3/uL (ref 140–400)
RBC: 4.2 MIL/uL (ref 3.80–5.10)
RDW: 14.2 % (ref 11.0–15.0)
WBC: 4.1 10*3/uL (ref 3.8–10.8)

## 2016-05-25 LAB — TSH: TSH: 2.98 m[IU]/L

## 2016-05-25 MED ORDER — TRANEXAMIC ACID 650 MG PO TABS: 1300.0000 mg | ORAL_TABLET | Freq: Three times a day (TID) | ORAL | 11 refills | Status: DC

## 2016-05-25 NOTE — Progress Notes (Signed)
Samantha Robertson 08/02/1970 [id number]   History:    45 y.o.  for annual gyn exam with the only complaint is of heavy cycles but regular. No intermenstrual spotting reported. Patient with past history vitamin D deficiency.Patient's flu vaccine is up-to-date. Patient had normal colonoscopy 2009 as a result of abdominal pains she was experiencing at that time and it was negative.  Past medical history,surgical history, family history and social history were all reviewed and documented in the EPIC chart.  Gynecologic History Patient's last menstrual period was 04/24/2016. Contraception: tubal ligation Last Pap: 2015. Results were: normal Last mammogram: 2017. Results were: normal  Obstetric History OB History  Gravida Para Term Preterm AB Living  3 3 2 1   3   SAB TAB Ectopic Multiple Live Births          3    # Outcome Date GA Lbr Len/2nd Weight Sex Delivery Anes PTL Lv  3 Term     F CS-Unspec  N LIV  2 Term     F CS-Unspec  N LIV  1 Preterm     M Vag-Spont  Y LIV       ROS: A ROS was performed and pertinent positives and negatives are included in the history.  GENERAL: No fevers or chills. HEENT: No change in vision, no earache, sore throat or sinus congestion. NECK: No pain or stiffness. CARDIOVASCULAR: No chest pain or pressure. No palpitations. PULMONARY: No shortness of breath, cough or wheeze. GASTROINTESTINAL: No abdominal pain, nausea, vomiting or diarrhea, melena or bright red blood per rectum. GENITOURINARY: No urinary frequency, urgency, hesitancy or dysuria. MUSCULOSKELETAL: No joint or muscle pain, no back pain, no recent trauma. DERMATOLOGIC: No rash, no itching, no lesions. ENDOCRINE: No polyuria, polydipsia, no heat or cold intolerance. No recent change in weight. HEMATOLOGICAL: No anemia or easy bruising or bleeding. NEUROLOGIC: No headache, seizures, numbness, tingling or weakness. PSYCHIATRIC: No depression, no loss of interest in normal activity or change in sleep  pattern.     Exam: chaperone present  BP 120/70   Ht 5' 0.5" (1.537 m)   Wt 118 lb (53.5 kg)   LMP 04/24/2016   BMI 22.67 kg/m   Body mass index is 22.67 kg/m.  General appearance : Well developed well nourished female. No acute distress HEENT: Eyes: no retinal hemorrhage or exudates,  Neck supple, trachea midline, no carotid bruits, no thyroidmegaly Lungs: Clear to auscultation, no rhonchi or wheezes, or rib retractions  Heart: Regular rate and rhythm, no murmurs or gallops Breast:Examined in sitting and supine position were symmetrical in appearance, no palpable masses or tenderness,  no skin retraction, no nipple inversion, no nipple discharge, no skin discoloration, no axillary or supraclavicular lymphadenopathy Abdomen: no palpable masses or tenderness, no rebound or guarding Extremities: no edema or skin discoloration or tenderness  Pelvic:  Bartholin, Urethra, Skene Glands: Within normal limits             Vagina: No gross lesions or discharge  Cervix: No gross lesions or discharge  Uterus  anteverted, normal size, shape and consistency, non-tender and mobile  Adnexa  Without masses or tenderness  Anus and perineum  normal   Rectovaginal  normal sphincter tone without palpated masses or tenderness             Hemoccult not indicated     Assessment/Plan:  45 y.o. female for annual exam with past history vitamin D deficiency. We'll check a vitamin D level today along with  the following screening blood work: Comprehensive metabolic panel, fasting lipid profile, TSH, CBC, and urinalysis. For menorrhagia she's going to be prescribed Lysteda 250 mg tablet. She will take 2 tablets 3 times a day for 5 days to cut down her bleeding. If she has any intermenstrual spotting she'll report to the office for endometrial biopsy. She is encouraged to do her monthly breast exam. We discussed importance of calcium vitamin D and weightbearing exercise for osteoporosis prevention. Pap smear due  next year. Patient's flu vaccine is up-to-date.   Terrance Mass MD, 10:15 AM 05/25/2016

## 2016-05-26 ENCOUNTER — Other Ambulatory Visit: Payer: Self-pay | Admitting: Anesthesiology

## 2016-05-26 DIAGNOSIS — E559 Vitamin D deficiency, unspecified: Secondary | ICD-10-CM

## 2016-05-26 LAB — URINALYSIS W MICROSCOPIC + REFLEX CULTURE
BACTERIA UA: NONE SEEN [HPF]
BILIRUBIN URINE: NEGATIVE
CRYSTALS: NONE SEEN [HPF]
Casts: NONE SEEN [LPF]
Glucose, UA: NEGATIVE
Ketones, ur: NEGATIVE
Leukocytes, UA: NEGATIVE
Nitrite: NEGATIVE
PROTEIN: NEGATIVE
Specific Gravity, Urine: 1.022 (ref 1.001–1.035)
WBC UA: NONE SEEN WBC/HPF (ref <=5)
YEAST: NONE SEEN [HPF]
pH: 5.5 (ref 5.0–8.0)

## 2016-05-26 LAB — COMPREHENSIVE METABOLIC PANEL
ALT: 16 U/L (ref 6–29)
AST: 20 U/L (ref 10–35)
Albumin: 4.3 g/dL (ref 3.6–5.1)
Alkaline Phosphatase: 50 U/L (ref 33–115)
BUN: 16 mg/dL (ref 7–25)
CALCIUM: 9.1 mg/dL (ref 8.6–10.2)
CO2: 26 mmol/L (ref 20–31)
Chloride: 106 mmol/L (ref 98–110)
Creat: 0.66 mg/dL (ref 0.50–1.10)
GLUCOSE: 95 mg/dL (ref 65–99)
POTASSIUM: 4.4 mmol/L (ref 3.5–5.3)
Sodium: 140 mmol/L (ref 135–146)
Total Bilirubin: 0.3 mg/dL (ref 0.2–1.2)
Total Protein: 6.7 g/dL (ref 6.1–8.1)

## 2016-05-26 LAB — LIPID PANEL
CHOL/HDL RATIO: 3.4 ratio (ref <5.0)
Cholesterol: 156 mg/dL (ref <200)
HDL: 46 mg/dL — AB (ref >50)
LDL CALC: 85 mg/dL (ref <100)
Triglycerides: 124 mg/dL (ref <150)
VLDL: 25 mg/dL (ref <30)

## 2016-05-26 LAB — VITAMIN D 25 HYDROXY (VIT D DEFICIENCY, FRACTURES): Vit D, 25-Hydroxy: 26 ng/mL — ABNORMAL LOW (ref 30–100)

## 2016-05-26 MED ORDER — VITAMIN D (ERGOCALCIFEROL) 1.25 MG (50000 UNIT) PO CAPS: 50000.0000 [IU] | ORAL_CAPSULE | ORAL | 0 refills | Status: DC

## 2016-05-27 LAB — URINE CULTURE: ORGANISM ID, BACTERIA: NO GROWTH

## 2016-05-28 ENCOUNTER — Other Ambulatory Visit: Payer: Self-pay | Admitting: Anesthesiology

## 2016-05-28 ENCOUNTER — Other Ambulatory Visit: Payer: Self-pay | Admitting: Gynecology

## 2016-05-28 DIAGNOSIS — R319 Hematuria, unspecified: Secondary | ICD-10-CM

## 2016-05-28 DIAGNOSIS — R3129 Other microscopic hematuria: Secondary | ICD-10-CM

## 2016-08-17 ENCOUNTER — Other Ambulatory Visit: Payer: Self-pay | Admitting: Gynecology

## 2016-08-17 DIAGNOSIS — Z1231 Encounter for screening mammogram for malignant neoplasm of breast: Secondary | ICD-10-CM

## 2016-08-21 ENCOUNTER — Ambulatory Visit (INDEPENDENT_AMBULATORY_CARE_PROVIDER_SITE_OTHER): Payer: Commercial Managed Care - PPO | Admitting: Physician Assistant

## 2016-08-21 VITALS — BP 122/72 | HR 54 | Temp 97.7°F | Resp 17 | Ht 61.5 in | Wt 121.0 lb

## 2016-08-21 DIAGNOSIS — R51 Headache: Secondary | ICD-10-CM

## 2016-08-21 DIAGNOSIS — R519 Headache, unspecified: Secondary | ICD-10-CM

## 2016-08-21 LAB — POCT CBC
Granulocyte percent: 46.5 %G (ref 37–80)
HCT, POC: 38.5 % (ref 37.7–47.9)
HEMOGLOBIN: 13.8 g/dL (ref 12.2–16.2)
LYMPH, POC: 1.9 (ref 0.6–3.4)
MCH, POC: 32.5 pg — AB (ref 27–31.2)
MCHC: 35.9 g/dL — AB (ref 31.8–35.4)
MCV: 90.6 fL (ref 80–97)
MID (cbc): 0.4 (ref 0–0.9)
MPV: 7.9 fL (ref 0–99.8)
POC Granulocyte: 2 (ref 2–6.9)
POC LYMPH PERCENT: 45.1 %L (ref 10–50)
POC MID %: 8.4 % (ref 0–12)
Platelet Count, POC: 292 10*3/uL (ref 142–424)
RBC: 4.26 M/uL (ref 4.04–5.48)
RDW, POC: 13.8 %
WBC: 4.3 10*3/uL — AB (ref 4.6–10.2)

## 2016-08-21 LAB — POCT GLYCOSYLATED HEMOGLOBIN (HGB A1C): HEMOGLOBIN A1C: 6

## 2016-08-21 MED ORDER — PREDNISONE 20 MG PO TABS: ORAL_TABLET | ORAL | 0 refills | Status: AC

## 2016-08-21 MED ORDER — AMOXICILLIN-POT CLAVULANATE 875-125 MG PO TABS: 1.0000 | ORAL_TABLET | Freq: Two times a day (BID) | ORAL | 0 refills | Status: AC

## 2016-08-21 NOTE — Progress Notes (Signed)
[facility] [address], [place] [address] [phone]  Date:  08/21/2016   Name:  Samantha Robertson   DOB:  1971-05-15   MRN:  [id number]  PCP:  Scarlette Calico, MD    History of Present Illness:  Samantha Robertson is a 46 y.o. female patient who presents to [facility] for fatigue and headache. Telephone translator -- really strong headaches, nausea and weak.  No dizziness. She has some auras.  Headache character described as pounding in her ears.   --2 weeks of these symptoms.  --pain is all over, she also feels a lot of noise in her ears.   Little sound clicking and crackling.  She has mild throat pain, and ear pain.  Feels chills, but no fever.   --diet: nl with vegetables, and meats. --16 oz per day. --no tremulousness, --thirsty, no polyuria,  --tubal ligation.  --no sneezing or water eyes.  --advil makes it feel a little better.  Wt Readings from Last 3 Encounters:  08/21/16 121 lb (54.9 kg)  05/25/16 118 lb (53.5 kg)  04/29/16 119 lb (54 kg)   Patient Active Problem List   Diagnosis Date Noted  . Abdominal pain, chronic, epigastric 05/07/2014  . Varicose veins of lower extremities with other complications 47/65/4650  . Vitamin D insufficiency 01/22/2012  . ONYCHOMYCOSIS, TOENAILS 12/06/2009  . DERMATOPHYTOSIS OF FOOT 12/06/2009  . EPIGASTRIC PAIN 03/09/2008  . CHEST PAIN 07/11/2007  . DIABETES MELLITUS, GESTATIONAL, HX OF 07/11/2007    Past Medical History:  Diagnosis Date  . Depression   . Vitamin D deficiency     Past Surgical History:  Procedure Laterality Date  . CESAREAN SECTION  06/29/06    REPEAT C/S W BTSP  . TUBAL LIGATION  06-29-06   AT TIME OF REPEAT C/S    Social History  Substance Use Topics  . Smoking status: Never Smoker  . Smokeless tobacco: Never Used  . Alcohol use No    Family History  Problem Relation Age of Onset  . Other Maternal Aunt     Cancer in blood  . Kidney disease Father   . Kidney disease Sister    . Colon cancer Neg Hx   . Colon polyps Neg Hx   . Diabetes Neg Hx   . Esophageal cancer Neg Hx   . Gallbladder disease Neg Hx   . Heart disease Neg Hx     No Known Allergies  Medication list has been reviewed and updated.  Current Outpatient Prescriptions on File Prior to Visit  Medication Sig Dispense Refill  . cholecalciferol (VITAMIN D) 1000 UNITS tablet Take 1,000 Units by mouth daily.    . tranexamic acid (LYSTEDA) 650 MG TABS tablet Take 2 tablets (1,300 mg total) by mouth 3 (three) times daily. 30 tablet 11  . Vitamin D, Ergocalciferol, (DRISDOL) 50000 units CAPS capsule Take 1 capsule (50,000 Units total) by mouth every 7 (seven) days. 12 capsule 0   No current facility-administered medications on file prior to visit.     ROS ROS otherwise unremarkable unless listed above.  Physical Examination: BP 122/72   Pulse (!) 54   Temp 97.7 F (36.5 C) (Oral)   Resp 17   Ht 5' 1.5" (1.562 m)   Wt 121 lb (54.9 kg)   LMP 07/19/2016 (Approximate)   SpO2 97%   BMI 22.49 kg/m  Ideal Body Weight: Weight in (lb) to have BMI = 25: 134.2  Physical Exam  Constitutional: She is oriented  to person, place, and time. She appears well-developed and well-nourished. No distress.  HENT:  Head: Normocephalic and atraumatic.  Right Ear: Tympanic membrane, external ear and ear canal normal.  Left Ear: Tympanic membrane, external ear and ear canal normal.  Nose: Mucosal edema and rhinorrhea present. Right sinus exhibits no maxillary sinus tenderness and no frontal sinus tenderness. Left sinus exhibits no maxillary sinus tenderness and no frontal sinus tenderness.  Mouth/Throat: No uvula swelling. No oropharyngeal exudate, posterior oropharyngeal edema or posterior oropharyngeal erythema.  Eyes: Conjunctivae and EOM are normal. Pupils are equal, round, and reactive to light.  Cardiovascular: Normal rate and regular rhythm.  Exam reveals no gallop, no distant heart sounds and no friction  rub.   No murmur heard. Pulmonary/Chest: Effort normal. No respiratory distress. She has no decreased breath sounds. She has no wheezes. She has no rhonchi.  Lymphadenopathy:       Head (right side): No submandibular, no tonsillar, no preauricular and no posterior auricular adenopathy present.       Head (left side): No submandibular, no tonsillar, no preauricular and no posterior auricular adenopathy present.  Neurological: She is alert and oriented to person, place, and time. No cranial nerve deficit.  Skin: She is not diaphoretic.  Psychiatric: She has a normal mood and affect. Her behavior is normal.   Results for orders placed or performed in visit on 87/56/43  Basic metabolic panel  Result Value Ref Range   Glucose 95 65 - 99 mg/dL   BUN 13 6 - 24 mg/dL   Creatinine, Ser 0.75 0.57 - 1.00 mg/dL   GFR calc non Af Amer 96 >59 mL/min/1.73   GFR calc Af Amer 111 >59 mL/min/1.73   BUN/Creatinine Ratio 17 9 - 23   Sodium 142 134 - 144 mmol/L   Potassium 4.8 3.5 - 5.2 mmol/L   Chloride 99 96 - 106 mmol/L   CO2 28 18 - 29 mmol/L   Calcium 10.0 8.7 - 10.2 mg/dL  POCT CBC  Result Value Ref Range   WBC 4.3 (A) 4.6 - 10.2 K/uL   Lymph, poc 1.9 0.6 - 3.4   POC LYMPH PERCENT 45.1 10 - 50 %L   MID (cbc) 0.4 0 - 0.9   POC MID % 8.4 0 - 12 %M   POC Granulocyte 2.0 2 - 6.9   Granulocyte percent 46.5 37 - 80 %G   RBC 4.26 4.04 - 5.48 M/uL   Hemoglobin 13.8 12.2 - 16.2 g/dL   HCT, POC 38.5 37.7 - 47.9 %   MCV 90.6 80 - 97 fL   MCH, POC 32.5 (A) 27 - 31.2 pg   MCHC 35.9 (A) 31.8 - 35.4 g/dL   RDW, POC 13.8 %   Platelet Count, POC 292 142 - 424 K/uL   MPV 7.9 0 - 99.8 fL  POCT glycosylated hemoglobin (Hb A1C)  Result Value Ref Range   Hemoglobin A1C 6.0       Assessment and Plan: Samantha Robertson is a 46 y.o. female who is here today for headache and nausea. Possible sinus.  Advised heavy hydration.   rtc if sxs do not improve in 2 weeks Nonintractable headache, unspecified  chronicity pattern, unspecified headache type - Plan: POCT CBC, POCT glycosylated hemoglobin (Hb P2R), Basic metabolic panel  Ivar Drape, PA-C [facility] 3/16/20183:16 PM

## 2016-08-21 NOTE — Patient Instructions (Addendum)
We are going to try to treat for possible sinus.   I would like you to hydrate well with water.   Please take medication as prescribed, and contact me if your symptoms do not improve. You can also take ibuprofen 600mg  every 6 hours as needed for pain.  Take with food, and do not take longer than 2 weeks.   General Headache Without Cause A headache is pain or discomfort felt around the head or neck area. There are many causes and types of headaches. In some cases, the cause may not be found. Follow these instructions at home: Managing pain   Take over-the-counter and prescription medicines only as told by your doctor.  Lie down in a dark, quiet room when you have a headache.  If directed, apply ice to the head and neck area:  Put ice in a plastic bag.  Place a towel between your skin and the bag.  Leave the ice on for 20 minutes, 2-3 times per day.  Use a heating pad or hot shower to apply heat to the head and neck area as told by your doctor.  Keep lights dim if bright lights bother you or make your headaches worse. Eating and drinking   Eat meals on a regular schedule.  Lessen how much alcohol you drink.  Lessen how much caffeine you drink, or stop drinking caffeine. General instructions   Keep all follow-up visits as told by your doctor. This is important.  Keep a journal to find out if certain things bring on headaches. For example, write down:  What you eat and drink.  How much sleep you get.  Any change to your diet or medicines.  Relax by getting a massage or doing other relaxing activities.  Lessen stress.  Sit up straight. Do not tighten (tense) your muscles.  Do not use tobacco products. This includes cigarettes, chewing tobacco, or e-cigarettes. If you need help quitting, ask your doctor.  Exercise regularly as told by your doctor.  Get enough sleep. This often means 7-9 hours of sleep. Contact a doctor if:  Your symptoms are not helped by  medicine.  You have a headache that feels different than the other headaches.  You feel sick to your stomach (nauseous) or you throw up (vomit).  You have a fever. Get help right away if:  Your headache becomes really bad.  You keep throwing up.  You have a stiff neck.  You have trouble seeing.  You have trouble speaking.  You have pain in the eye or ear.  Your muscles are weak or you lose muscle control.  You lose your balance or have trouble walking.  You feel like you will pass out (faint) or you pass out.  You have confusion. This information is not intended to replace advice given to you by your health care provider. Make sure you discuss any questions you have with your health care provider. Document Released: 03/10/2008 Document Revised: 11/07/2015 Document Reviewed: 09/24/2014 Elsevier Interactive Patient Education  2017 [employer].    IF you received an x-ray today, you will receive an invoice from [facility]. Please contact [facility] at [phone] with questions or concerns regarding your invoice.   IF you received labwork today, you will receive an invoice from [facility]. Please contact [facility] at [phone] with questions or concerns regarding your invoice.   Our billing staff will not be able to assist you with questions regarding bills from these companies.  You will be contacted with  the lab results as soon as they are available. The fastest way to get your results is to activate your My Chart account. Instructions are located on the last page of this paperwork. If you have not heard from [place] regarding the results in 2 weeks, please contact this office.

## 2016-08-22 LAB — BASIC METABOLIC PANEL
BUN/Creatinine Ratio: 17 (ref 9–23)
BUN: 13 mg/dL (ref 6–24)
CALCIUM: 10 mg/dL (ref 8.7–10.2)
CHLORIDE: 99 mmol/L (ref 96–106)
CO2: 28 mmol/L (ref 18–29)
Creatinine, Ser: 0.75 mg/dL (ref 0.57–1.00)
GFR calc Af Amer: 111 mL/min/{1.73_m2} (ref >59)
GFR calc non Af Amer: 96 mL/min/{1.73_m2} (ref >59)
GLUCOSE: 95 mg/dL (ref 65–99)
POTASSIUM: 4.8 mmol/L (ref 3.5–5.2)
SODIUM: 142 mmol/L (ref 134–144)

## 2016-09-04 ENCOUNTER — Ambulatory Visit
Admission: RE | Admit: 2016-09-04 | Discharge: 2016-09-04 | Disposition: A | Discharge status: Home / Self Care | Payer: Commercial Managed Care - PPO | Source: Ambulatory Visit | Attending: Gynecology | Admitting: Gynecology

## 2016-09-04 DIAGNOSIS — Z1231 Encounter for screening mammogram for malignant neoplasm of breast: Secondary | ICD-10-CM

## 2016-09-14 ENCOUNTER — Other Ambulatory Visit: Payer: Self-pay | Admitting: Gynecology

## 2016-09-22 ENCOUNTER — Other Ambulatory Visit: Payer: Self-pay | Admitting: *Deleted

## 2016-10-28 ENCOUNTER — Encounter: Payer: Self-pay | Admitting: Gynecology

## 2016-12-28 ENCOUNTER — Encounter: Payer: Self-pay | Admitting: Physician Assistant

## 2016-12-28 ENCOUNTER — Ambulatory Visit (INDEPENDENT_AMBULATORY_CARE_PROVIDER_SITE_OTHER): Payer: Commercial Managed Care - PPO | Admitting: Physician Assistant

## 2016-12-28 VITALS — BP 119/75 | HR 56 | Temp 97.7°F | Resp 18 | Ht 61.5 in | Wt 121.6 lb

## 2016-12-28 DIAGNOSIS — I959 Hypotension, unspecified: Secondary | ICD-10-CM

## 2016-12-28 DIAGNOSIS — R110 Nausea: Secondary | ICD-10-CM | POA: Diagnosis not present

## 2016-12-28 LAB — POCT CBC
Granulocyte percent: 47.4 % (ref 37–80)
HCT, POC: 42.8 % (ref 37.7–47.9)
Hemoglobin: 14.6 g/dL (ref 12.2–16.2)
Lymph, poc: 2.2 (ref 0.6–3.4)
MCH, POC: 31.3 pg — AB (ref 27–31.2)
MCHC: 34.1 g/dL (ref 31.8–35.4)
MCV: 91.9 fL (ref 80–97)
MID (cbc): 0.3 (ref 0–0.9)
MPV: 8.3 fL (ref 0–99.8)
POC Granulocyte: 2.3 (ref 2–6.9)
POC LYMPH PERCENT: 45.5 % (ref 10–50)
POC MID %: 7.1 % (ref 0–12)
Platelet Count, POC: 269 10*3/uL (ref 142–424)
RBC: 4.66 M/uL (ref 4.04–5.48)
RDW, POC: 14 %
WBC: 4.8 10*3/uL (ref 4.6–10.2)

## 2016-12-28 LAB — POCT URINALYSIS DIP (MANUAL ENTRY)
Bilirubin, UA: NEGATIVE
Glucose, UA: NEGATIVE mg/dL
Ketones, POC UA: NEGATIVE mg/dL
Leukocytes, UA: NEGATIVE
Nitrite, UA: NEGATIVE
Protein Ur, POC: NEGATIVE mg/dL
Spec Grav, UA: 1.02 (ref 1.010–1.025)
Urobilinogen, UA: 0.2 E.U./dL
pH, UA: 5.5 (ref 5.0–8.0)

## 2016-12-28 LAB — POC MICROSCOPIC URINALYSIS (UMFC): Mucus: ABSENT

## 2016-12-28 LAB — POCT URINE PREGNANCY: Preg Test, Ur: NEGATIVE

## 2016-12-28 NOTE — Patient Instructions (Signed)
Your lab work looks great!  No concerning findings on your EKG.  Please continue to stay well hydrated  - try drinking 2-3 liters of water daily. Eat several small meals/day.  Come back if you are not better in 1-2 weeks.   Thank you for coming in today. I hope you feel we met your needs.  Feel free to call [facility] if you have any questions or further requests.  Please consider signing up for MyChart if you do not already have it, as this is a great way to communicate with me.  Best,  [employer], PA-C

## 2016-12-28 NOTE — Progress Notes (Signed)
Samantha Robertson  MRN: [id number] DOB: March 16, 1971  PCP: Samantha Lima, MD  Subjective:  Pt is a 47 year old female who presents to clinic for low blood pressure x 1 week. Today's blood pressure 108/61. She has felt like her blood pressure has been going down and she has been feeling sick. Endorses "feeling hot", weak, dizzy and lightheaded recently. Is not worse standing from seated position. Happens when she is standing up. Is present all the time, especially when she is working. She works Tourist information centre manager. Dizziness and lightheadedness is not present while she is sitting.  Endorses nausea x 1 week. She is eating normally and drinking water daily. She drinks 2-3 bottles of water daily. No increase in alcohol. Breakfast is yogurt, lunch and dinner is whatever is at home. She does not eat meals.  Feels like the food is "way up high" - this is not a new problem.Samantha Robertson an increase in the amount of stress in her life recently. She has been feeling sad, crying and weak. She does not feel like working. She feels like she is crying about nothing in particular.  She is having periods. LMP was last month. She is late, however this is normal for her. She is unsure how old her mother was at menopause onset. She endorses increasingly irregular periods.  Notes hair falling out x 1 month. Denies bowel habit changes.  Denies urinary symptoms. Denies chest pain, palpitations, chest tightness.  Denies changes to skin.  No new medications recently.  She is sleeping well, about 7 hours a night.  Denies blood in stool, v/d, abdominal pain, headache.   Review of Systems  Constitutional: Positive for fatigue. Negative for chills and fever.  Respiratory: Negative for cough and wheezing.   Cardiovascular: Negative for chest pain and palpitations.  Endocrine: Negative for cold intolerance and heat intolerance.  Psychiatric/Behavioral: Positive for dysphoric mood.    Patient Active Problem List   Diagnosis Date Noted  . Abdominal pain, chronic, epigastric 05/07/2014  . Varicose veins of lower extremities with other complications 39/08/90  . Vitamin D insufficiency 01/22/2012  . ONYCHOMYCOSIS, TOENAILS 12/06/2009  . DERMATOPHYTOSIS OF FOOT 12/06/2009  . EPIGASTRIC PAIN 03/09/2008  . CHEST PAIN 07/11/2007  . DIABETES MELLITUS, GESTATIONAL, HX OF 07/11/2007    Current Outpatient Prescriptions on File Prior to Visit  Medication Sig Dispense Refill  . cholecalciferol (VITAMIN D) 1000 UNITS tablet Take 1,000 Units by mouth daily.    . Vitamin D, Ergocalciferol, (DRISDOL) 50000 units CAPS capsule Take 1 capsule (50,000 Units total) by mouth every 7 (seven) days. 12 capsule 0  . tranexamic acid (LYSTEDA) 650 MG TABS tablet Take 2 tablets (1,300 mg total) by mouth 3 (three) times daily. (Patient not taking: Reported on 12/28/2016) 30 tablet 11   No current facility-administered medications on file prior to visit.     Not on File   Objective:  BP 108/61 (BP Location: Right Arm, Patient Position: Sitting, Cuff Size: Normal)   Pulse (!) 57   Temp 97.7 F (36.5 C) (Oral)   Resp 18   Ht 5' 1.5" (1.562 m)   Wt 121 lb 9.6 oz (55.2 kg)   LMP 12/08/2016   SpO2 100%   BMI 22.60 kg/m  Orthostatic VS for the past 24 hrs:  BP- Lying Pulse- Lying BP- Sitting Pulse- Sitting  12/28/16 1137 105/67 56 104/64 60    Physical Exam  Constitutional: She is oriented to person, place, and time and well-developed, well-nourished,  and in no distress. No distress.  Eyes: Pupils are equal, round, and reactive to light. Conjunctivae and EOM are normal.  Cardiovascular: Normal rate, regular rhythm and normal heart sounds.   Pulmonary/Chest: Effort normal and breath sounds normal. No respiratory distress.  Abdominal: Soft. There is no tenderness.  Neurological: She is alert and oriented to person, place, and time. GCS score is 15.  Skin: Skin is warm and dry.  Psychiatric: Mood, memory, affect and  judgment normal.  Vitals reviewed.   Results for orders placed or performed in visit on 12/28/16  POCT CBC  Result Value Ref Range   WBC 4.8 4.6 - 10.2 K/uL   Lymph, poc 2.2 0.6 - 3.4   POC LYMPH PERCENT 45.5 10 - 50 %L   MID (cbc) 0.3 0 - 0.9   POC MID % 7.1 0 - 12 %M   POC Granulocyte 2.3 2 - 6.9   Granulocyte percent 47.4 37 - 80 %G   RBC 4.66 4.04 - 5.48 M/uL   Hemoglobin 14.6 12.2 - 16.2 g/dL   HCT, POC 42.8 37.7 - 47.9 %   MCV 91.9 80 - 97 fL   MCH, POC 31.3 (A) 27 - 31.2 pg   MCHC 34.1 31.8 - 35.4 g/dL   RDW, POC 14.0 %   Platelet Count, POC 269 142 - 424 K/uL   MPV 8.3 0 - 99.8 fL  POCT urinalysis dipstick  Result Value Ref Range   Color, UA yellow yellow   Clarity, UA clear clear   Glucose, UA negative negative mg/dL   Bilirubin, UA negative negative   Ketones, POC UA negative negative mg/dL   Spec Grav, UA 1.020 1.010 - 1.025   Blood, UA moderate (A) negative   pH, UA 5.5 5.0 - 8.0   Protein Ur, POC negative negative mg/dL   Urobilinogen, UA 0.2 0.2 or 1.0 E.U./dL   Nitrite, UA Negative Negative   Leukocytes, UA Negative Negative  POCT Microscopic Urinalysis ([facility])  Result Value Ref Range   WBC,UR,HPF,POC None None WBC/hpf   RBC,UR,HPF,POC None None RBC/hpf   Bacteria None None, Too numerous to count   Mucus Absent Absent   Epithelial Cells, UR Per Microscopy Few (A) None, Too numerous to count cells/hpf   EKG - No ST changes. Sinus  Bradycardia  -Short PR syndrome  Assessment and Plan :  This case was discussed with Dr. Mitchel Robertson.  1. Hypotension, unspecified hypotension type 2. Nausea without vomiting - Orthostatic vital signs - POCT CBC - CMP14+EGFR - POCT urinalysis dipstick - POCT Microscopic Urinalysis ([facility]) - TSH - EKG 12-Lead - POCT urine pregnancy  - No concerning findings today. Negative EKG. Suspect possible perimenopausal state. Advised pt eat more often, several small meals daily and stay well hydrated. Labs are pending. Will contact  with results. RTC in 1-2 weeks if no improvement.    Samantha Pod, PA-C  [facility] 12/28/2016 11:02 AM

## 2016-12-29 LAB — TSH: TSH: 3.35 u[IU]/mL (ref 0.450–4.500)

## 2016-12-29 LAB — CMP14+EGFR
ALT: 26 IU/L (ref 0–32)
AST: 23 IU/L (ref 0–40)
Albumin/Globulin Ratio: 1.8 (ref 1.2–2.2)
Albumin: 4.8 g/dL (ref 3.5–5.5)
Alkaline Phosphatase: 70 IU/L (ref 39–117)
BUN/Creatinine Ratio: 25 — ABNORMAL HIGH (ref 9–23)
BUN: 15 mg/dL (ref 6–24)
Bilirubin Total: <0.2 mg/dL (ref 0.0–1.2)
CO2: 19 mmol/L — ABNORMAL LOW (ref 20–29)
Calcium: 9.5 mg/dL (ref 8.7–10.2)
Chloride: 102 mmol/L (ref 96–106)
Creatinine, Ser: 0.61 mg/dL (ref 0.57–1.00)
GFR calc Af Amer: 126 mL/min/{1.73_m2} (ref >59)
GFR calc non Af Amer: 109 mL/min/{1.73_m2} (ref >59)
Globulin, Total: 2.6 g/dL (ref 1.5–4.5)
Glucose: 92 mg/dL (ref 65–99)
Potassium: 4.1 mmol/L (ref 3.5–5.2)
Sodium: 141 mmol/L (ref 134–144)
Total Protein: 7.4 g/dL (ref 6.0–8.5)

## 2016-12-30 NOTE — Progress Notes (Signed)
Please call pt and let her know her liver, kidneys and thyroid levels look great. Thank you!

## 2017-01-01 ENCOUNTER — Ambulatory Visit (INDEPENDENT_AMBULATORY_CARE_PROVIDER_SITE_OTHER): Payer: Commercial Managed Care - PPO | Admitting: Urgent Care

## 2017-01-01 ENCOUNTER — Encounter: Payer: Self-pay | Admitting: Urgent Care

## 2017-01-01 VITALS — BP 113/67 | HR 61 | Temp 98.4°F | Resp 17 | Ht 61.5 in | Wt 121.4 lb

## 2017-01-01 DIAGNOSIS — E559 Vitamin D deficiency, unspecified: Secondary | ICD-10-CM | POA: Diagnosis not present

## 2017-01-01 DIAGNOSIS — R202 Paresthesia of skin: Secondary | ICD-10-CM | POA: Diagnosis not present

## 2017-01-01 DIAGNOSIS — Z9109 Other allergy status, other than to drugs and biological substances: Secondary | ICD-10-CM | POA: Diagnosis not present

## 2017-01-01 DIAGNOSIS — Z0000 Encounter for general adult medical examination without abnormal findings: Secondary | ICD-10-CM | POA: Diagnosis not present

## 2017-01-01 DIAGNOSIS — M79671 Pain in right foot: Secondary | ICD-10-CM | POA: Diagnosis not present

## 2017-01-01 DIAGNOSIS — R0981 Nasal congestion: Secondary | ICD-10-CM

## 2017-01-01 DIAGNOSIS — M79672 Pain in left foot: Secondary | ICD-10-CM

## 2017-01-01 DIAGNOSIS — R200 Anesthesia of skin: Secondary | ICD-10-CM | POA: Diagnosis not present

## 2017-01-01 MED ORDER — CETIRIZINE HCL 10 MG PO TABS: 10.0000 mg | ORAL_TABLET | Freq: Every day | ORAL | 11 refills | Status: DC

## 2017-01-01 MED ORDER — MELOXICAM 7.5 MG PO TABS: 7.5000 mg | ORAL_TABLET | Freq: Every day | ORAL | 1 refills | Status: DC

## 2017-01-01 NOTE — Progress Notes (Signed)
MRN: [id number]  Subjective:   Ms. Samantha Robertson is a 46 y.o. female presenting for annual physical exam. Married, has 3 children that live at home with her. Has good relationships at home, has a good support network. Works night shift, productions with packaging, 40 hours a week, work is strenuous. Denies smoking cigarettes. Denies alcohol use.   Medical care team includes: PCP: Janith Lima, MD Vision: No visual deficits. Dental: Has dental cleanings every 6 months. OB/GYN: Has pap smears with Dr. Toney Rakes, has had normal pap smears and has these done yearly. Mammography done yearly with Dr. Toney Rakes, normal findings. Specialists: None.   Samantha Robertson has a current medication list which includes the following prescription(s): cholecalciferol. She has No Known Allergies. Samantha Robertson  has a past medical history of Depression and Vitamin D deficiency. Also  has a past surgical history that includes Cesarean section (06/29/06 ) and Tubal ligation (06-29-06). Her family history includes Kidney disease in her father and sister; Other in her maternal aunt.  Immunizations: Tdap 03/02/2013  Review of Systems  Constitutional: Negative for chills, diaphoresis, fever, malaise/fatigue and weight loss.  HENT: Negative for congestion, ear discharge, ear pain, hearing loss, nosebleeds, sore throat and tinnitus.   Eyes: Negative for blurred vision, double vision, photophobia, pain, discharge and redness.  Respiratory: Negative for cough, shortness of breath and wheezing.   Cardiovascular: Negative for chest pain, palpitations and leg swelling.  Gastrointestinal: Negative for abdominal pain, blood in stool, constipation, diarrhea, nausea and vomiting.  Genitourinary: Negative for dysuria, flank pain, frequency, hematuria and urgency.  Musculoskeletal: Positive for joint pain (2 week history of daily heel pain, has not tried medications for relief). Negative for back pain and myalgias.  Skin:  Negative for itching and rash.  Neurological: Positive for tingling (occasional tingling of fingers and feet for the past 2 months, resolved with rest and wearing wrist splints). Negative for dizziness, seizures, loss of consciousness, weakness and headaches.  Endo/Heme/Allergies: Negative for polydipsia.  Psychiatric/Behavioral: Negative for depression, hallucinations, memory loss, substance abuse and suicidal ideas. The patient is not nervous/anxious and does not have insomnia.    Objective:   Vitals: BP 113/67 (BP Location: Right Arm, Patient Position: Sitting, Cuff Size: Normal)   Pulse 61   Temp 98.4 F (36.9 C) (Oral)   Resp 17   Ht 5' 1.5" (1.562 m)   Wt 121 lb 6.4 oz (55.1 kg)   LMP 12/08/2016   SpO2 98%   BMI 22.57 kg/m    Visual Acuity Screening   Right eye Left eye Both eyes  Without correction: 20/20 20/25 20/25   With correction:       Physical Exam  Constitutional: She is oriented to person, place, and time. She appears well-developed and well-nourished.  HENT:  TM's intact bilaterally, no effusions or erythema. Nasal turbinates boggy, nasal passages minimally patent. No sinus tenderness. Oropharynx clear, mucous membranes moist, dentition in good repair.  Eyes: Pupils are equal, round, and reactive to light. Conjunctivae and EOM are normal. Right eye exhibits no discharge. Left eye exhibits no discharge. No scleral icterus.  Neck: Normal range of motion. Neck supple. No thyromegaly present.  Cardiovascular: Normal rate, regular rhythm and intact distal pulses.  Exam reveals no gallop and no friction rub.   No murmur heard. Pulmonary/Chest: No respiratory distress. She has no wheezes. She has no rales.  Abdominal: Soft. Bowel sounds are normal. She exhibits no distension and no mass. There is no tenderness.  Musculoskeletal: Normal range of motion. She  exhibits no edema or tenderness.  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  Lymphadenopathy:    She  has no cervical adenopathy.  Neurological: She is alert and oriented to person, place, and time. She has normal reflexes. She displays normal reflexes. Coordination normal.  Skin: Skin is warm and dry. Capillary refill takes less than 2 seconds. No rash noted. No erythema. No pallor.  Psychiatric: She has a normal mood and affect.   Assessment and Plan :   1. Annual physical exam - Patient is medically healthy and stable, labs pending. - Discussed healthy lifestyle, diet, exercise, preventative care, vaccinations, and addressed patient's concerns.  - Lipid panel - Hemoglobin A1c  2. Vitamin D insufficiency - Labs pending, will provide refills as appropriate. - VITAMIN D 25 Hydroxy (Vit-D Deficiency, Fractures)  3. Pain of both heels - Will use conservative management. Use meloxicam, gel inserts for heels, ice water baths after work. Follow up in 2 weeks. - meloxicam (MOBIC) 7.5 MG tablet; Take 1 tablet (7.5 mg total) by mouth daily.  Dispense: 30 tablet; Refill: 1  4. Numbness and tingling in both hands - Labs pending, likely has mild carpal tunnel syndrome. Will have her use wrist splints as needed with meloxicam.  - Hemoglobin A1c - meloxicam (MOBIC) 7.5 MG tablet; Take 1 tablet (7.5 mg total) by mouth daily.  Dispense: 30 tablet; Refill: 1  5. Numbness and tingling of both feet - Likely related to the nature of her work, wearing compression stockings causing nerve irritation. Will follow up in 2 weeks. - Hemoglobin A1c  6. Environmental allergies 7. Nasal congestion - Start Zyrtec daily.  Jaynee Eagles, PA-C [facility] [phone] 01/01/2017  9:38 AM

## 2017-01-01 NOTE — Patient Instructions (Addendum)
Keeping You Healthy  Get These Tests 1. Blood Pressure- Have your blood pressure checked once a year by your health care provider.  Normal blood pressure is 120/80. 2. Weight- Have your body mass index (BMI) calculated to screen for obesity.  BMI is measure of body fat based on height and weight.  You can also calculate your own BMI at GravelBags.it. 3. Cholesterol- Have your cholesterol checked every 5 years starting at age 46 then yearly starting at age 65. 8. Chlamydia, HIV, and other sexually transmitted diseases- Get screened every year until age 50, then within three months of each new sexual provider. 5. Pap Test - Every 1-5 years; discuss with your health care provider. 6. Mammogram- Every 1-2 years starting at age 65--50  Take these medicines  Calcium with Vitamin D-Your body needs 1200 mg of Calcium each day and [phone] IU of Vitamin D daily.  Your body can only absorb 500 mg of Calcium at a time so Calcium must be taken in 2 or 3 divided doses throughout the day.  Multivitamin with folic acid- Once daily if it is possible for you to become pregnant.  Get these Immunizations  Gardasil-Series of three doses; prevents HPV related illness such as genital warts and cervical cancer.  Menactra-Single dose; prevents meningitis.  Tetanus shot- Every 10 years.  Flu shot-Every year.  Take these steps 1. Do not smoke-Your healthcare provider can help you quit.  For tips on how to quit go to www.smokefree.gov or call 1-800 QUITNOW. 2. Be physically active- Exercise 5 days a week for at least 30 minutes.  If you are not already physically active, start slow and gradually work up to 30 minutes of moderate physical activity.  Examples of moderate activity include walking briskly, dancing, swimming, bicycling, etc. 3. Breast Cancer- A self breast exam every month is important for early detection of breast cancer.  For more information and instruction on self breast exams, ask your  healthcare provider or https://www.patel.info/. 4. Eat a healthy diet- Eat a variety of healthy foods such as fruits, vegetables, whole grains, low fat milk, low fat cheeses, yogurt, lean meats, poultry and fish, beans, nuts, tofu, etc.  For more information go to www. Thenutritionsource.org 5. Drink alcohol in moderation- Limit alcohol intake to one drink or less per day. Never drink and drive. 6. Depression- Your emotional health is as important as your physical health.  If you're feeling down or losing interest in things you normally enjoy please talk to your healthcare provider about being screened for depression. 7. Dental visit- Brush and floss your teeth twice daily; visit your dentist twice a year. 8. Eye doctor- Get an eye exam at least every 2 years. 9. Helmet use- Always wear a helmet when riding a bicycle, motorcycle, rollerblading or skateboarding. 65. Safe sex- If you may be exposed to sexually transmitted infections, use a condom. 11. Seat belts- Seat belts can save your live; always wear one. 12. Smoke/Carbon Monoxide detectors- These detectors need to be installed on the appropriate level of your home. Replace batteries at least once a year. 13. Skin cancer- When out in the sun please cover up and use sunscreen 15 SPF or higher. 14. Violence- If anyone is threatening or hurting you, please tell your healthcare provider.     Sndrome del tnel carpiano (Carpal Tunnel Syndrome) El sndrome del tnel carpiano es una afeccin que causa dolor en la mano y en el brazo. El tnel carpiano es un espacio estrecho ubicado en  el lado palmar de la [place]. Los movimientos de la [place] o ciertas enfermedades pueden causar hinchazn del tnel. Esta hinchazn comprime el nervio principal de la mueca (nervio mediano). CAUSAS Esta afeccin puede ser causada por lo siguiente:  Movimientos repetidos de [employer].  Lesiones en la [place].  Artritis.  Un quiste o un tumor  en el tnel carpiano.  Acumulacin de lquido Solicitor. A veces, se desconoce la causa de esta afeccin. FACTORES DE RIESGO Es ms probable que esta afeccin se manifieste en:  Las personas que tienen trabajos en los que deben [employer] mismos movimientos repetidos de las [place], como los carniceros y los cajeros.  Las mujeres.  Las personas que tienen determinadas enfermedades, por ejemplo: ? Diabetes. ? Obesidad. ? Tiroides hipoactiva (hipotiroidismo). ? Insuficiencia renal. SNTOMAS Los sntomas de esta afeccin incluyen lo siguiente:  Sensacin de hormigueo en los dedos de la mano, Production assistant, radio, el ndice y el dedo [place].  Hormigueo o adormecimiento en la mano.  Sensacin de Social research officer, government en todo el brazo, especialmente cuando la [place] y el codo estn flexionados durante [place].  Dolor en la mueca que sube por el brazo hasta el hombro.  Dolor que baja por la mano o los dedos.  Sensacin de Arvin[employer]itor. Tal vez tenga dificultad para tomar y Licensed conveyancer. Los sntomas pueden empeorar durante la noche. DIAGNSTICO Esta afeccin se diagnostica mediante la historia clnica y un examen fsico. Tambin pueden hacerle exmenes, que incluyen los siguientes:  Electromiografa (EMG). Esta prueba mide las seales elctricas que los nervios les envan a los msculos.  Radiografas. TRATAMIENTO El tratamiento de esta afeccin incluye lo siguiente:  Cambios en el estilo de vida. Es importante dejar de Field seismologist o modificar la actividad que caus la afeccin.  Fisioterapia o terapia ocupacional.  Analgsicos y antiinflamatorios. Esto puede incluir medicamentos que se inyectan en la [place].  Una frula para la [place].  [place]. INSTRUCCIONES PARA EL CUIDADO EN EL HOGAR Si tiene una frula:  sela como se lo haya indicado el mdico. Qutesela solamente como se lo haya indicado el mdico.  Afloje la frula si los dedos se le entumecen,  siente hormigueos o se le enfran y se tornan de Optician, dispensing.  Mantenga la frula limpia y seca. Instrucciones generales  [employer]phi de venta libre y los recetados solamente como se lo haya indicado el mdico.  Haga reposar la [place] de toda actividad que le cause dolor. Si la afeccin tiene relacin con Leander Rams, hable con su empleador [employer] pueden Portal, por [place], usar una almohadilla para apoyar la mueca mientras tipea.  Si se lo indican, aplique hielo sobre la zona dolorida: ? Field seismologist hielo en una bolsa plstica. ? Coloque una [employer] piel y la bolsa de hielo. ? Coloque el hielo durante 3minutos, 2 a 3veces por da.  Concurra a todas las visitas de control como se lo haya indicado el mdico. Esto es importante.  Haga los ejercicios como se lo hayan indicado el mdico, el fisioterapeuta o el terapeuta ocupacional. SOLICITE ATENCIN MDICA SI:  Aparecen nuevos sntomas.  El dolor no se alivia con los [employer].  Los sntomas empeoran.  Esta informacin no tiene Marine scientist el consejo del mdico. Asegrese de hacerle al mdico cualquier pregunta que tenga. Document Released: 06/01/2005 Document Revised: 09/23/2015 Document Reviewed: 10/17/2014 Elsevier Interactive Patient Education  2017 [place]  espolones calcneos son protuberancias seas que se forman en la parte inferior del hueso del taln (calcneo). Son frecuentes y no siempre Administrator, Civil Service. Sin embargo, a menudo Conservator, museum/gallery en la banda de tejido resistente que se extiende por debajo del hueso del pie (fascia plantar). Cuando esto sucede, puede sentir Fiserv parte inferior del pie, cerca del taln. CAUSAS No se conoce con exactitud la causa de los espolones calcneos. Pueden deberse a la presin ejercida sobre el taln, o bien originarse en las inserciones musculares (tendones) cercanas que traccionan en el  taln. FACTORES DE RIESGO Puede estar en riesgo de tener un espoln calcneo si:  Tiene ms de 40 aos.  Tiene sobrepeso.  Padece artritis por uso y desgaste (artrosis).  Presenta inflamacin en la fascia plantar. SIGNOS Y SNTOMAS Algunas personas tienen espolones calcneos aunque no presentan sntomas. Si tiene sntomas, estos pueden incluir los siguientes:  Dolor en la parte inferior del taln.  Dolor que empeora al levantarse de la cama por primera vez.  Dolor que empeora despus de caminar o ponerse de pie. DIAGNSTICO El mdico podr hacer el diagnstico del espoln calcneo en funcin de sus sntomas y del examen fsico. Tambin es posible que le realicen una radiografa del pie para detectar la presencia de una protuberancia sea que proviene del calcneo. TRATAMIENTO El tratamiento pretende aliviar el dolor del espoln calcneo. Este puede incluir lo siguiente:  Ejercicios de Estate manager/land agent.  Bajar de [place].  Usar calzado, plantillas o aparatos ortopdicos especficos para mayor comodidad y 68.  Usar frulas a la noche para Animator en una posicin apropiada.  Tomar medicamentos de venta libre para Best boy.  Recibir tratamiento con ondas sonoras de alta intensidad para fragmentar el espoln (terapia extracorprea por ondas de choque).  Recibir inyecciones de corticoides en el taln para reducir la hinchazn y Best boy.  Someterse a Civil engineer, contracting calcneo causa dolor a Barrister's clerk (crnico). [place] los medicamentos solamente como se lo haya indicado el mdico.  Pregunte a su mdico si debe aplicar hielo o compresas fras en las zonas dolorosas del taln o del pie.  Evite las actividades que puedan causarle dolor hasta que se recupere o como se lo haya indicado el mdico.  [place] antes de hacer ejercicio o actividad fsica.  Use zapatos con un buen apoyo que le calcen bien como le haya  indicado su mdico. Es posible que necesite comprar zapatos nuevos. Si [place] zapatos viejos o que no le calzan bien no obtendr el apoyo que necesita.  Baje de peso si su mdico considera que debera [place]. Esto podra aliviar la presin en el pie que puede estar causando el dolor y las [place]. SOLICITE ATENCIN MDICA SI:  El dolor contina o empeora. Esta informacin no tiene Marine scientist el consejo del mdico. Asegrese de hacerle al mdico cualquier pregunta que tenga. Document Released: 03/18/2006 Document Revised: 06/22/2014 Document Reviewed: 08/02/2013 Elsevier Interactive Patient Education  2018 [employer].     IF you received an x-ray today, you will receive an invoice from [facility]. Please contact [facility] at [phone] with questions or concerns regarding your invoice.   IF you received labwork today, you will receive an invoice from [facility]. Please contact [facility] at [phone] with questions or concerns regarding your invoice.   Our billing staff will not be able to assist you with questions regarding bills from these companies.  You will  be contacted with the lab results as soon as they are available. The fastest way to get your results is to activate your My Chart account. Instructions are located on the last page of this paperwork. If you have not heard from [place] regarding the results in 2 weeks, please contact this office.

## 2017-01-02 LAB — LIPID PANEL
CHOL/HDL RATIO: 3.7 ratio (ref 0.0–4.4)
CHOLESTEROL TOTAL: 166 mg/dL (ref 100–199)
HDL: 45 mg/dL (ref >39)
LDL CALC: 92 mg/dL (ref 0–99)
TRIGLYCERIDES: 146 mg/dL (ref 0–149)
VLDL Cholesterol Cal: 29 mg/dL (ref 5–40)

## 2017-01-02 LAB — VITAMIN D 25 HYDROXY (VIT D DEFICIENCY, FRACTURES): Vit D, 25-Hydroxy: 20.3 ng/mL — ABNORMAL LOW (ref 30.0–100.0)

## 2017-01-02 LAB — HEMOGLOBIN A1C
Est. average glucose Bld gHb Est-mCnc: 123 mg/dL
Hgb A1c MFr Bld: 5.9 % — ABNORMAL HIGH (ref 4.8–5.6)

## 2017-01-06 ENCOUNTER — Encounter: Payer: Self-pay | Admitting: Urgent Care

## 2017-01-06 ENCOUNTER — Other Ambulatory Visit: Payer: Self-pay | Admitting: Urgent Care

## 2017-01-06 MED ORDER — CHOLECALCIFEROL 50 MCG (2000 UT) PO CAPS: 1.0000 | ORAL_CAPSULE | Freq: Every day | ORAL | 1 refills | Status: DC

## 2017-01-07 ENCOUNTER — Telehealth: Payer: Self-pay | Admitting: Urgent Care

## 2017-01-07 NOTE — Telephone Encounter (Signed)
I CALLED THE PATIENT TO COME IN FOR A LAB ONLY LAB DRAW FOR A (PTH) TEST NO ANSWER; NOT ABLE TO LEAVE A MESSAGE  Samantha Robertson

## 2017-01-21 ENCOUNTER — Encounter: Payer: Self-pay | Admitting: Urgent Care

## 2017-01-21 ENCOUNTER — Ambulatory Visit (INDEPENDENT_AMBULATORY_CARE_PROVIDER_SITE_OTHER): Payer: Commercial Managed Care - PPO | Admitting: Urgent Care

## 2017-01-21 ENCOUNTER — Ambulatory Visit (INDEPENDENT_AMBULATORY_CARE_PROVIDER_SITE_OTHER): Payer: Commercial Managed Care - PPO

## 2017-01-21 VITALS — BP 105/63 | HR 59 | Temp 97.9°F | Resp 16 | Ht 61.5 in | Wt 121.6 lb

## 2017-01-21 DIAGNOSIS — M79672 Pain in left foot: Secondary | ICD-10-CM | POA: Diagnosis not present

## 2017-01-21 DIAGNOSIS — E559 Vitamin D deficiency, unspecified: Secondary | ICD-10-CM | POA: Diagnosis not present

## 2017-01-21 DIAGNOSIS — M25775 Osteophyte, left foot: Secondary | ICD-10-CM

## 2017-01-21 DIAGNOSIS — M79671 Pain in right foot: Secondary | ICD-10-CM

## 2017-01-21 DIAGNOSIS — M19072 Primary osteoarthritis, left ankle and foot: Secondary | ICD-10-CM

## 2017-01-21 NOTE — Progress Notes (Signed)
  MRN: [id number] DOB: [birth year]  Subjective:   Samantha Robertson is a 46 y.o. female presenting for Foot Pain  Reports ongoing heel pain, L>R. She was last seen for this on 01/01/2017, was started on meloxicam, given heel inserts, advised to use ice water baths after work. Today, she reports that her symptoms remain the same. She has a strenuous job, works on her feet most of her shift. Denies trauma, swelling, redness. Denies smoking cigarettes.  Tai has a current medication list which includes the following prescription(s): cetirizine, cholecalciferol, and meloxicam. Also has No Known Allergies.  Zuha  has a past medical history of Depression and Vitamin D deficiency. Also  has a past surgical history that includes Cesarean section (06/29/06 ) and Tubal ligation (06-29-06).  Objective:   Vitals: BP 105/63   Pulse (!) 59   Temp 97.9 F (36.6 C) (Oral)   Resp 16   Ht 5' 1.5" (1.562 m)   Wt 121 lb 9.6 oz (55.2 kg)   LMP 01/07/2017   SpO2 98%   BMI 22.60 kg/m   Physical Exam  Constitutional: She is oriented to person, place, and time. She appears well-developed and well-nourished.  Cardiovascular: Normal rate.   Pulmonary/Chest: Effort normal.  Musculoskeletal:       Right foot: There is tenderness (mild over heel). There is normal range of motion, no bony tenderness, no swelling, normal capillary refill, no crepitus and no deformity.       Left foot: There is tenderness (over heel, mild over arch with toes in dorsiflexed position). There is normal range of motion, no bony tenderness, no swelling, normal capillary refill, no crepitus and no deformity.  Neurological: She is alert and oriented to person, place, and time.   Dg Foot Complete Left  Result Date: 01/21/2017 CLINICAL DATA:  46 year old female with persistent left foot and heel pain. EXAM: LEFT FOOT - COMPLETE 3+ VIEW COMPARISON:  None. FINDINGS: Bone mineralization is within normal limits. Calcaneus intact  with mild to moderate plantar surface degenerative spurring. Tarsal bone alignment and joint spaces are normal. Metatarsals appear intact and normally aligned. Mild to moderate for age joint space loss, subchondral sclerosis and degenerative spurring at the first MTP joint. Phalanges appear intact and normally aligned. Other distal joint spaces are within normal limits. IMPRESSION: 1.  No acute osseous abnormality identified. 2. 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 3. Mild to moderate for age plantar surface degenerative spurring at the calcaneus. Electronically Signed   By: Genevie Ann M.D.   On: 01/21/2017 08:49    Assessment and Plan :   1. Osteoarthritis of left foot, unspecified osteoarthritis type 2. Heel pain, bilateral 3. Osteophyte of left foot - Continue with conservative measures, will refer to ortho for consult on OA of her foot. I highly recommended patient look for a different type of job to avoid worsening pain and arthritis.   4. Vitamin D deficiency - PTH, Intact and Calcium pending   Jaynee Eagles, PA-C [facility] [place] [phone] 01/21/2017  8:24 AM

## 2017-01-21 NOTE — Patient Instructions (Addendum)
Tome 500mg  de Tylenol cada 6 horas con comida para dolor y inflammacion. Puede tomar Tylenol con meloxicam 7.5-15mg  diaramente.      Artritis (Arthritis) El trmino artritis se [place] comnmente para hacer referencia al dolor de las articulaciones o a la enfermedad articular. Hay ms de 100tipos de artritis. CAUSAS La causa ms frecuente de esta afeccin es el desgaste de una articulacin. Algunas otras causas son las siguientes:  Gota.  Inflamacin de una articulacin.  Una infeccin de Insurance claims handler.  Esguinces y otras lesiones cerca de la articulacin.  Una reaccin farmacolgica o alrgica. En algunos casos, es posible que la causa no se conozca. SNTOMAS El sntoma principal de esta afeccin es el dolor de la articulacin con el movimiento. Otros sntomas pueden ser los siguientes:  Enrojecimiento, hinchazn o rigidez de Insurance claims handler.  Calor que emana de Water engineer.  Cristy Hilts.  Sensacin generalizada de estar enfermo. DIAGNSTICO Esta afeccin se puede diagnosticar mediante un examen fsico y Arvid Right ellos:  Anlisis de [place].  Anlisis de [place].  Estudios de diagnstico por imgenes, como una resonancia magntica (RM), radiografas o una tomografa computarizada (TC). A veces, se extrae lquido de una articulacin para analizarlo. TRATAMIENTO El tratamiento de esta afeccin puede incluir lo siguiente:  El tratamiento de la causa, si se conoce.  Reposo.  Mantener elevada la articulacin.  Aplicar compresas fras o calientes en la articulacin.  Medicamentos para [employer] sntomas y Risk manager.  Inyecciones de un corticoide, como cortisona, en la articulacin para ayudar a Best boy y Risk manager. Segn la causa de la artritis, tal vez haya que hacer cambios en el estilo de vida para reducir la carga sobre la articulacin. Algunos de los cambios incluyen realizar ms actividad fsica y Sports coach de [place], [place]. [place] los medicamentos de venta libre y los recetados solamente como se lo haya indicado el mdico.  No tome aspirina para Theatre stage manager dolor si se sospecha la presencia de [place]. Actividades  Ponga en reposo la articulacin como se lo haya indicado el mdico. El reposo es importante cuando la enfermedad est activa y la articulacin le duele, est hinchada o rgida.  Evite las actividades que intensifiquen Conservation officer, historic buildings. Es importante Paramedic equilibrio entre la actividad y el reposo.  Con frecuencia, realice ejercicios de flexibilidad articular como se lo haya indicado el mdico. Intente realizar ejercicios de bajo impacto, por ejemplo: ? Natacin. ? Gimnasia acutica. ? Andar en bicicleta. ? Caminar. Cuidado de la articulacin  Si la articulacin se le hincha, mantngala elevada como se lo haya indicado el mdico.  Si al despertar por la maana, nota que la articulacin est rgida, intente tomar una ducha con agua tibia.  Si se lo indican, pngase calor en la articulacin. Si es diabtico, no se aplique calor sin la autorizacin del mdico. ? Coloque una toalla entre la articulacin y la compresa caliente o la almohadilla trmica. ? Coloque el calor en la zona durante 20 o 3minutos.  Si se lo indican, pngase hielo en la articulacin: ? Ponga el hielo en una bolsa plstica. ? Coloque una [employer] piel y la bolsa de hielo. ? Coloque el hielo durante 21minutos, 2 a 3veces por da.  Concurra a todas las visitas de control como se lo haya indicado el mdico. Esto es importante. SOLICITE ATENCIN MDICA SI:  El dolor empeora.  Tiene fiebre. SOLICITE ATENCIN MDICA DE INMEDIATO SI:  Siente dolor, u observa hinchazn o enrojecimiento en la articulacin.  Siente dolor en muchas articulaciones y se le hinchan.  Siente un dolor intenso en la espalda.  Tiene mucha debilidad en la pierna.  No puede  controlar los intestinos o la vejiga. Esta informacin no tiene Marine scientist el consejo del mdico. Asegrese de hacerle al mdico cualquier pregunta que tenga. Document Released: 06/01/2005 Document Revised: 09/23/2015 Document Reviewed: 08/27/2014 Elsevier Interactive Patient Education  2018 [employer].    IF you received an x-ray today, you will receive an invoice from [facility] Radiology. Please contact [facility] Radiology at [phone] with questions or concerns regarding your invoice.   IF you received labwork today, you will receive an invoice from [place]. Please contact [facility] at [phone] with questions or concerns regarding your invoice.   Our billing staff will not be able to assist you with questions regarding bills from these companies.  You will be contacted with the lab results as soon as they are available. The fastest way to get your results is to activate your My Chart account. Instructions are located on the last page of this paperwork. If you have not heard from [place] regarding the results in 2 weeks, please contact this office.

## 2017-01-22 LAB — PTH, INTACT AND CALCIUM
Calcium: 9.3 mg/dL (ref 8.7–10.2)
PTH: 30 pg/mL (ref 15–65)

## 2017-01-23 ENCOUNTER — Encounter: Payer: Self-pay | Admitting: Urgent Care

## 2017-02-09 ENCOUNTER — Ambulatory Visit (INDEPENDENT_AMBULATORY_CARE_PROVIDER_SITE_OTHER): Payer: Commercial Managed Care - PPO | Admitting: Orthopaedic Surgery

## 2017-02-18 ENCOUNTER — Encounter (INDEPENDENT_AMBULATORY_CARE_PROVIDER_SITE_OTHER): Payer: Self-pay | Admitting: Orthopaedic Surgery

## 2017-02-18 ENCOUNTER — Ambulatory Visit (INDEPENDENT_AMBULATORY_CARE_PROVIDER_SITE_OTHER): Payer: Commercial Managed Care - PPO | Admitting: Orthopaedic Surgery

## 2017-02-18 DIAGNOSIS — M79672 Pain in left foot: Secondary | ICD-10-CM

## 2017-02-18 DIAGNOSIS — M79671 Pain in right foot: Secondary | ICD-10-CM | POA: Diagnosis not present

## 2017-02-18 MED ORDER — NAPROXEN 500 MG PO TABS: 500.0000 mg | ORAL_TABLET | Freq: Two times a day (BID) | ORAL | 3 refills | Status: DC

## 2017-02-18 NOTE — Progress Notes (Signed)
Office Visit Note   Patient: Samantha Robertson           Date of Birth: June 30, 1970           MRN: [id number] Visit Date: 02/18/2017              Requested by: Jaynee Eagles, PA-C [place], [place] [address] PCP: Janith Lima, MD   Assessment & Plan: Visit Diagnoses:  1. Bilateral foot pain     Plan: Overall impression is bilateral Achilles tendinosis and plantar fasciitis. Prescription for physical therapy and naproxen. Questions encouraged and answered. Follow-up as needed.  Follow-Up Instructions: Return if symptoms worsen or fail to improve.   Orders:  No orders of the defined types were placed in this encounter.  Meds ordered this encounter  Medications  . naproxen (NAPROSYN) 500 MG tablet    Sig: Take 1 tablet (500 mg total) by mouth 2 (two) times daily with a meal.    Dispense:  30 tablet    Refill:  3      Procedures: No procedures performed   Clinical Data: No additional findings.   Subjective: Chief Complaint  Patient presents with  . Left Foot - Pain    Patient comes in today for pain worse on the left. Her pain is localized to the heel. This is worse with activity. Meloxicam distal help. Denies any injuries or numbness and tingling. No radiation of pain.    Review of Systems  Constitutional: Negative.   HENT: Negative.   Eyes: Negative.   Respiratory: Negative.   Cardiovascular: Negative.   Endocrine: Negative.   Musculoskeletal: Negative.   Neurological: Negative.   Hematological: Negative.   Psychiatric/Behavioral: Negative.   All other systems reviewed and are negative.    Objective: Vital Signs: There were no vitals taken for this visit.  Physical Exam  Constitutional: She is oriented to person, place, and time. She appears well-developed and well-nourished.  HENT:  Head: Normocephalic and atraumatic.  Eyes: EOM are normal.  Neck: Neck supple.  Pulmonary/Chest: Effort normal.  Abdominal: Soft.  Neurological: She  is alert and oriented to person, place, and time.  Skin: Skin is warm. Capillary refill takes less than 2 seconds.  Psychiatric: She has a normal mood and affect. Her behavior is normal. Judgment and thought content normal.  Nursing note and vitals reviewed.   Ortho Exam Bilateral foot and heel exam shows no swelling or skin changes. She is tender under the heel and reattachment of Achilles tendon she has good range of motion. Tarsal tunnel is nontender. Lateral side of the ankle is nontender.  Specialty Comments:  No specialty comments available.  Imaging: No results found.   PMFS History: Patient Active Problem List   Diagnosis Date Noted  . Osteoarthritis of left foot 01/21/2017  . Pain of both heels 01/01/2017  . Abdominal pain, chronic, epigastric 05/07/2014  . Varicose veins of lower extremities with other complications 19/14/7829  . Vitamin D insufficiency 01/22/2012  . ONYCHOMYCOSIS, TOENAILS 12/06/2009  . DERMATOPHYTOSIS OF FOOT 12/06/2009  . EPIGASTRIC PAIN 03/09/2008  . CHEST PAIN 07/11/2007  . DIABETES MELLITUS, GESTATIONAL, HX OF 07/11/2007   Past Medical History:  Diagnosis Date  . Depression   . Vitamin D deficiency     Family History  Problem Relation Age of Onset  . Other Maternal Aunt        Cancer in blood  . Kidney disease Father   . Kidney disease Sister   .  Colon cancer Neg Hx   . Colon polyps Neg Hx   . Diabetes Neg Hx   . Esophageal cancer Neg Hx   . Gallbladder disease Neg Hx   . Heart disease Neg Hx     Past Surgical History:  Procedure Laterality Date  . CESAREAN SECTION  06/29/06    REPEAT C/S W BTSP  . TUBAL LIGATION  06-29-06   AT TIME OF REPEAT C/S   Social History   Occupational History  . Oncologist    Social History Main Topics  . Smoking status: Never Smoker  . Smokeless tobacco: Never Used  . Alcohol use No  . Drug use: No  . Sexual activity: Yes    Birth control/ protection: Surgical     Comment: TUBAL LIGATION

## 2017-04-17 ENCOUNTER — Ambulatory Visit (INDEPENDENT_AMBULATORY_CARE_PROVIDER_SITE_OTHER): Payer: Commercial Managed Care - PPO | Admitting: Physician Assistant

## 2017-04-17 DIAGNOSIS — Z23 Encounter for immunization: Secondary | ICD-10-CM

## 2017-05-28 ENCOUNTER — Encounter: Payer: Commercial Managed Care - PPO | Admitting: Obstetrics & Gynecology

## 2017-06-22 ENCOUNTER — Encounter: Payer: Commercial Managed Care - PPO | Admitting: Obstetrics & Gynecology

## 2017-06-25 ENCOUNTER — Other Ambulatory Visit: Payer: Self-pay

## 2017-06-25 ENCOUNTER — Encounter: Payer: Self-pay | Admitting: Family Medicine

## 2017-06-25 ENCOUNTER — Ambulatory Visit (INDEPENDENT_AMBULATORY_CARE_PROVIDER_SITE_OTHER): Payer: Commercial Managed Care - PPO | Admitting: Family Medicine

## 2017-06-25 VITALS — BP 102/62 | HR 64 | Temp 97.9°F | Ht 61.02 in | Wt 121.8 lb

## 2017-06-25 DIAGNOSIS — H6982 Other specified disorders of Eustachian tube, left ear: Secondary | ICD-10-CM

## 2017-06-25 MED ORDER — FLUTICASONE PROPIONATE 50 MCG/ACT NA SUSP: 1.0000 | Freq: Two times a day (BID) | NASAL | 6 refills | Status: DC

## 2017-06-25 NOTE — Patient Instructions (Addendum)
IF you received an x-ray today, you will receive an invoice from [facility] Radiology. Please contact [facility] Radiology at [phone] with questions or concerns regarding your invoice.   IF you received labwork today, you will receive an invoice from [place]. Please contact [facility] at [phone] with questions or concerns regarding your invoice.   Our billing staff will not be able to assist you with questions regarding bills from these companies.  You will be contacted with the lab results as soon as they are available. The fastest way to get your results is to activate your My Chart account. Instructions are located on the last page of this paperwork. If you have not heard from [place] regarding the results in 2 weeks, please contact this office.        IF you received an x-ray today, you will receive an invoice from [facility] Radiology. Please contact [facility] Radiology at [phone] with questions or concerns regarding your invoice.   IF you received labwork today, you will receive an invoice from [place]. Please contact [facility] at [phone] with questions or concerns regarding your invoice.   Our billing staff will not be able to assist you with questions regarding bills from these companies.  You will be contacted with the lab results as soon as they are available. The fastest way to get your results is to activate your My Chart account. Instructions are located on the last page of this paperwork. If you have not heard from [place] regarding the results in 2 weeks, please contact this office.     Disfuncin de la trompa de Eustaquio (Eustachian Tube Dysfunction) La trompa de Eustaquio conecta el odo medio con la parte posterior de la nariz. Regula la presin de Medical laboratory scientific officer odo medio al permitir que el aire circule por el odo y la [place]. Tambin ayuda a Musician lquido del espacio del odo [place]. Cuando la trompa de Eustaquio no funciona bien, se puede producir una  acumulacin de presin de aire, lquido o ambos en el odo medio. La disfuncin de la trompa de Eustaquio puede Print production planner a uno o a [employer]. CAUSAS Esta afeccin ocurre cuando la trompa de [place] se bloquea o no puede abrirse normalmente. Puede ser consecuencia de lo siguiente:  Infecciones en los odos.  Resfriados y otras infecciones de las vas respiratorias superiores.  Alergias.  Irritacin, por ejemplo, por el humo del cigarrillo o los cidos del estmago que vuelven hacia el esfago (reflujo gastroesofgico).  Cambios sbitos en la presin del aire, como cuando baja un avin.  Crecimientos anormales en la nariz o la garganta, como plipos nasales, tumores o tejido engrosado en la parte posterior de la garganta (adenoides). FACTORES DE RIESGO Puede ser ms probable que esta afeccin se desarrolle en las personas que fuman y las personas que tienen sobrepeso. Tambin es ms probable que la disfuncin de la trompa de Arts administrator se produzca en los nios, especialmente los nios que presentan lo siguiente:  Ciertos defectos congnitos en la boca, como fisura del paladar.  [place]as y [place]. SNTOMAS Los sntomas de esta afeccin pueden incluir lo siguiente:  Sensacin de que el odo est tapado.  Dolor de odo.  Ruidos como chasquidos o crujidos en el odo.  Zumbidos en el odo.  Prdida auditiva.  Prdida del equilibrio. Los sntomas pueden empeorar cuando la presin que tiene a su alrededor [place], como cuando viaja a una zona de mayor altura o en avin. DIAGNSTICO Esta afeccin se puede  diagnosticar en funcin de lo siguiente:  Sus sntomas.  Un examen fsico del odo, de la [place] y de Patent examiner.  Pruebas en las que se determine lo siguiente: ? El movimiento de la membrana del tmpano (timpanograma). ? La audicin Lorel [place]). [place] causa y de la gravedad de Engineer, manufacturing systems. Si los sntomas son leves, es  posible que pueda aliviarlos haciendo circular aire ("destapar") dentro de los odos. Si tiene sntomas de [employer], el tratamiento puede incluir lo siguiente:  Licensed conveyancer.  Antihistamnicos.  Aerosoles nasales o gotas para los odos que contengan medicamentos para reducir la hinchazn (corticoides). En algunos casos, puede necesitar un procedimiento para drenar el lquido de la membrana del tmpano (miringotoma). En este procedimiento, se coloca un tubo pequeo en la membrana del tmpano para hacer lo siguiente:  Drenar el lquido.  Restablecer el aire en el espacio del odo medio. [place] los medicamentos de venta libre y los recetados solamente como se lo haya indicado el mdico.  Utilice las tcnicas recomendadas por el mdico para ayudar a Environmental health practitioner los odos. Estas pueden incluir las siguientes: ? Proofreader. ? Bostezos. ? Tragar vigorosamente con frecuencia. ? Cerrar la boca, taparse la nariz y soplar suavemente por la nariz como si tratara de soltar el aire.  No haga ninguna de estas cosas hasta que el mdico lo autorice: ? Viajar a grandes alturas. ? Viajar en avin. ? Trabajar en una cabina o una habitacin presurizada. ? Practicar buceo.  Mantener secos los odos. Squese bien los odos despus de ducharse o darse un bao.  No fume.  Concurra a todas las visitas de control como se lo haya indicado el mdico. Esto es importante. SOLICITE ATENCIN MDICA SI:  Los sntomas no desaparecen despus del tratamiento.  Los sntomas regresan despus del tratamiento.  No puede destaparse los odos.  Tiene los siguientes sntomas: ? Cristy Hilts. ? Dolor en el odo. ? Dolor de cabeza o en el cuello. ? Hay lquido que sale del odo.  La audicin cambia de repente.  Se siente muy mareado.  Pierde el equilibrio. Esta informacin no tiene Marine scientist el consejo del mdico. Asegrese de hacerle al  mdico cualquier pregunta que tenga. Document Released: 01/29/2016 Document Revised: 01/29/2016 Document Reviewed: 06/20/2014 Elsevier Interactive Patient Education  [employer].

## 2017-06-25 NOTE — Progress Notes (Signed)
1/11/20191:53 PM  Samantha Robertson October 01, 1970, 47 y.o. female [id number]  Chief Complaint  Patient presents with  . Ear Pain    IN BOTH EARS, LEFT SIDE FOR 3 WKS. RIGHT SIDE FOR 1 WK. ALSO HAVING VERTIGO. SAYS NO DRAINING    HPI:   Patient is a 47 y.o. female with past medical history significant for seasonal allergies who presents today for 3 weeks of L > R ear pressure, popping, muffled hearing. Feels warm at times. No drainage. H/o recurring ear infections. No fever, chills, sore throat, cough, SOB.   Depression screen [facility] 2/9 06/25/2017 01/21/2017 01/01/2017  Decreased Interest 0 1 1  Down, Depressed, Hopeless 0 1 1  PHQ - 2 Score 0 2 2  Altered sleeping - 1 1  Tired, decreased energy - 3 3  Change in appetite - 3 3  Feeling bad or failure about yourself  - 0 0  Trouble concentrating - 0 0  Moving slowly or fidgety/restless - 0 0  Suicidal thoughts - 0 0  PHQ-9 Score - 9 9  Difficult doing work/chores - Not difficult at all Not difficult at all    No Known Allergies  Prior to Admission medications   Not on File    Past Medical History:  Diagnosis Date  . Depression   . Vitamin D deficiency     Past Surgical History:  Procedure Laterality Date  . CESAREAN SECTION  06/29/06    REPEAT C/S W BTSP  . TUBAL LIGATION  06-29-06   AT TIME OF REPEAT C/S    Social History   Tobacco Use  . Smoking status: Never Smoker  . Smokeless tobacco: Never Used  Substance Use Topics  . Alcohol use: No    Alcohol/week: 0.0 oz    Family History  Problem Relation Age of Onset  . Other Maternal Aunt        Cancer in blood  . Kidney disease Father   . Kidney disease Sister   . Colon cancer Neg Hx   . Colon polyps Neg Hx   . Diabetes Neg Hx   . Esophageal cancer Neg Hx   . Gallbladder disease Neg Hx   . Heart disease Neg Hx     ROS Per hpi  OBJECTIVE:  Blood pressure 102/62, pulse 64, temperature 97.9 F (36.6 C), temperature source Oral, height 5' 1.02" (1.55  m), weight 121 lb 12.8 oz (55.2 kg), SpO2 98 %.  Physical Exam  Constitutional: She is oriented to person, place, and time and well-developed, well-nourished, and in no distress.  HENT:  Head: Normocephalic and atraumatic.  Right Ear: Hearing, tympanic membrane, external ear and ear canal normal.  Left Ear: Hearing, tympanic membrane, external ear and ear canal normal.  Nose: Mucosal edema present.  Mouth/Throat: Oropharynx is clear and moist.  TMJ nontender  Eyes: EOM are normal. Pupils are equal, round, and reactive to light.  Neck: Neck supple.  Cardiovascular: Normal rate, regular rhythm and normal heart sounds. Exam reveals no gallop and no friction rub.  No murmur heard. Pulmonary/Chest: Effort normal and breath sounds normal. She has no wheezes. She has no rales.  Lymphadenopathy:    She has no cervical adenopathy.  Neurological: She is alert and oriented to person, place, and time. Gait normal.  Skin: Skin is warm and dry.      ASSESSMENT and PLAN  1. Eustachian tube dysfunction, left Discussed supportive measures, new meds r/se/b and RTC precautions. Patient educational handout given. - fluticasone (  FLONASE) 50 MCG/ACT nasal spray; Place 1 spray into both nostrils 2 (two) times daily.  No Follow-up on file.    Rutherford Guys, MD [facility] at [place],  [address] Ph.  [phone] Fax [phone]

## 2017-07-23 ENCOUNTER — Other Ambulatory Visit: Payer: Self-pay | Admitting: Obstetrics & Gynecology

## 2017-07-23 DIAGNOSIS — Z1231 Encounter for screening mammogram for malignant neoplasm of breast: Secondary | ICD-10-CM

## 2017-08-05 ENCOUNTER — Encounter: Payer: Self-pay | Admitting: Obstetrics & Gynecology

## 2017-08-05 ENCOUNTER — Ambulatory Visit (INDEPENDENT_AMBULATORY_CARE_PROVIDER_SITE_OTHER): Payer: Commercial Managed Care - PPO | Admitting: Obstetrics & Gynecology

## 2017-08-05 VITALS — BP 126/78 | Temp 98.3°F | Ht 60.0 in | Wt 120.0 lb

## 2017-08-05 DIAGNOSIS — Z01419 Encounter for gynecological examination (general) (routine) without abnormal findings: Secondary | ICD-10-CM

## 2017-08-05 DIAGNOSIS — N898 Other specified noninflammatory disorders of vagina: Secondary | ICD-10-CM

## 2017-08-05 DIAGNOSIS — R87610 Atypical squamous cells of undetermined significance on cytologic smear of cervix (ASC-US): Secondary | ICD-10-CM | POA: Diagnosis not present

## 2017-08-05 DIAGNOSIS — R350 Frequency of micturition: Secondary | ICD-10-CM | POA: Diagnosis not present

## 2017-08-05 DIAGNOSIS — N951 Menopausal and female climacteric states: Secondary | ICD-10-CM | POA: Diagnosis not present

## 2017-08-05 DIAGNOSIS — Z9851 Tubal ligation status: Secondary | ICD-10-CM | POA: Diagnosis not present

## 2017-08-05 LAB — WET PREP FOR TRICH, YEAST, CLUE

## 2017-08-05 MED ORDER — SULFAMETHOXAZOLE-TRIMETHOPRIM 800-160 MG PO TABS: 1.0000 | ORAL_TABLET | Freq: Two times a day (BID) | ORAL | 0 refills | Status: AC

## 2017-08-05 MED ORDER — MEDROXYPROGESTERONE ACETATE 5 MG PO TABS: 5.0000 mg | ORAL_TABLET | Freq: Every day | ORAL | 4 refills | Status: DC

## 2017-08-05 NOTE — Patient Instructions (Addendum)
1. Encounter for routine gynecological examination with Papanicolaou smear of cervix Normal gynecologic exam.  Pap reflex done.  Breast exam normal.  Screening mammogram scheduled in March 2019.  Health labs with family physician.  2. Tubal ligation status  3. Perimenopause Perimenopause with oligomenorrhea, menses every 3 months.  Provera 5 mg/tab, take 1 tablet per mouth daily for 10 days every 3 months if no spontaneous cycles.  Usage discussed, risks and benefits reviewed.  4. Urinary frequency Urine analysis abnormal.  Decision to treat with Bactrim DS 1 tablet per mouth twice a day for 3 days.  Usage reviewed.  Prescription sent to pharmacy.  Pending urine culture.  5. Vaginal itching Wet prep negative. - WET PREP FOR [place], YEAST, CLUE  Other orders - vitamin B-12 (CYANOCOBALAMIN) 100 MCG tablet; Take 100 mcg by mouth daily. - sulfamethoxazole-trimethoprim (BACTRIM DS,SEPTRA DS) 800-160 MG tablet; Take 1 tablet by mouth 2 (two) times daily for 3 days. - medroxyPROGESTERone (PROVERA) 5 MG tablet; Take 1 tablet (5 mg total) by mouth daily for 10 days. Every 3 months if no spontaneous menstrual period.  Shantell, fue un placer encontrarle hoy!  Voy a informarle de sus [employer].  [place] (Health Maintenance, Female) Un estilo de vida saludable y los cuidados preventivos pueden favorecer considerablemente a la salud y Musician. Pregunte a su mdico cul es el cronograma de exmenes peridicos apropiado para usted. Esta es una buena oportunidad para consultarlo sobre cmo prevenir enfermedades y Kappa sano. Adems de los controles, hay muchas otras cosas que puede hacer usted mismo. Los expertos han realizado numerosas investigaciones [employer] cambios en el estilo de vida y las medidas de prevencin que, [place], lo ayudarn a mantenerse sano. Solicite a su mdico ms informacin. EL PESO Y LA DIETA Consuma una dieta  saludable.  Asegrese de [employer] verduras, frutas, productos lcteos de bajo contenido de [place] y Advertising account planner.  No consuma muchos alimentos de alto contenido de grasas slidas, azcares agregados o sal.  Realice actividad fsica con regularidad. Esta es una de las prcticas ms importantes que puede hacer por su salud. ? La Delorise Shiner de los adultos deben hacer ejercicio durante al menos 154mnutos por semana. El ejercicio debe aumentar la frecuencia cardaca y pActorla transpiracin (ejercicio de iSecretary. ? La mayora de los adultos tambin deben hacer ejercicios de elongacin al mToysRusveces a la semana. Agregue esto al su plan de ejercicio de intensidad moderada. Mantenga un peso saludable.  El ndice de masa corporal ([facility] es una medida que puede utilizarse para identificar posibles problemas de pTownsend Proporciona una estimacin de la grasa corporal basndose en el peso y la altura. Su mdico puede ayudarle a dRadiation protection practitionerIBokeeliay a lScientist, forensico mTheatre managerun peso saludable.  Para las mujeres de 20aos o ms: ? Un ITriad Eye Institutemenor de 18,5 se considera bajo peso. ? Un IOhsu Transplant Hospitalentre 18,5 y 24,9 es normal. ? Un IPontiac General Hospitalentre 25 y 29,9 se considera sobrepeso. ? Un IMC de 30 o ms se considera obesidad. Observe los niveles de colesterol y lpidos en la sangre.  Debe comenzar a rEnglish as a second language teacherde lpidos y cResearch officer, trade unionen la sangre a los 20aos y luego repetirlos cada 553aos  Es posible que nAutomotive engineerlos niveles de colesterol con mayor frecuencia si: ? Sus niveles de lpidos y colesterol son altos. ? Es mayor de [id number] ? Presenta un alto riesgo de padecer enfermedades cardacas. [place]asta [place]  Cncer de pulmn  Se recomienda realizar exmenes de deteccin de cncer de pulmn a personas adultas entre 42 y 44 aos que estn en riesgo de Horticulturist, commercial de pulmn por sus antecedentes de consumo de tabaco.  Se recomienda una tomografa computarizada de baja dosis  de los pulmones todos los aos a las personas que: ? Fuman actualmente. ? Hayan dejado el hbito en algn momento en los ltimos 15aos. ? Hayan fumado durante 30aos un paquete diario. Un paquete-ao equivale a fumar un promedio de un paquete de cigarrillos diario durante un ao.  Los exmenes de deteccin anuales deben continuar hasta que hayan pasado 15aos desde que dej de fumar.  Ya no debern realizarse si tiene un problema de salud que le impida recibir tratamiento para Science writer de pulmn. Cncer de mama  Practique la autoconciencia de la mama. Esto significa reconocer la apariencia normal de sus mamas y cmo las siente.  Tambin significa realizar autoexmenes regulares de [employer]. Informe a su mdico sobre cualquier cambio, sin importar cun pequeo sea.  Si tiene entre 20 y 66 aos, un mdico debe realizarle un examen clnico de las mamas como parte del examen regular de [place], cada 1 a 3aos.  Si tiene 40aos o ms, debe Information systems manager clnico de las Microsoft. Tambin considere realizarse una [place] ([place]) todos los [place].  Si tiene antecedentes familiares de cncer de mama, hable con su mdico para someterse a un estudio gentico.  Si tiene alto riesgo de Chief Financial Officer de mama, hable con su mdico para someterse a Public house manager y [employer].  La evaluacin del gen del cncer de mama (BRCA) se recomienda a mujeres que tengan familiares con cnceres relacionados con el BRCA. Los cnceres relacionados con el BRCA incluyen los siguientes: ? [place]. ? Ovario. ? T[place]pas. ? Cnceres de peritoneo.  Los resultados de la evaluacin determinarn la necesidad de asesoramiento gentico y de [place] de BRCA1 y BRCA2. Cncer de cuello del tero El mdico puede recomendarle que se haga pruebas peridicas de deteccin de cncer de los rganos de la pelvis (ovarios, tero y vagina). Estas pruebas incluyen un  examen plvico, que abarca controlar si se produjeron cambios microscpicos en la superficie del cuello del tero (prueba de Papanicolaou). Pueden recomendarle que se haga estas pruebas cada 3aos, a partir de los 21aos.  A las mujeres que tienen entre 30 y 28aos, los mdicos pueden recomendarles que se sometan a exmenes plvicos y pruebas de Papanicolaou cada 28aos, o a la prueba de Papanicolaou y el examen plvico en combinacin con estudios de deteccin del virus del papiloma humano (VPH) cada 5aos. Algunos tipos de VPH aumentan el riesgo de Chief Financial Officer de cuello del tero. La prueba para la deteccin del VPH tambin puede realizarse a mujeres de cualquier edad cuyos resultados de la prueba de Papanicolaou no sean claros.  Es posible que otros mdicos no recomienden exmenes de deteccin a mujeres no embarazadas que se consideran sujetos de bajo riesgo de Chief Financial Officer de pelvis y que no tienen sntomas. Pregntele al mdico si un examen plvico de deteccin es adecuado para usted.  Si ha recibido un tratamiento para Science writer cervical o una enfermedad que podra causar cncer, necesitar realizarse una prueba de Papanicolaou y controles durante al menos 36 aos de concluido el [place]. Si no se ha hecho el Papanicolaou con regularidad, debern volver a evaluarse los factores de riesgo (como tener un [place]  compaero sexual), para determinar si debe realizarse los estudios nuevamente. Algunas mujeres sufren problemas mdicos que aumentan la probabilidad de Museum/gallery curator cncer de cuello del tero. En estos casos, el mdico podr [employer] se realicen controles y pruebas de Papanicolaou con ms frecuencia. Cncer colorrectal  Este tipo de cncer puede detectarse y a menudo prevenirse.  Por lo general, los estudios de rutina se deben Medical laboratory scientific officer a Field seismologist a Proofreader de los 55 aos y [place] 82 aos.  Sin embargo, el mdico podr aconsejarle que lo haga antes, si tiene factores de riesgo para el  cncer de colon.  Tambin puede recomendarle que use un kit de prueba para Hydrologist en la materia fecal.  Es posible que se use una pequea cmara en el extremo de un tubo para examinar directamente el colon (sigmoidoscopia o colonoscopia) a fin de Hydrographic surveyor formas tempranas de cncer colorrectal.  Los exmenes de rutina generalmente comienzan a los 1aos.  El examen directo del colon se debe repetir cada 5 a 10aos hasta los 75aos. Sin embargo, es posible que se realicen exmenes con mayor frecuencia, si se detectan formas tempranas de plipos precancerosos o pequeos bultos. Cncer de piel  Revise la piel de la cabeza a los pies con regularidad.  Informe a su mdico si aparecen nuevos lunares o los que tiene se modifican, especialmente en su forma y color.  Tambin notifique al mdico si tiene un lunar que es ms grande que el tamao de una goma de lpiz.  Siempre use pantalla solar. Aplique pantalla solar de [place] D[place] y repetida a lo largo del Training and development officer.  Protjase usando mangas y [employer], un sombrero de ala ancha y gafas para el sol, siempre que se encuentre en el exterior. ENFERMEDADES CARDACAS, DIABETES E HIPERTENSIN ARTERIAL  La hipertensin arterial causa enfermedades cardacas y [place] el riesgo de ictus. La hipertensin arterial es ms probable en los siguientes casos: ? Las personas que tienen la presin arterial en el extremo del rango normal (100-139/85-89 mm Hg). ? [employer] con sobrepeso u obesidad. ? Scientist, water quality.  Si usted tiene entre 18 y 39 aos, debe medirse la presin arterial cada 3 a 5 aos. Si usted tiene 40 aos o ms, debe medirse la presin arterial [employer]. Debe medirse la presin arterial dos veces: una vez cuando est en un hospital o una clnica y la otra vez cuando est en otro sitio. Registre el promedio de [employer]. Para controlar su presin arterial cuando no est en un hospital o [place],  puede usar lo siguiente: ? Jorje Guild automtica para medir la presin arterial en una farmacia. ? Un monitor para medir la presin arterial en el hogar.  Si tiene entre 83 y 56 aos, consulte a su mdico si debe tomar aspirina para prevenir el ictus.  Realcese exmenes de deteccin de la diabetes con regularidad. Esto incluye la toma de [place] de sangre para controlar el nivel de azcar en la sangre durante el [place]. ? Si tiene un peso normal y un bajo riesgo de padecer diabetes, realcese este anlisis cada tres aos despus de los 45aos. ? Si tiene sobrepeso y un alto riesgo de padecer diabetes, considere someterse a este anlisis antes o con mayor frecuencia. PREVENCIN DE INFECCIONES HepatitisB  Si tiene un riesgo ms alto de Museum/gallery curator hepatitis B, debe someterse a un examen de deteccin de este virus. Se considera que tiene un alto riesgo de contraer hepatitis B si: ? Naci  en un pas donde la hepatitis B es frecuente. Pregntele a su mdico qu pases son considerados de Public affairs consultant. ? Sus padres nacieron en un pas de alto riesgo y usted no recibi una vacuna que lo proteja contra la hepatitis B (vacuna contra la hepatitis B). ? [place]. ? [place] agujas para inyectarse drogas. ? Vive con alguien que tiene hepatitis B. ? Ha tenido sexo con alguien que tiene hepatitis B. ? Recibe tratamiento de hemodilisis. ? Toma ciertos medicamentos para el cncer, trasplante de rganos y afecciones autoinmunitarias. Hepatitis C  Se recomienda un anlisis de [place] para: ? [employer] 1945 y 1965. ? Todas las personas que tengan un riesgo de haber contrado hepatitis C. Enfermedades de transmisin sexual (ETS).  Debe realizarse pruebas de deteccin de enfermedades de transmisin sexual (ETS), incluidas gonorrea y clamidia si: ? Es sexualmente activo y es menor de [id number]. ? Es mayor de 24aos, y Investment banker, operational informa que corre riesgo de tener este tipo de  infecciones. ? La actividad sexual ha cambiado desde que le hicieron la ltima prueba de deteccin y tiene un riesgo mayor de Best boy clamidia o Radio broadcast assistant. Pregntele al mdico si usted tiene riesgo.  Si no tiene el VIH, pero corre riesgo de infectarse por el virus, se recomienda tomar diariamente un medicamento recetado para evitar la infeccin. Esto se conoce como profilaxis previa a la exposicin. Se considera que est en riesgo si: ? Es [place] sexualmente y no [place] preservativos habitualmente o no conoce el estado del VIH de sus Advertising copywriter. ? Se inyecta drogas. ? Es [place] sexualmente con Ardelia Mems pareja que tiene VIH. Consulte a su mdico para saber si tiene un alto riesgo de infectarse por el VIH. Si opta por comenzar la profilaxis previa a la exposicin, primero debe realizarse anlisis de deteccin del VIH. Luego, le harn anlisis cada 7mses mientras est tomando los medicamentos para la profilaxis previa a la exposicin. [facility] Si es premenopusica y puede quedar [place] solicite a su mdico asesoramiento previo a la concepcin.  Si puede quedar embarazada, tome 400 a [id number](mcg) de cido fAnheuser-Busch  Si desea evitar el embarazo, hable con su mdico sobre el control de la natalidad (anticoncepcin). OSTEOPOROSIS Y MENOPAUSIA  La osteoporosis es una enfermedad en la que los huesos pierden los minerales y la fuerza por el avance de la edad. El resultado pueden ser fracturas graves en los hMars El riesgo de osteoporosis puede identificarse con uArdelia Memsprueba de densidad sea.  Si tiene 65aos o ms, o si est en riesgo de sufrir osteoporosis y fracturas, pregunte a su mdico si debe someterse a exmenes.  Consulte a su mdico si debe tomar un suplemento de calcio o de vitamina D para reducir el riesgo de osteoporosis.  La menopausia puede presentar ciertos sntomas fsicos y rGaffer  La terapia de reemplazo hormonal puede reducir algunos de estos sntomas y  rGaffer Consulte a su mdico para saber si la terapia de reemplazo hormonal es conveniente para usted. INSTRUCCIONES PARA EL CUIDADO EN EL HOGAR  Realcese los estudios de rutina de la salud, dentales y de lPublic librarian  [place]  No consuma ningn producto que contenga tabaco, lo que incluye cigarrillos, tabaco de mHigher education careers advisero cPsychologist, sport and exercise  Si est embarazada, no beba alcohol.  Si est amamantando, reduzca el consumo de alcohol y la frecuencia con la que consume.  Si es mujer y no est embarazada  limite el consumo de alcohol a no ms de 1 medida por da. Una medida equivale a 12onzas de cerveza, 5onzas de vino o 1onzas de bebidas alcohlicas de alta graduacin.  No consuma drogas.  No comparta agujas.  Solicite ayuda a su mdico si necesita apoyo o informacin para abandonar las drogas.  Informe a su mdico si a menudo se siente deprimido.  Notifique a su mdico si alguna vez ha sido vctima de abuso o si no se siente seguro en su hogar. Esta informacin no tiene Marine scientist el consejo del mdico. Asegrese de hacerle al mdico cualquier pregunta que tenga. Document Released: 05/21/2011 Document Revised: 06/22/2014 Document Reviewed: 03/05/2015 Elsevier Interactive Patient Education  2018 [place] de Paediatric nurse (Kegel Exercises) Los ejercicios de Kegel ayudan a fortalecer los msculos que sostienen el recto, la vagina, el intestino delgado, la vejiga y [place]. Los ejercicios de Kegel pueden ayudar a lo siguiente:  Mejorar el control de la vejiga y de los intestinos.  Mejorar la respuesta sexual.  Reducir los problemas o las molestias durante el [place]. Los ejercicios de Kegel implican apretar los msculos del suelo plvico, que son los mismos msculos que comprime cuando trata de Scientist, water quality el flujo de la [place]. Los ejercicios se pueden Regulatory affairs officer est sentado, parado o [place], pero lo mejor es variar la  posicin. EJERCICIOS DE KEGEL 1. Apriete bien los msculos del suelo plvico. Debera sentir una elevacin firme en el rea del recto. Si es [place], tambin debera sentir una compresin en el rea de la vagina. [place], las nalgas y las piernas relajadas. 2. Mantenga los msculos apretados durante 10segundos como mximo. 3. Relaje los msculos. Repita este ejercicio 50veces al da o como se lo haya indicado el mdico. Contine haciendo este ejercicio durante al menos 4 a 6semanas y Doctor, general practice tiempo que le haya indicado el mdico. Esta informacin no tiene Marine scientist el consejo del mdico. Asegrese de hacerle al mdico cualquier pregunta que tenga. Document Released: 05/18/2012 Document Revised: 04/21/2015 Document Reviewed: 04/21/2015 Elsevier Interactive Patient Education  [employer].

## 2017-08-05 NOTE — Progress Notes (Signed)
Chase Caller Mcerlean 03/16/1971 [id number]   History:    47 y.o. G3P3L3 Married.  S/P Tubal Ligation  RP:  Established patient presenting for annual gyn exam   HPI: Menses every 3 months, normal flow.  LMP 05/15/2017.  C/O vaginal itching, frequent urination and odor.  No pelvic pain.  Discomfort with IC, bladder descent?  No fever.  BMs wnl.  Breasts wnl.  Exercises regularly.  BMI 23.44.  Past medical history,surgical history, family history and social history were all reviewed and documented in the EPIC chart.  Gynecologic History Patient's last menstrual period was 05/15/2017. Contraception: tubal ligation Last Pap: 03/2014 . Results were: Negative Last mammogram: 08/2016. Results were: Negative Bone Density: Never Colonoscopy: Never  Obstetric History OB History  Gravida Para Term Preterm AB Living  3 3 2 1   3   SAB TAB Ectopic Multiple Live Births          3    # Outcome Date GA Lbr Len/2nd Weight Sex Delivery Anes PTL Lv  3 Term     F CS-Unspec  N LIV  2 Term     F CS-Unspec  N LIV  1 Preterm     M Vag-Spont  Y LIV       ROS: A ROS was performed and pertinent positives and negatives are included in the history.  GENERAL: No fevers or chills. HEENT: No change in vision, no earache, sore throat or sinus congestion. NECK: No pain or stiffness. CARDIOVASCULAR: No chest pain or pressure. No palpitations. PULMONARY: No shortness of breath, cough or wheeze. GASTROINTESTINAL: No abdominal pain, nausea, vomiting or diarrhea, melena or bright red blood per rectum. GENITOURINARY: No urinary frequency, urgency, hesitancy or dysuria. MUSCULOSKELETAL: No joint or muscle pain, no back pain, no recent trauma. DERMATOLOGIC: No rash, no itching, no lesions. ENDOCRINE: No polyuria, polydipsia, no heat or cold intolerance. No recent change in weight. HEMATOLOGICAL: No anemia or easy bruising or bleeding. NEUROLOGIC: No headache, seizures, numbness, tingling or weakness. PSYCHIATRIC: No  depression, no loss of interest in normal activity or change in sleep pattern.     Exam:   BP 126/78   Temp 98.3 F (36.8 C)   Ht 5' (1.524 m)   Wt 120 lb (54.4 kg)   LMP 05/15/2017   BMI 23.44 kg/m   Body mass index is 23.44 kg/m.  General appearance : Well developed well nourished female. No acute distress HEENT: Eyes: no retinal hemorrhage or exudates,  Neck supple, trachea midline, no carotid bruits, no thyroidmegaly Lungs: Clear to auscultation, no rhonchi or wheezes, or rib retractions  Heart: Regular rate and rhythm, no murmurs or gallops Breast:Examined in sitting and supine position were symmetrical in appearance, no palpable masses or tenderness,  no skin retraction, no nipple inversion, no nipple discharge, no skin discoloration, no axillary or supraclavicular lymphadenopathy Abdomen: no palpable masses or tenderness, no rebound or guarding Extremities: no edema or skin discoloration or tenderness  Pelvic: Vulva: Normal             Vagina: No gross lesions or discharge  Cervix: No gross lesions or discharge.  Pap reflex done.  Uterus  AV, normal size, shape and consistency, non-tender and mobile  Adnexa  Without masses or tenderness  Anus: Normal  U/A: Urine clear, white blood cells 6-10, red blood cells 3-10, few bacteria, nitrites negative.  Pending urine culture.   Assessment/Plan:  47 y.o. female for annual exam   1. Encounter for routine gynecological examination  with Papanicolaou smear of cervix Normal gynecologic exam.  Pap reflex done.  Breast exam normal.  Screening mammogram scheduled in March 2019.  Health labs with family physician.  2. Tubal ligation status  3. Perimenopause Perimenopause with oligomenorrhea, menses every 3 months.  Provera 5 mg/tab, take 1 tablet per mouth daily for 10 days every 3 months if no spontaneous cycles.  Usage discussed, risks and benefits reviewed.  4. Urinary frequency Urine analysis abnormal.  Decision to treat  with Bactrim DS 1 tablet per mouth twice a day for 3 days.  Usage reviewed.  Prescription sent to pharmacy.  Pending urine culture.  5. Vaginal itching Wet prep negative. - WET PREP FOR [place], YEAST, CLUE  Other orders - vitamin B-12 (CYANOCOBALAMIN) 100 MCG tablet; Take 100 mcg by mouth daily. - sulfamethoxazole-trimethoprim (BACTRIM DS,SEPTRA DS) 800-160 MG tablet; Take 1 tablet by mouth 2 (two) times daily for 3 days. - medroxyPROGESTERone (PROVERA) 5 MG tablet; Take 1 tablet (5 mg total) by mouth daily for 10 days. Every 3 months if no spontaneous menstrual period.  Counseling on above issues >50% x 10 minutes  Princess Bruins MD, 9:02 AM 08/05/2017

## 2017-08-06 ENCOUNTER — Encounter: Payer: Self-pay | Admitting: Obstetrics & Gynecology

## 2017-08-06 LAB — HM PAP SMEAR

## 2017-08-10 LAB — URINE CULTURE
MICRO NUMBER: 90243322
SPECIMEN QUALITY:: ADEQUATE

## 2017-08-10 LAB — URINALYSIS, COMPLETE W/RFL CULTURE
Bilirubin Urine: NEGATIVE
Glucose, UA: NEGATIVE
Hyaline Cast: NONE SEEN /LPF
Ketones, ur: NEGATIVE
Nitrites, Initial: NEGATIVE
PROTEIN: NEGATIVE
Specific Gravity, Urine: 1.01 (ref 1.001–1.03)
pH: 7 (ref 5.0–8.0)

## 2017-08-10 LAB — CULTURE INDICATED

## 2017-08-12 LAB — PAP IG W/ RFLX HPV ASCU

## 2017-08-12 LAB — HUMAN PAPILLOMAVIRUS, HIGH RISK: HPV DNA High Risk: NOT DETECTED

## 2017-09-06 ENCOUNTER — Ambulatory Visit
Admission: RE | Admit: 2017-09-06 | Discharge: 2017-09-06 | Disposition: A | Discharge status: Home / Self Care | Payer: Commercial Managed Care - PPO | Source: Ambulatory Visit | Attending: Obstetrics & Gynecology | Admitting: Obstetrics & Gynecology

## 2017-09-06 DIAGNOSIS — Z1231 Encounter for screening mammogram for malignant neoplasm of breast: Secondary | ICD-10-CM

## 2018-01-06 ENCOUNTER — Encounter: Payer: Self-pay | Admitting: Urgent Care

## 2018-01-06 ENCOUNTER — Ambulatory Visit (INDEPENDENT_AMBULATORY_CARE_PROVIDER_SITE_OTHER): Payer: Commercial Managed Care - PPO | Admitting: Urgent Care

## 2018-01-06 ENCOUNTER — Other Ambulatory Visit: Payer: Self-pay

## 2018-01-06 VITALS — BP 107/65 | HR 76 | Temp 99.4°F | Resp 16 | Ht 60.0 in | Wt 120.2 lb

## 2018-01-06 DIAGNOSIS — R300 Dysuria: Secondary | ICD-10-CM

## 2018-01-06 DIAGNOSIS — K529 Noninfective gastroenteritis and colitis, unspecified: Secondary | ICD-10-CM | POA: Diagnosis not present

## 2018-01-06 DIAGNOSIS — R140 Abdominal distension (gaseous): Secondary | ICD-10-CM | POA: Diagnosis not present

## 2018-01-06 DIAGNOSIS — Z9109 Other allergy status, other than to drugs and biological substances: Secondary | ICD-10-CM

## 2018-01-06 DIAGNOSIS — R519 Headache, unspecified: Secondary | ICD-10-CM

## 2018-01-06 DIAGNOSIS — R350 Frequency of micturition: Secondary | ICD-10-CM

## 2018-01-06 DIAGNOSIS — H9313 Tinnitus, bilateral: Secondary | ICD-10-CM

## 2018-01-06 DIAGNOSIS — R197 Diarrhea, unspecified: Secondary | ICD-10-CM

## 2018-01-06 DIAGNOSIS — R51 Headache: Secondary | ICD-10-CM

## 2018-01-06 LAB — POCT URINALYSIS DIP (MANUAL ENTRY)
BILIRUBIN UA: NEGATIVE
BILIRUBIN UA: NEGATIVE mg/dL
Glucose, UA: NEGATIVE mg/dL
LEUKOCYTES UA: NEGATIVE
Nitrite, UA: NEGATIVE
PH UA: 5.5 (ref 5.0–8.0)
Protein Ur, POC: NEGATIVE mg/dL
SPEC GRAV UA: 1.015 (ref 1.010–1.025)
Urobilinogen, UA: 0.2 E.U./dL

## 2018-01-06 LAB — POCT CBC
GRANULOCYTE PERCENT: 85.2 % — AB (ref 37–80)
HCT, POC: 45.1 % (ref 37.7–47.9)
Hemoglobin: 14.4 g/dL (ref 12.2–16.2)
Lymph, poc: 0.7 (ref 0.6–3.4)
MCH: 30.4 pg (ref 27–31.2)
MCHC: 32 g/dL (ref 31.8–35.4)
MCV: 94.8 fL (ref 80–97)
MID (cbc): 0.1 (ref 0–0.9)
MPV: 7.4 fL (ref 0–99.8)
PLATELET COUNT, POC: 222 10*3/uL (ref 142–424)
POC Granulocyte: 4.3 (ref 2–6.9)
POC LYMPH %: 13 % (ref 10–50)
POC MID %: 1.8 %M (ref 0–12)
RBC: 4.75 M/uL (ref 4.04–5.48)
RDW, POC: 13.3 %
WBC: 5.1 10*3/uL (ref 4.6–10.2)

## 2018-01-06 MED ORDER — LOPERAMIDE HCL 2 MG PO TABS: 2.0000 mg | ORAL_TABLET | Freq: Every day | ORAL | 0 refills | Status: DC | PRN

## 2018-01-06 MED ORDER — CETIRIZINE HCL 10 MG PO TABS
10.0000 mg | ORAL_TABLET | Freq: Every day | ORAL | 11 refills | Status: DC
Start: 2018-01-06 — End: 2018-05-19

## 2018-01-06 MED ORDER — PSEUDOEPHEDRINE HCL ER 120 MG PO TB12: 120.0000 mg | ORAL_TABLET | Freq: Two times a day (BID) | ORAL | 3 refills | Status: DC

## 2018-01-06 NOTE — Patient Instructions (Addendum)
Gastroenteritis viral en los adultos Viral Gastroenteritis, Adult La gastroenteritis viral tambin se conoce como gripe estomacal. La causa de esta afeccin son diversos virus. Estos virus puede transmitirse de Ardelia Mems persona a otra con mucha facilidad (son sumamente contagiosos). Esta afeccin podra afectar el estmago, el intestino delgado y el intestino grueso. Puede causar Shella Spearing, fiebre y vmitos repentinos. La diarrea y los vmitos pueden hacerlo sentir dbil y causar deshidratacin. Es posible que no pueda [facility]. La deshidratacin puede hacerlo sentir cansado y sediento, producirle sequedad en la boca y disminuir la frecuencia con la que [place]. [employer] y las personas que tienen otras enfermedades o un sistema inmunitario dbil estn en mayor riesgo de deshidratacin. Es importante restituir los lquidos que pierde por causa de la diarrea y los vmitos. Si se deshidrata gravemente, podra tener que recibir lquidos a travs de una va intravenosa(IV). Cules son las causas? La gastroenteritis es causada por diversos virus, entre los que se incluyen el rotavirus y el norovirus. El norovirus es la causa ms frecuente en los adultos. Puede contraerlo a travs de la ingesta de alimentos o agua contaminados, o por tocar superficies contaminadas con alguno de estos virus. Tambin puede enfermarse al compartir utensilios u otros artculos personales con una persona infectada. Qu incrementa el riesgo? Es ms probable que Orthoptist en las personas que:  Tiene un sistema de defensa (sistema inmunitario) dbil.  Viven con uno o ms nios menores de 2aos.  Viven en hogares de ancianos.  Viajan en cruceros.  Cules son los signos o los sntomas? Los sntomas de esta afeccin suelen [employer] 1 y 2das despus de la exposicin al virus. Pueden durar [employer] o incluso [place]. Los sntomas ms frecuentes son [place] lquida y vmitos.  Otros sntomas pueden ser los siguientes:  Cristy Hilts.  Dolor de [place].  Fatiga.  Dolor en el abdomen.  Escalofros.  Debilidad.  Nuseas.  Dolores musculares.  Prdida del apetito.  Cmo se diagnostica? Esta afeccin se diagnostica mediante sus antecedentes mdicos y un examen fsico. Tambin podran hacerle un anlisis de materia fecal para detectar virus u otras infecciones. Cmo se trata? Por lo general, esta afeccin desaparece por s sola. El tratamiento se centra en la restitucin de los lquidos perdidos (rehidratacin). El mdico podra recomendarle que tome una solucin de rehidratacin oral (SRO) para Run[place], broadcasting/film/video y minerales (electrolitos) importantes en el cuerpo. En los casos ms graves, podra ser necesario administrar lquidos a travs de una va intravenosa (VI). El tratamiento tambin podra incluir medicamentos para [employer] sntomas. Siga estas instrucciones en su casa: Siga las indicaciones del mdico sobre cmo debe cuidarse en su casa. Comida y bebida Siga estas recomendaciones como se lo haya indicado el mdico:  Tome una SRO. Esta es una bebida que se vende en farmacias y tiendas.  Beba lquidos claros en pequeas cantidades segn le sea posible. Beba lquidos claros, como agua, cubitos de hielo, jugos de fruta rebajados con agua y bebidas deportivas bajas en caloras.  En la medida en que pueda, consuma alimentos blandos y fciles de digerir en pequeas cantidades. Estos alimentos incluyen bananas, compota de [place], arroz, carnes [place], tostadas y [place].  Evite consumir lquidos que contengan mucha azcar o cafena, como bebidas energticas, bebidas deportivas y refrescos.  Evite el alcohol.  Evite los alimentos picantes o grasos.  Instrucciones generales   Beba suficiente lquido para mantener la orina clara o de color amarillo plido.  [place]  con frecuencia. Use desinfectante para manos si no dispone de [place] y  [place].  Asegrese de que todas las personas que viven en su casa se laven bien las manos y con frecuencia.  Tome los medicamentos de venta libre y los recetados solamente como se lo haya indicado el mdico.  Descanse en su casa mientras se recupera.  Controle su afeccin para ver si hay cambios.  Tome un bao caliente para ayudar a Transport planner ardor o el dolor causados por los episodios frecuentes de diarrea.  Concurra a todas las visitas de control como se lo haya indicado el mdico. Esto es importante. Comunquese con un mdico si:  No puede retener los lquidos.  Los sntomas empeoran.  Aparecen nuevos sntomas.  Se siente mareado o siente que va a desvanecerse.  Tiene calambres musculares. Solicite ayuda de inmediato si:  Electronics engineer.  Se siente muy dbil o se desmaya.  Observa sangre en el vmito.  El vmito se asemeja al poso del caf.  Tiene heces con sangre, de color negro, o con aspecto alquitranado.  Siente un dolor de cabeza intenso, rigidez en el cuello, o ambas cosas.  Tiene una erupcin cutnea.  Tiene dolor intenso, clicos, o meteorismo en el abdomen.  Le cuesta respirar o respira muy rpidamente.  Su corazn late muy rpidamente.  Siente la piel fra y hmeda.  Se siente confundido.  Siente dolor al [employer].  Tiene signos de deshidratacin, por ejemplo: ? Elmon Else, muy escasa o falta de [place]. ? Labios agrietados. ? [employer]. ? Ojos hundidos. ? Somnolencia. ? Debilidad. Esta informacin no tiene Marine scientist el consejo del mdico. Asegrese de hacerle al mdico cualquier pregunta que tenga. Document Released: 06/01/2005 Document Revised: 09/09/2016 Document Reviewed: 02/05/2015 Elsevier Interactive Patient Education  2018 [employer].     IF you received an x-ray today, you will receive an invoice from [facility]. Please contact [facility] at [phone] with questions or concerns  regarding your invoice.   IF you received labwork today, you will receive an invoice from [place]. Please contact [facility] at [phone] with questions or concerns regarding your invoice.   Our billing staff will not be able to assist you with questions regarding bills from these companies.  You will be contacted with the lab results as soon as they are available. The fastest way to get your results is to activate your My Chart account. Instructions are located on the last page of this paperwork. If you have not heard from [place] regarding the results in 2 weeks, please contact this office.

## 2018-01-06 NOTE — Progress Notes (Signed)
MRN: [id number] DOB: 1971-03-28  Subjective:   Samantha Robertson is a 47 y.o. female presenting for 2 day history of diarrhea (has bowel movements almost hourly), abdominal bloating, warmth/inflamed within her belly. Has had headache, tinnitus. Has tried Advil, Pepto. Has difficulty eating due to her BM but has been trying to stay hydrated. Denies fever, n/v, bloody stools, hematuria. She reports that she has never smoked. She has never used smokeless tobacco. She reports that she does not drink alcohol or use drugs.   Samantha Robertson has a current medication list which includes the following prescription(s): medroxyprogesterone and vitamin b-12. Also has No Known Allergies.  Samantha Robertson  has a past medical history of Depression and Vitamin D deficiency. Also  has a past surgical history that includes Cesarean section (06/29/06 ) and Tubal ligation (06-29-06).  Objective:   Vitals: BP 107/65   Pulse 76   Temp 99.4 F (37.4 C)   Resp 16   Ht 5' (1.524 m)   Wt 120 lb 3.2 oz (54.5 kg)   SpO2 97%   BMI 23.47 kg/m   Physical Exam  Constitutional: She is oriented to person, place, and time. She appears well-developed and well-nourished.  HENT:  Mouth/Throat: Oropharynx is clear and moist.  Mucosal edema and mild rhinorrhea. TMs opaque bilaterally but without erythema, perforation.  Eyes: Pupils are equal, round, and reactive to light. EOM are normal. Right eye exhibits no discharge. Left eye exhibits no discharge. No scleral icterus.  Cardiovascular: Normal rate, regular rhythm, normal heart sounds and intact distal pulses. Exam reveals no gallop and no friction rub.  No murmur heard. Pulmonary/Chest: Effort normal and breath sounds normal. No stridor. No respiratory distress. She has no wheezes. She has no rales.  Abdominal: Soft. Bowel sounds are normal. She exhibits no distension and no mass. There is no tenderness. There is no rebound and no guarding.  Musculoskeletal: She exhibits no  edema.  Neurological: She is alert and oriented to person, place, and time. She displays normal reflexes. No cranial nerve deficit. Coordination normal.  Skin: Skin is warm and dry. No rash noted. No erythema. No pallor.  Psychiatric: She has a normal mood and affect.    Results for orders placed or performed in visit on 01/06/18 (from the past 24 hour(s))  POCT CBC     Status: Abnormal   Collection Time: 01/06/18  5:38 PM  Result Value Ref Range   WBC 5.1 4.6 - 10.2 K/uL   Lymph, poc 0.7 0.6 - 3.4   POC LYMPH PERCENT 13.0 10 - 50 %L   MID (cbc) 0.1 0 - 0.9   POC MID % 1.8 0 - 12 %M   POC Granulocyte 4.3 2 - 6.9   Granulocyte percent 85.2 (A) 37 - 80 %G   RBC 4.75 4.04 - 5.48 M/uL   Hemoglobin 14.4 12.2 - 16.2 g/dL   HCT, POC 45.1 37.7 - 47.9 %   MCV 94.8 80 - 97 fL   MCH, POC 30.4 27 - 31.2 pg   MCHC 32.0 31.8 - 35.4 g/dL   RDW, POC 13.3 %   Platelet Count, POC 222 142 - 424 K/uL   MPV 7.4 0 - 99.8 fL  POCT urinalysis dipstick     Status: Abnormal   Collection Time: 01/06/18  5:40 PM  Result Value Ref Range   Color, UA yellow yellow   Clarity, UA clear clear   Glucose, UA negative negative mg/dL   Bilirubin, UA negative negative  Ketones, POC UA negative negative mg/dL   Spec Grav, UA 1.015 1.010 - 1.025   Blood, UA large (A) negative   pH, UA 5.5 5.0 - 8.0   Protein Ur, POC negative negative mg/dL   Urobilinogen, UA 0.2 0.2 or 1.0 E.U./dL   Nitrite, UA Negative Negative   Leukocytes, UA Negative Negative    Assessment and Plan :   Gastroenteritis  Urinary frequency - Plan: POCT urinalysis dipstick, Urine Culture  Abdominal bloating - Plan: POCT CBC  Diarrhea, unspecified type - Plan: POCT CBC  Tinnitus of both ears  Environmental allergies  Dysuria - Plan: POCT urinalysis dipstick, POCT CBC, Urine Culture  Acute nonintractable headache, unspecified headache type  We will manage for gastroenteritis with loperamide and aggressive hydration.  I suspect  that her symptoms are largely related to dehydration.  However, also will have patient start medication to address allergies given tinnitus of both of her years and physical exam findings.  Labs pending.  Follow-up precautions reviewed.  Jaynee Eagles, PA-C [facility] at [facility] [phone] 01/06/2018  4:56 PM

## 2018-01-07 ENCOUNTER — Encounter: Payer: Self-pay | Admitting: Urgent Care

## 2018-03-20 IMAGING — DX DG FOOT COMPLETE 3+V*L*
3 series · 3 of 3 positions shown · non-contrast
Comparison: None.

CLINICAL DATA: 46-year-old female with persistent left foot and
heel pain.

EXAM:
LEFT FOOT - COMPLETE 3+ VIEW

[foot ap]
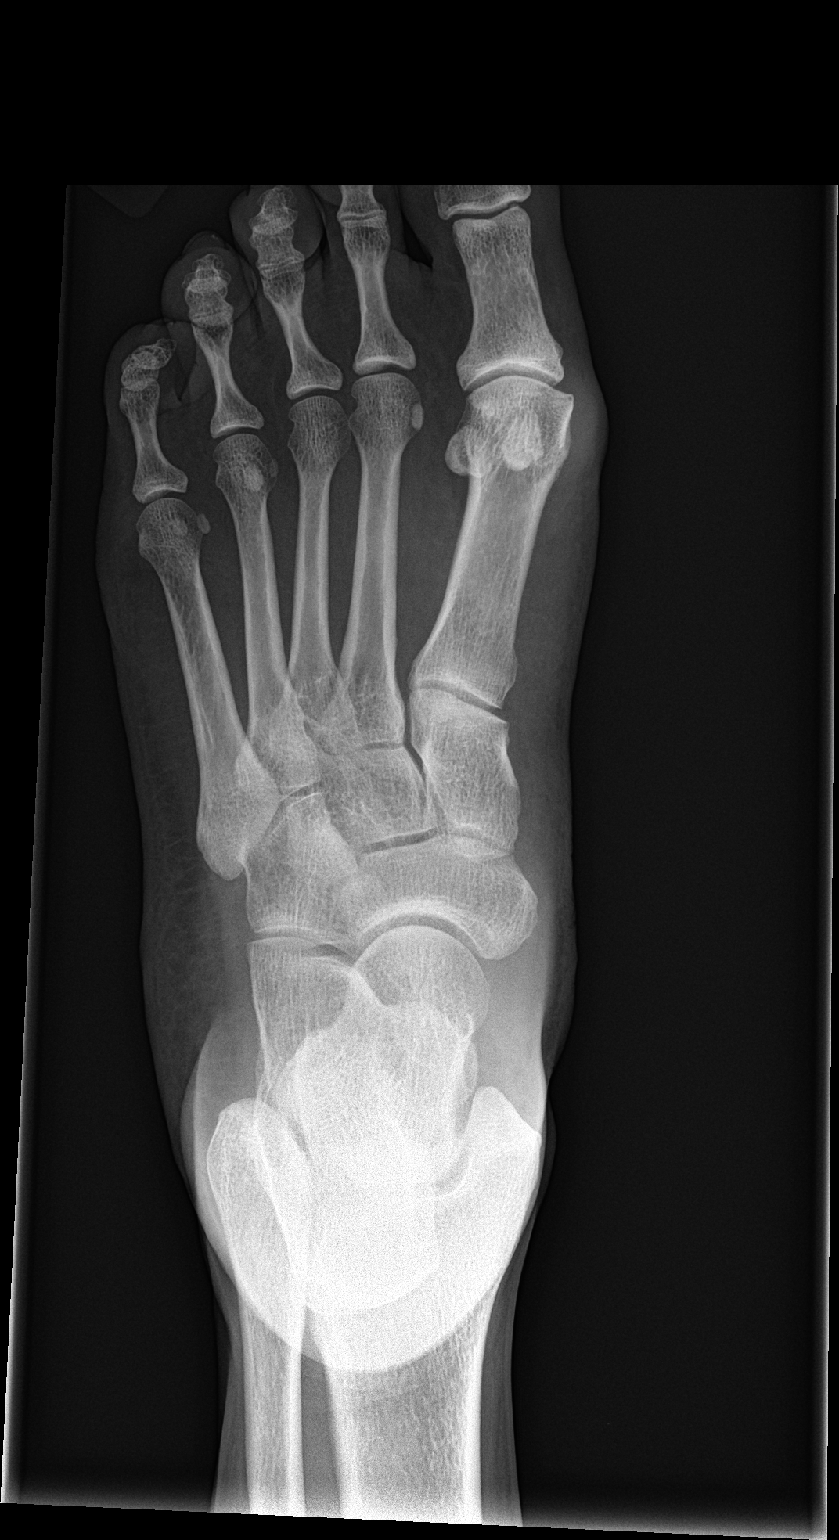

[foot obl]
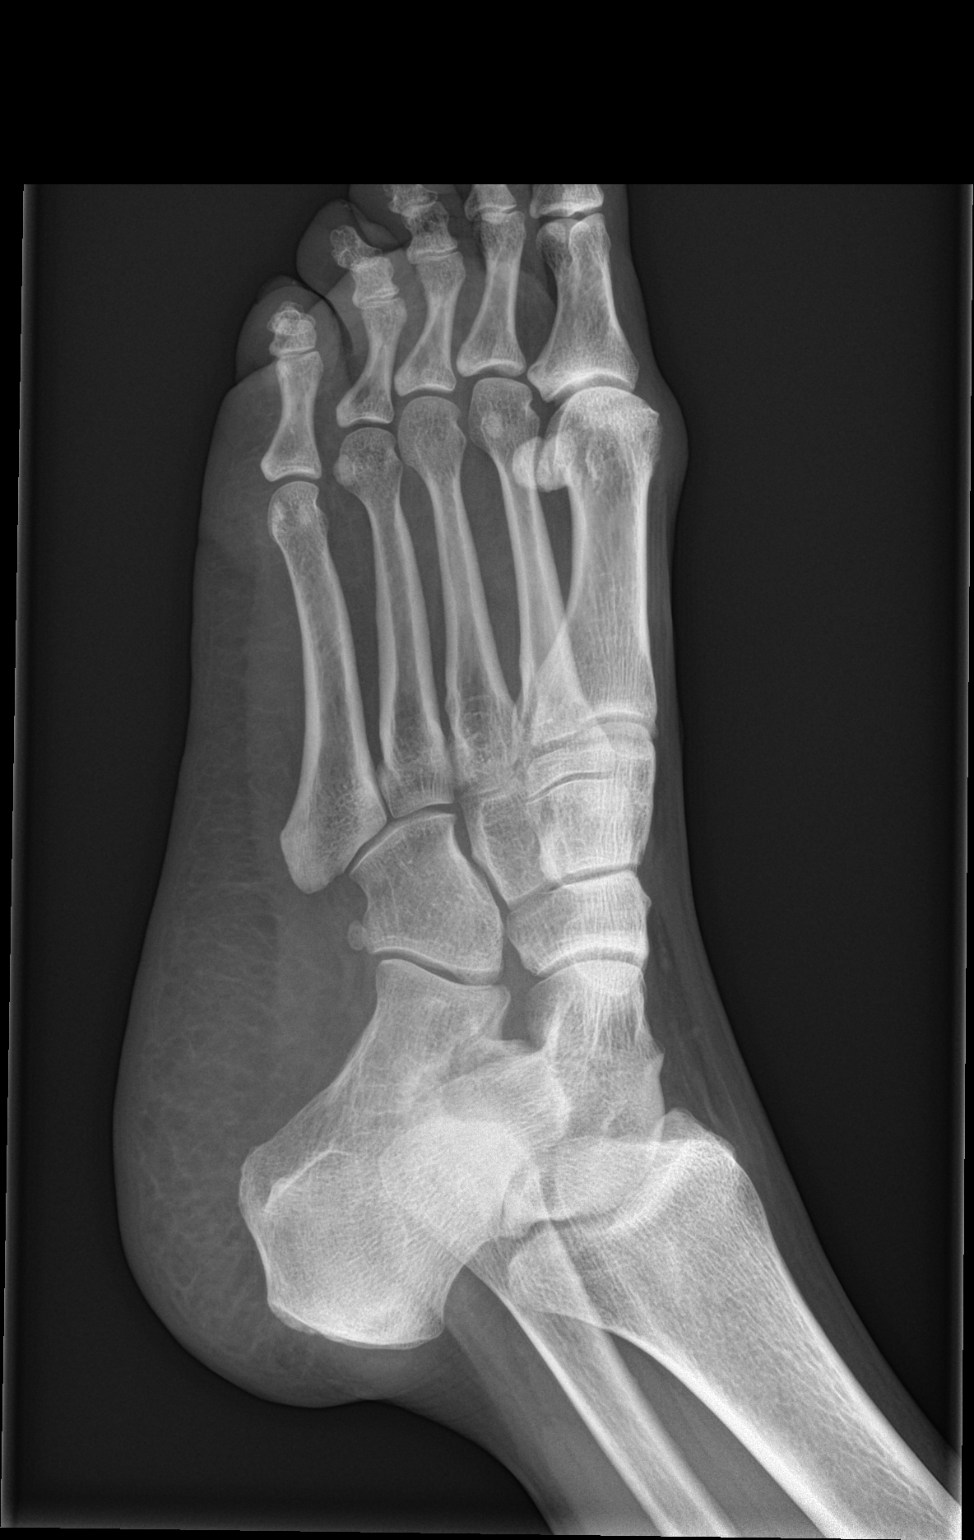

[foot lat]
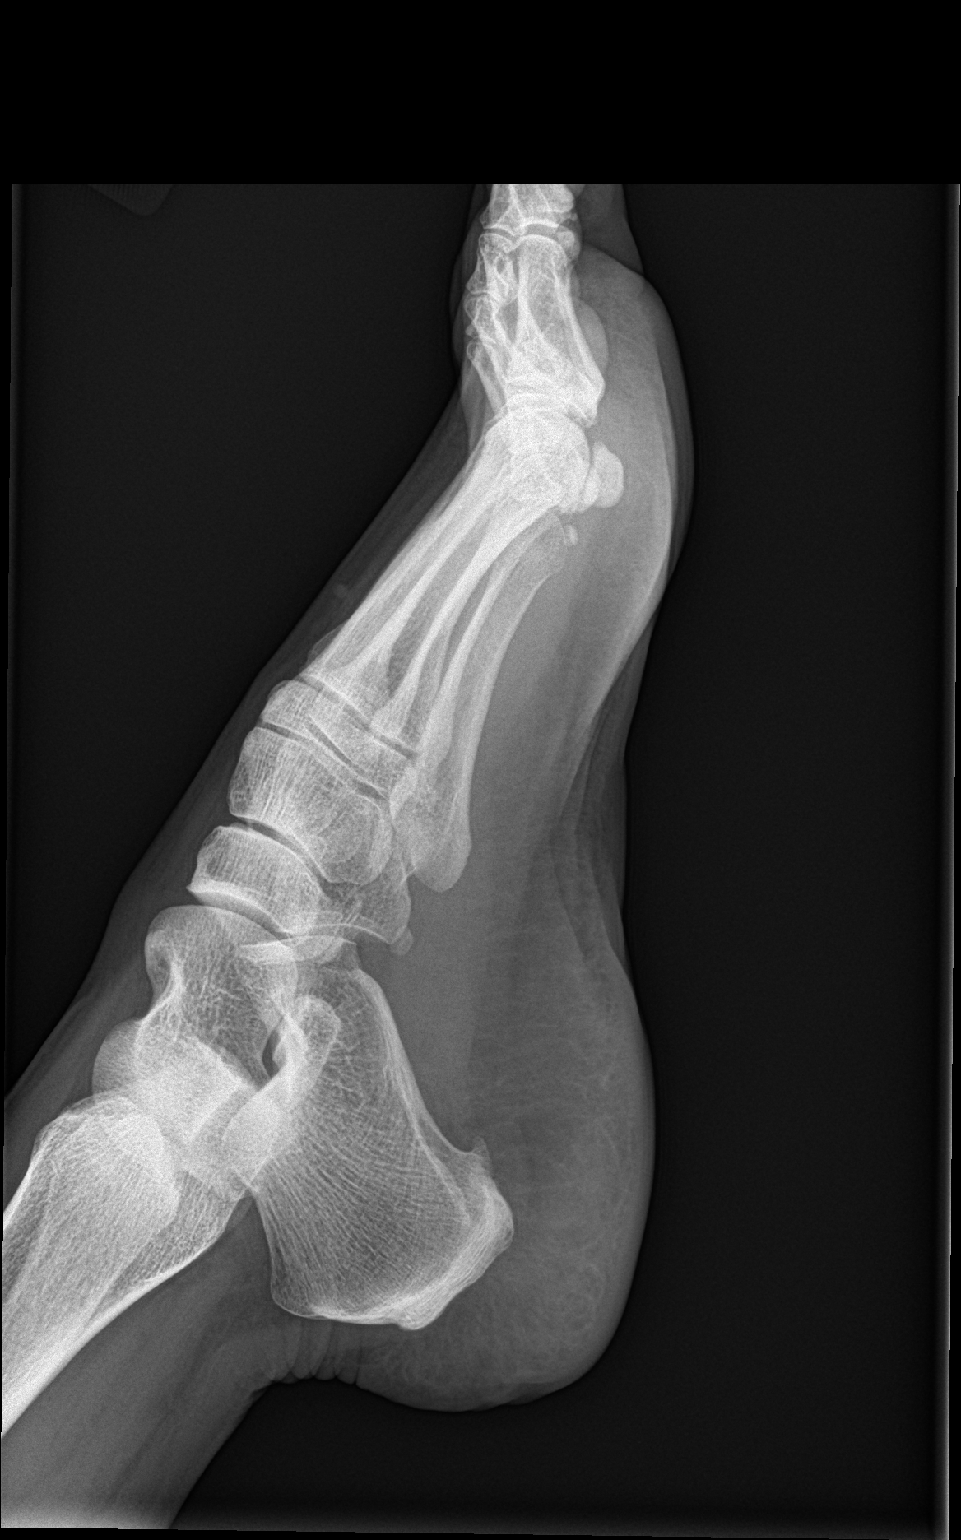

[3 of 3 positions shown; findings below may reference images not displayed]

FINDINGS: Bone mineralization is within normal limits. Calcaneus intact with
mild to moderate plantar surface degenerative spurring. Tarsal bone
alignment and joint spaces are normal. Metatarsals appear intact and
normally aligned. Mild to moderate for age joint space loss,
subchondral sclerosis and degenerative spurring at the first MTP
joint. Phalanges appear intact and normally aligned. Other distal
joint spaces are within normal limits.
IMPRESSION: 1.  No acute osseous abnormality identified.
2. Moderate osteoarthritis at the first MTP joint.
3. Mild to moderate for age plantar surface degenerative spurring at
the calcaneus.

## 2018-04-18 ENCOUNTER — Encounter: Payer: Commercial Managed Care - PPO | Admitting: Family Medicine

## 2018-05-19 ENCOUNTER — Other Ambulatory Visit: Payer: Self-pay

## 2018-05-19 ENCOUNTER — Encounter: Payer: Self-pay | Admitting: Family Medicine

## 2018-05-19 ENCOUNTER — Ambulatory Visit (INDEPENDENT_AMBULATORY_CARE_PROVIDER_SITE_OTHER): Payer: Commercial Managed Care - PPO | Admitting: Family Medicine

## 2018-05-19 VITALS — BP 104/66 | HR 60 | Temp 98.2°F | Ht 62.0 in | Wt 120.2 lb

## 2018-05-19 DIAGNOSIS — E559 Vitamin D deficiency, unspecified: Secondary | ICD-10-CM

## 2018-05-19 DIAGNOSIS — R7303 Prediabetes: Secondary | ICD-10-CM

## 2018-05-19 DIAGNOSIS — Z130 Encounter for screening for diseases of the blood and blood-forming organs and certain disorders involving the immune mechanism: Secondary | ICD-10-CM

## 2018-05-19 DIAGNOSIS — Z13220 Encounter for screening for lipoid disorders: Secondary | ICD-10-CM

## 2018-05-19 DIAGNOSIS — Z23 Encounter for immunization: Secondary | ICD-10-CM | POA: Diagnosis not present

## 2018-05-19 DIAGNOSIS — H9192 Unspecified hearing loss, left ear: Secondary | ICD-10-CM

## 2018-05-19 DIAGNOSIS — Z0000 Encounter for general adult medical examination without abnormal findings: Secondary | ICD-10-CM

## 2018-05-19 NOTE — Patient Instructions (Addendum)
Cuidados preventivos en las mujeres de 40 a 64 aos de edad  Preventive Care 40-64 Years, Female  Los cuidados preventivos hacen referencia a las opciones en cuanto al estilo de vida y a las visitas al mdico, las cuales pueden promover la salud y el bienestar.  Qu incluyen los cuidados preventivos?   Un examen fsico anual. Esto tambin se conoce como control de bienestar anual.   Exmenes dentales una o dos veces al ao.   Exmenes de la vista de rutina. Pregntele al mdico con qu frecuencia debe realizarse un control de la vista.   Opciones personales de estilo de vida, que incluyen lo siguiente:  ? Cuidarse los dientes y las encas a diario.  ? Realizar actividad fsica con regularidad.  ? Tener una dieta saludable.  ? Evitar el consumo de tabaco y drogas.  ? Limitar el consumo de bebidas alcohlicas.  ? Practicar el sexo seguro.  ? Tomar una dosis baja de aspirina diariamente a partir de los 50 aos de edad.  ? Tomar los suplementos de vitaminas o minerales como se lo haya indicado el mdico.  Qu sucede durante un control de bienestar anual?  Los servicios y exmenes de deteccin realizados por su mdico durante el control de bienestar anual dependern de su salud general, factores de riesgo de estilo de vida y los antecedentes familiares de enfermedades.  Asesoramiento  Su mdico puede preguntarle acerca de:   Consumo de alcohol.   Consumo de tabaco.   Consumo de drogas.   Bienestar emocional.   Bienestar en el hogar y las relaciones personales.   Actividad sexual.   Hbitos de alimentacin.   Trabajo y ambiente laboral.   Mtodos anticonceptivos.   El ciclo menstrual.   Antecedentes de embarazo.    Pruebas de deteccin  Pueden hacerle las siguientes pruebas o mediciones:   Estatura, peso e ndice de masa muscular (IMC).   Presin arterial.   Niveles de lpidos y colesterol. Estos se pueden verificar cada 5 aos o, con ms frecuencia, si usted tiene ms de 50 aos de edad.   Control de  la piel.   Pruebas de deteccin de cncer de pulmn. Es posible que se le realice esta prueba de deteccin a partir de los 55 aos de edad, si ha fumado durante 30 aos un paquete diario y sigue fumando o dej el hbito en algn momento en los ltimos 15 aos.   Prueba de sangre oculta en las heces (SOH). Es posible que se le realice esta prueba todos los aos a partir de los 50 aos de edad.   Sigmoidoscopa o colonoscopa flexible. Es posible que se le realice una sigmoidoscopa cada 5 aos o una colonoscopa cada 10 aos a partir de los 50 aos de edad.   Anlisis de sangre para la deteccin de la hepatitis C.   Anlisis de sangre para la deteccin de la hepatitis B.   Anlisis de enfermedades de transmisin sexual (ETS).   Pruebas de deteccin de la diabetes. Esto se realiza mediante un control del azcar en la sangre (glucosa) despus de no haber comido durante un periodo de tiempo (ayuno). Es posible que se le realice esta prueba cada 1 a 3 tres aos.   Mamografa. Se puede realizar cada 1 o 2 aos. Hable con su mdico sobre cundo debe comenzar a realizarse mamografas de manera regular. Esto depende de si tiene antecedentes familiares de cncer de mama o no.   Pruebas de deteccin de cncer relacionado con   las mutaciones del BRCA. Es posible que se los deba realizar si tiene antecedentes de cncer de mama, de ovario, de trompas o peritoneal.   Examen plvico y prueba de Papanicolaou. Esto se puede realizar cada 3aos a partir de los 21aos de edad. A partir de los 30 aos, esto se puede realizar cada 5 aos si usted se realiza una prueba de Papanicolaou en combinacin con una prueba de deteccin del virus del papiloma humano (VPH).   Densitometra sea. Esto se realiza para detectar osteoporosis. Se le puede realizar este examen de deteccin si tiene un riesgo alto de tener osteoporosis.    Hable con su mdico para analizar los resultados, las opciones de tratamiento y, si corresponde, la  necesidad de realizar ms pruebas.  Vacunas  El mdico puede recomendarle que se aplique algunas vacunas, por ejemplo:   Vacuna contra la gripe. Se recomienda aplicarse esta vacuna todos los aos.   Vacuna contra la difteria, ttanos y tos ferina acelular (DTPa, DT). Es posible que tenga que aplicarse un refuerzo contra el ttanos y la difteria (DT) cada 10aos.   Vacuna contra la varicela. Es posible que tenga que aplicrsela si no recibi esta vacuna.   Vacuna contra el herpes zster. Es posible que la necesite despus de los 60 aos de edad.   Vacuna contra el sarampin, rubola y paperas (SRP). Es posible que necesite aplicarse al menos una dosis de la vacuna SRP si naci despus de 1957. Podra tambin necesitar una segunda dosis.   Vacuna antineumoccica conjugada 13 valente (PCV13). Puede necesitar esta vacuna si tiene determinadas enfermedades y no se vacun anteriormente.   Vacuna antineumoccica de polisacridos (PPSV23). Quizs tenga que aplicarse una o dos dosis si fuma o si sufre determinadas enfermedades.   Vacuna antimeningoccica. Puede necesitar esta vacuna si tiene determinadas afecciones.   Vacuna contra la hepatitis A. Es posible que necesite esta vacuna si tiene ciertas afecciones o si viaja o trabaja en lugares en los que podra estar expuesto a la hepatitis A.   Vacuna contra la hepatitis B. Es posible que necesite esta vacuna si tiene ciertas afecciones o si viaja o trabaja en lugares en los que podra estar expuesto a la hepatitis B.   Vacuna contra antihaemophilus influenzae tipoB (Hib). Puede necesitar esta vacuna si tiene determinadas afecciones.    Hable con el mdico sobre qu pruebas de deteccin y qu vacunas necesita, y con qu frecuencia las necesita.  Esta informacin no tiene como fin reemplazar el consejo del mdico. Asegrese de hacerle al mdico cualquier pregunta que tenga.  Document Released: 10/13/2016 Document Revised: 10/13/2016 Document Reviewed:  04/02/2015  Elsevier Interactive Patient Education  2018 Elsevier Inc.

## 2018-05-19 NOTE — Progress Notes (Signed)
12/5/201910:06 AM  Frederik Pear 06/05/1971, 47 y.o. female [id number]  Chief Complaint  Patient presents with  . Annual Exam    HPI:   Patient is a 47 y.o. female with past medical history significant for pre-diabetes and vitamin D deficiency who presents today for CPE  Last CPE July 2018 Cervical Cancer Screening: feb 2019 with gyn Breast Cancer Screening:march 2019 with gyn Colorectal Cancer Screening: at age 51 Bone Density Testing: at age 35 HIV Screening: 2015 STI Screening: 2015 Seasonal Influenza Vaccination: this season Td/Tdap Vaccination: 2014 Pneumococcal Vaccination: at age 81 Zoster Vaccination: at age 16 Frequency of Dental evaluation: Q6 months Frequency of Eye evaluation: yearly  Fall Risk  05/19/2018 01/06/2018 06/25/2017 01/21/2017 01/01/2017  Falls in the past year? 0 No No No No     Depression screen [facility] 2/9 05/19/2018 01/06/2018 06/25/2017  Decreased Interest 0 0 0  Down, Depressed, Hopeless 0 0 0  PHQ - 2 Score 0 0 0  Altered sleeping - - -  Tired, decreased energy - - -  Change in appetite - - -  Feeling bad or failure about yourself  - - -  Trouble concentrating - - -  Moving slowly or fidgety/restless - - -  Suicidal thoughts - - -  PHQ-9 Score - - -  Difficult doing work/chores - - -    No Known Allergies  Prior to Admission medications   Medication Sig Start Date End Date Taking? Authorizing Provider  medroxyPROGESTERone (PROVERA) 5 MG tablet Take 1 tablet (5 mg total) by mouth daily for 10 days. Every 3 months if no spontaneous menstrual period. 08/05/17 08/15/17  Princess Bruins, MD    Past Medical History:  Diagnosis Date  . Depression   . Prediabetes   . Vitamin D deficiency     Past Surgical History:  Procedure Laterality Date  . CESAREAN SECTION  06/29/06    REPEAT C/S W BTSP  . TUBAL LIGATION  06-29-06   AT TIME OF REPEAT C/S    Social History   Tobacco Use  . Smoking status: Never Smoker  . Smokeless tobacco:  Never Used  Substance Use Topics  . Alcohol use: No    Alcohol/week: 0.0 standard drinks    Family History  Problem Relation Age of Onset  . Other Maternal Aunt        Cancer in blood  . Kidney disease Father   . Kidney disease Sister   . Colon cancer Neg Hx   . Colon polyps Neg Hx   . Diabetes Neg Hx   . Esophageal cancer Neg Hx   . Gallbladder disease Neg Hx   . Heart disease Neg Hx   . Breast cancer Neg Hx     Review of Systems  Constitutional: Positive for malaise/fatigue. Negative for chills, fever and weight loss.  HENT: Positive for hearing loss (left ear, chronic) and tinnitus.   Respiratory: Positive for shortness of breath. Negative for cough.   Cardiovascular: Negative for chest pain, palpitations and leg swelling.  Gastrointestinal: Negative for abdominal pain, nausea and vomiting.  Genitourinary: Negative for dysuria and hematuria.  Musculoskeletal: Positive for joint pain.       Neg joint swelling, erythema or warmth  Skin: Negative for rash.       Nail changes  Neurological: Negative for dizziness, focal weakness and headaches.  Psychiatric/Behavioral: Negative for depression. The patient is nervous/anxious. The patient does not have insomnia.   All other systems reviewed and are negative.  OBJECTIVE:  Blood pressure 104/66, pulse 60, temperature 98.2 F (36.8 C), temperature source Oral, height '5\' 2"'  (1.575 m), weight 120 lb 3.2 oz (54.5 kg), SpO2 100 %. Body mass index is 21.98 kg/m.    Visual Acuity Screening   Right eye Left eye Both eyes  Without correction: '20/30 20/30 20/30 '  With correction:       Wt Readings from Last 3 Encounters:  05/19/18 120 lb 3.2 oz (54.5 kg)  01/06/18 120 lb 3.2 oz (54.5 kg)  08/05/17 120 lb (54.4 kg)    Physical Exam  Constitutional: She is oriented to person, place, and time. She appears well-developed and well-nourished.  HENT:  Head: Normocephalic and atraumatic.  Right Ear: Hearing, tympanic membrane,  external ear and ear canal normal.  Left Ear: Hearing, tympanic membrane, external ear and ear canal normal.  Mouth/Throat: Oropharynx is clear and moist.  Eyes: Pupils are equal, round, and reactive to light. Conjunctivae and EOM are normal.  Neck: Neck supple. No thyromegaly present.  Cardiovascular: Normal rate, regular rhythm, normal heart sounds and intact distal pulses. Exam reveals no gallop and no friction rub.  No murmur heard. Pulmonary/Chest: Effort normal and breath sounds normal. She has no wheezes. She has no rales.  Abdominal: Soft. Bowel sounds are normal. She exhibits no distension and no mass. There is no tenderness.  Musculoskeletal: Normal range of motion. She exhibits no edema.  Lymphadenopathy:    She has no cervical adenopathy.  Neurological: She is alert and oriented to person, place, and time. She has normal reflexes. No cranial nerve deficit. Gait normal.  Skin: Skin is warm and dry.  Psychiatric: She has a normal mood and affect.  Nursing note and vitals reviewed.   ASSESSMENT and PLAN  1. Annual physical exam Routine HCM labs ordered. HCM reviewed/discussed. Anticipatory guidance regarding healthy weight, lifestyle and choices given.   2. Need for prophylactic vaccination and inoculation against influenza - Flu Vaccine QUAD 36+ mos IM  3. Prediabetes - TSH - CMP14+EGFR - Hemoglobin A1c  4. Screening for deficiency anemia - CBC with Differential/Platelet  5. Screening for lipoid disorders - Lipid panel  6. Vitamin D deficiency - Vitamin D, 25-hydroxy    Return in about 6 weeks (around 06/30/2018) for other medical concerns.    Rutherford Guys, MD Primary Care at [facility], [place] [address] Ph.  [phone] Fax [phone]

## 2018-05-20 LAB — CBC WITH DIFFERENTIAL/PLATELET
Basophils Absolute: 0 10*3/uL (ref 0.0–0.2)
Basos: 1 %
EOS (ABSOLUTE): 0.1 10*3/uL (ref 0.0–0.4)
Eos: 2 %
Hematocrit: 40.8 % (ref 34.0–46.6)
Hemoglobin: 14.2 g/dL (ref 11.1–15.9)
Immature Grans (Abs): 0 10*3/uL (ref 0.0–0.1)
Immature Granulocytes: 0 %
Lymphocytes Absolute: 1.7 10*3/uL (ref 0.7–3.1)
Lymphs: 37 %
MCH: 31.4 pg (ref 26.6–33.0)
MCHC: 34.8 g/dL (ref 31.5–35.7)
MCV: 90 fL (ref 79–97)
Monocytes Absolute: 0.3 10*3/uL (ref 0.1–0.9)
Monocytes: 7 %
Neutrophils Absolute: 2.4 10*3/uL (ref 1.4–7.0)
Neutrophils: 53 %
Platelets: 294 10*3/uL (ref 150–450)
RBC: 4.52 x10E6/uL (ref 3.77–5.28)
RDW: 12.7 % (ref 12.3–15.4)
WBC: 4.5 10*3/uL (ref 3.4–10.8)

## 2018-05-20 LAB — HEMOGLOBIN A1C
Est. average glucose Bld gHb Est-mCnc: 120 mg/dL
Hgb A1c MFr Bld: 5.8 % — ABNORMAL HIGH (ref 4.8–5.6)

## 2018-05-20 LAB — LIPID PANEL
Chol/HDL Ratio: 3.6 ratio (ref 0.0–4.4)
Cholesterol, Total: 189 mg/dL (ref 100–199)
HDL: 53 mg/dL (ref >39)
LDL Calculated: 111 mg/dL — ABNORMAL HIGH (ref 0–99)
Triglycerides: 126 mg/dL (ref 0–149)
VLDL Cholesterol Cal: 25 mg/dL (ref 5–40)

## 2018-05-20 LAB — CMP14+EGFR
ALT: 22 IU/L (ref 0–32)
AST: 16 IU/L (ref 0–40)
Albumin/Globulin Ratio: 2.2 (ref 1.2–2.2)
Albumin: 4.7 g/dL (ref 3.5–5.5)
Alkaline Phosphatase: 80 IU/L (ref 39–117)
BUN/Creatinine Ratio: 18 (ref 9–23)
BUN: 12 mg/dL (ref 6–24)
Bilirubin Total: 0.5 mg/dL (ref 0.0–1.2)
CO2: 24 mmol/L (ref 20–29)
Calcium: 9.7 mg/dL (ref 8.7–10.2)
Chloride: 101 mmol/L (ref 96–106)
Creatinine, Ser: 0.66 mg/dL (ref 0.57–1.00)
GFR calc Af Amer: 122 mL/min/{1.73_m2} (ref >59)
GFR calc non Af Amer: 106 mL/min/{1.73_m2} (ref >59)
Globulin, Total: 2.1 g/dL (ref 1.5–4.5)
Glucose: 90 mg/dL (ref 65–99)
Potassium: 4.3 mmol/L (ref 3.5–5.2)
Sodium: 140 mmol/L (ref 134–144)
Total Protein: 6.8 g/dL (ref 6.0–8.5)

## 2018-05-20 LAB — VITAMIN D 25 HYDROXY (VIT D DEFICIENCY, FRACTURES): Vit D, 25-Hydroxy: 21 ng/mL — ABNORMAL LOW (ref 30.0–100.0)

## 2018-05-20 LAB — TSH: TSH: 3.51 u[IU]/mL (ref 0.450–4.500)

## 2018-07-11 ENCOUNTER — Ambulatory Visit: Payer: Commercial Managed Care - PPO | Admitting: Family Medicine

## 2018-08-04 ENCOUNTER — Ambulatory Visit: Payer: Commercial Managed Care - PPO | Attending: Family Medicine | Admitting: Audiology

## 2018-08-08 ENCOUNTER — Encounter: Payer: Commercial Managed Care - PPO | Admitting: Obstetrics & Gynecology

## 2018-08-22 ENCOUNTER — Encounter: Payer: Self-pay | Admitting: Family Medicine

## 2018-08-22 ENCOUNTER — Other Ambulatory Visit: Payer: Self-pay

## 2018-08-22 ENCOUNTER — Ambulatory Visit (INDEPENDENT_AMBULATORY_CARE_PROVIDER_SITE_OTHER): Payer: Commercial Managed Care - PPO | Admitting: Family Medicine

## 2018-08-22 VITALS — BP 110/67 | HR 63 | Temp 98.5°F | Ht 62.0 in | Wt 113.5 lb

## 2018-08-22 DIAGNOSIS — F5104 Psychophysiologic insomnia: Secondary | ICD-10-CM | POA: Diagnosis not present

## 2018-08-22 DIAGNOSIS — H9192 Unspecified hearing loss, left ear: Secondary | ICD-10-CM

## 2018-08-22 DIAGNOSIS — E559 Vitamin D deficiency, unspecified: Secondary | ICD-10-CM | POA: Diagnosis not present

## 2018-08-22 MED ORDER — TRAZODONE HCL 50 MG PO TABS: 25.0000 mg | ORAL_TABLET | Freq: Every evening | ORAL | 3 refills | Status: DC | PRN

## 2018-08-22 NOTE — Progress Notes (Signed)
3/9/20204:32 PM  Samantha Robertson 1970-10-20, 48 y.o. female [id number]  Chief Complaint  Patient presents with  . Ear Problem    cannot hear out of left     HPI:   Patient is a 48 y.o. 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 0 0 No No No  Number falls in past yr: 0 - - - -  Injury with Fall? 0 - - - -     Depression screen [facility] 2/9 05/19/2018 01/06/2018 06/25/2017  Decreased Interest 0 0 0  Down, Depressed, Hopeless 0 0 0  PHQ - 2 Score 0 0 0  Altered sleeping - - -  Tired, decreased energy - - -  Change in appetite - - -  Feeling bad or failure about yourself  - - -  Trouble concentrating - - -  Moving slowly or fidgety/restless - - -  Suicidal thoughts - - -  PHQ-9 Score - - -  Difficult doing work/chores - - -    No Known Allergies  Prior to Admission medications   Not on File    Past Medical History:  Diagnosis Date  . Depression   . Prediabetes   . Vitamin D deficiency     Past Surgical History:  Procedure Laterality Date  . CESAREAN SECTION  06/29/06    REPEAT C/S W BTSP  . TUBAL LIGATION  06-29-06   AT TIME OF REPEAT C/S    Social History   Tobacco Use  . Smoking status: Never Smoker  .  Smokeless tobacco: Never Used  Substance Use Topics  . Alcohol use: No    Alcohol/week: 0.0 standard drinks    Family History  Problem Relation Age of Onset  . Other Maternal Aunt        Cancer in blood  . Kidney disease Father   . Kidney disease Sister   . Colon cancer Neg Hx   . Colon polyps Neg Hx   . Diabetes Neg Hx   . Esophageal cancer Neg Hx   . Gallbladder disease Neg Hx   . Heart disease Neg Hx   . Breast cancer Neg Hx     ROS Per hpi  OBJECTIVE:  Blood pressure 110/67, pulse 63, temperature 98.5 F (36.9 C), height 5\' 2"  (1.575 m), weight 113 lb 8 oz (51.5 kg), SpO2 98 %. Body mass index is 20.76 kg/m.   Physical Exam Vitals signs and nursing note reviewed.  Constitutional:      Appearance: She is well-developed.  HENT:     Head: Normocephalic and atraumatic.     Right Ear: Hearing, tympanic membrane, ear canal and external ear normal.     Left Ear: Tympanic membrane, ear canal and external ear normal. Decreased hearing noted.  Eyes:     Conjunctiva/sclera: Conjunctivae normal.     Pupils: Pupils are equal, round, and reactive to light.  Neck:     Musculoskeletal: Neck supple.  Cardiovascular:     Rate and Rhythm: Normal rate and regular rhythm.     Heart sounds: Normal heart sounds. No murmur. No friction rub. No gallop.   Pulmonary:     Effort: Pulmonary effort is normal.     Breath sounds: Normal breath sounds. No wheezing or rales.  Lymphadenopathy:     Cervical: No cervical adenopathy.  Skin:    General: Skin is warm and dry.  Neurological:     Mental Status: She is alert and oriented to person, place, and time.     ASSESSMENT and PLAN  1. Hearing loss of left ear, unspecified hearing loss type - Ambulatory referral to ENT  2. Vitamin D deficiency Level 21. Below goal. Discussed starting OTC supplement 3000u a day x 1 month and then down to 2000u a d ay  3. Psychophysiological insomnia - traZODone (DESYREL) 50 MG tablet; Take 0.5-1  tablets (25-50 mg total) by mouth at bedtime as needed for sleep.  Return if symptoms worsen or fail to improve.    Rutherford Guys, MD Primary Care at [place], [place] [address] Ph.  [phone] Fax [phone]

## 2018-08-22 NOTE — Patient Instructions (Addendum)
  empezar vitamin d and biotin   If you have lab work done today you will be contacted with your lab results within the next 2 weeks.  If you have not heard from [place] then please contact us. The fastest way to get your results is to register for My Chart.   IF you received an x-ray today, you will receive an invoice from [facility]. Please contact [facility] at [phone] with questions or concerns regarding your invoice.   IF you received labwork today, you will receive an invoice from [facility]. Please contact [facility] at [phone] with questions or concerns regarding your invoice.   Our billing staff will not be able to assist you with questions regarding bills from these companies.  You will be contacted with the lab results as soon as they are available. The fastest way to get your results is to activate your My Chart account. Instructions are located on the last page of this paperwork. If you have not heard from [place] regarding the results in 2 weeks, please contact this office.

## 2018-09-08 ENCOUNTER — Other Ambulatory Visit: Payer: Self-pay

## 2018-09-09 ENCOUNTER — Encounter: Payer: Self-pay | Admitting: Obstetrics & Gynecology

## 2018-09-09 ENCOUNTER — Ambulatory Visit (INDEPENDENT_AMBULATORY_CARE_PROVIDER_SITE_OTHER): Payer: Commercial Managed Care - PPO | Admitting: Obstetrics & Gynecology

## 2018-09-09 VITALS — BP 124/78 | Ht 60.0 in | Wt 114.0 lb

## 2018-09-09 DIAGNOSIS — Z9851 Tubal ligation status: Secondary | ICD-10-CM | POA: Diagnosis not present

## 2018-09-09 DIAGNOSIS — Z01419 Encounter for gynecological examination (general) (routine) without abnormal findings: Secondary | ICD-10-CM | POA: Diagnosis not present

## 2018-09-09 DIAGNOSIS — R87610 Atypical squamous cells of undetermined significance on cytologic smear of cervix (ASC-US): Secondary | ICD-10-CM | POA: Diagnosis not present

## 2018-09-09 DIAGNOSIS — N951 Menopausal and female climacteric states: Secondary | ICD-10-CM | POA: Diagnosis not present

## 2018-09-09 NOTE — Progress Notes (Signed)
Samantha Robertson 25-Aug-1970 [id number]   History:    48 y.o. G3P3L3 Married.  Status post tubal ligation.  RP:  Established patient presenting for annual gyn exam   HPI: Oligomenorrhea with menstrual periods about every 3 months in the past year with light flow.  No breakthrough bleeding.  No pelvic pain.  Occasional mild hot flushes and night sweats.  Positional superficial dyspareunia intermittently.  Urine normal.  Tendency for constipation.  Breast normal.  Body mass index 22.26.  Needs to exercise more.  Fasting health labs here today.  Past medical history,surgical history, family history and social history were all reviewed and documented in the EPIC chart.  Gynecologic History No LMP recorded. (Menstrual status: Perimenopausal). Contraception: tubal ligation Last Pap: 07/2017. Results were: ASCUS/HPV HR neg Last mammogram: 08/2017. Results were: Negative Bone Density: Never Colonoscopy: Never  Obstetric History OB History  Gravida Para Term Preterm AB Living  '3 3 2 1   3  ' SAB TAB Ectopic Multiple Live Births          3    # Outcome Date GA Lbr Len/2nd Weight Sex Delivery Anes PTL Lv  3 Term     F CS-Unspec  N LIV  2 Term     F CS-Unspec  N LIV  1 Preterm     M Vag-Spont  Y LIV     ROS: A ROS was performed and pertinent positives and negatives are included in the history.  GENERAL: No fevers or chills. HEENT: No change in vision, no earache, sore throat or sinus congestion. NECK: No pain or stiffness. CARDIOVASCULAR: No chest pain or pressure. No palpitations. PULMONARY: No shortness of breath, cough or wheeze. GASTROINTESTINAL: No abdominal pain, nausea, vomiting or diarrhea, melena or bright red blood per rectum. GENITOURINARY: No urinary frequency, urgency, hesitancy or dysuria. MUSCULOSKELETAL: No joint or muscle pain, no back pain, no recent trauma. DERMATOLOGIC: No rash, no itching, no lesions. ENDOCRINE: No polyuria, polydipsia, no heat or cold intolerance. No  recent change in weight. HEMATOLOGICAL: No anemia or easy bruising or bleeding. NEUROLOGIC: No headache, seizures, numbness, tingling or weakness. PSYCHIATRIC: No depression, no loss of interest in normal activity or change in sleep pattern.     Exam:   BP 124/78   Ht 5' (1.524 m)   Wt 114 lb (51.7 kg)   BMI 22.26 kg/m   Body mass index is 22.26 kg/m.  General appearance : Well developed well nourished female. No acute distress HEENT: Eyes: no retinal hemorrhage or exudates,  Neck supple, trachea midline, no carotid bruits, no thyroidmegaly Lungs: Clear to auscultation, no rhonchi or wheezes, or rib retractions  Heart: Regular rate and rhythm, no murmurs or gallops Breast:Examined in sitting and supine position were symmetrical in appearance, no palpable masses or tenderness,  no skin retraction, no nipple inversion, no nipple discharge, no skin discoloration, no axillary or supraclavicular lymphadenopathy Abdomen: no palpable masses or tenderness, no rebound or guarding Extremities: no edema or skin discoloration or tenderness  Pelvic: Vulva: Normal             Vagina: No gross lesions or discharge  Cervix: No gross lesions or discharge.  Pap reflex done.  Uterus  AV, normal size, shape and consistency, non-tender and mobile  Adnexa  Without masses or tenderness  Anus: Normal   Assessment/Plan:  48 y.o. female for annual exam   1. Encounter for routine gynecological examination with Papanicolaou smear of cervix Normal gynecologic exam.  ASCUS with  negative high-risk HPV last year, Pap reflex done today.  Breast exam normal.  Will schedule screening mammogram now.  Fasting health labs here today. - CBC - Comp Met (CMET) - TSH - Lipid panel - VITAMIN D 25 Hydroxy (Vit-D Deficiency, Fractures)  2. ASCUS of cervix with negative high risk HPV Pap reflex done   3. S/P tubal ligation  4. Perimenopausal Oligomenorrhea with light menses every 3 months.  Precautions discussed,  will observe.  Princess Bruins MD, 8:21 AM 09/09/2018

## 2018-09-09 NOTE — Patient Instructions (Signed)
1. Encounter for routine gynecological examination with Papanicolaou smear of cervix Normal gynecologic exam.  ASCUS with negative high-risk HPV last year, Pap reflex done today.  Breast exam normal.  Will schedule screening mammogram now.  Fasting health labs here today. - CBC - Comp Met (CMET) - TSH - Lipid panel - VITAMIN D 25 Hydroxy (Vit-D Deficiency, Fractures)  2. ASCUS of cervix with negative high risk HPV Pap reflex done   3. S/P tubal ligation  4. Perimenopausal Oligomenorrhea with light menses every 3 months.  Precautions discussed, will observe.  Brihana, fue un placer verle hoy!  Voy a informarle de sus [employer].

## 2018-09-10 LAB — CBC
HEMATOCRIT: 38.4 % (ref 35.0–45.0)
HEMOGLOBIN: 13.2 g/dL (ref 11.7–15.5)
MCH: 31.9 pg (ref 27.0–33.0)
MCHC: 34.4 g/dL (ref 32.0–36.0)
MCV: 92.8 fL (ref 80.0–100.0)
MPV: 10.4 fL (ref 7.5–12.5)
Platelets: 257 10*3/uL (ref 140–400)
RBC: 4.14 10*6/uL (ref 3.80–5.10)
RDW: 13 % (ref 11.0–15.0)
WBC: 3.6 10*3/uL — AB (ref 3.8–10.8)

## 2018-09-10 LAB — LIPID PANEL
Cholesterol: 184 mg/dL (ref <200)
HDL: 52 mg/dL (ref >=50)
LDL Cholesterol (Calc): 115 mg/dL (calc) — ABNORMAL HIGH
NON-HDL CHOLESTEROL (CALC): 132 mg/dL — AB (ref <130)
Total CHOL/HDL Ratio: 3.5 (calc) (ref <5.0)
Triglycerides: 78 mg/dL (ref <150)

## 2018-09-10 LAB — COMPREHENSIVE METABOLIC PANEL
AG RATIO: 2.2 (calc) (ref 1.0–2.5)
ALT: 20 U/L (ref 6–29)
AST: 16 U/L (ref 10–35)
Albumin: 4.6 g/dL (ref 3.6–5.1)
Alkaline phosphatase (APISO): 67 U/L (ref 31–125)
BUN: 16 mg/dL (ref 7–25)
CHLORIDE: 105 mmol/L (ref 98–110)
CO2: 32 mmol/L (ref 20–32)
Calcium: 9.3 mg/dL (ref 8.6–10.2)
Creat: 0.75 mg/dL (ref 0.50–1.10)
GLOBULIN: 2.1 g/dL (ref 1.9–3.7)
GLUCOSE: 84 mg/dL (ref 65–99)
POTASSIUM: 4.3 mmol/L (ref 3.5–5.3)
SODIUM: 140 mmol/L (ref 135–146)
TOTAL PROTEIN: 6.7 g/dL (ref 6.1–8.1)
Total Bilirubin: 0.7 mg/dL (ref 0.2–1.2)

## 2018-09-10 LAB — VITAMIN D 25 HYDROXY (VIT D DEFICIENCY, FRACTURES): Vit D, 25-Hydroxy: 18 ng/mL — ABNORMAL LOW (ref 30–100)

## 2018-09-10 LAB — TSH: TSH: 1.53 mIU/L

## 2018-09-12 LAB — PAP IG W/ RFLX HPV ASCU

## 2019-01-26 ENCOUNTER — Encounter: Payer: Self-pay | Admitting: Family Medicine

## 2019-01-26 ENCOUNTER — Ambulatory Visit (INDEPENDENT_AMBULATORY_CARE_PROVIDER_SITE_OTHER): Payer: Commercial Managed Care - PPO | Admitting: Family Medicine

## 2019-01-26 ENCOUNTER — Other Ambulatory Visit: Payer: Self-pay

## 2019-01-26 VITALS — BP 109/70 | HR 62 | Temp 98.0°F | Ht 60.0 in | Wt 111.0 lb

## 2019-01-26 DIAGNOSIS — R5383 Other fatigue: Secondary | ICD-10-CM

## 2019-01-26 DIAGNOSIS — R1013 Epigastric pain: Secondary | ICD-10-CM | POA: Diagnosis not present

## 2019-01-26 DIAGNOSIS — E559 Vitamin D deficiency, unspecified: Secondary | ICD-10-CM | POA: Diagnosis not present

## 2019-01-26 DIAGNOSIS — R202 Paresthesia of skin: Secondary | ICD-10-CM | POA: Diagnosis not present

## 2019-01-26 MED ORDER — OMEPRAZOLE 20 MG PO CPDR: 20.0000 mg | DELAYED_RELEASE_CAPSULE | Freq: Every day | ORAL | 3 refills | Status: DC

## 2019-01-26 NOTE — Patient Instructions (Signed)
° ° ° °  If you have lab work done today you will be contacted with your lab results within the next 2 weeks.  If you have not heard from us then please contact us. The fastest way to get your results is to register for My Chart. ° ° °IF you received an x-ray today, you will receive an invoice from [facility]. Please contact [facility] at [phone] with questions or concerns regarding your invoice.  ° °IF you received labwork today, you will receive an invoice from [facility]. Please contact [facility] at [phone] with questions or concerns regarding your invoice.  ° °Our billing staff will not be able to assist you with questions regarding bills from these companies. ° °You will be contacted with the lab results as soon as they are available. The fastest way to get your results is to activate your My Chart account. Instructions are located on the last page of this paperwork. If you have not heard from us regarding the results in 2 weeks, please contact this office. °  ° ° ° °

## 2019-01-26 NOTE — Progress Notes (Signed)
8/13/20204:43 PM  Frederik Pear [birth year], 48 y.o., female [id number]  Chief Complaint  Patient presents with  . Pain    feet an ankle pain    HPI:   Patient is a 48 y.o. female who presents today for heel pain  Bilateral heel pain for since she got covid Pain and burning of legs Has been wearing orthotics and compression stockings She had covid about 3 months ago Worried about blood circulation in her legs Feels legs heavy and tingling, not affected by activity Having bilateral low back pain but no midline pain Getting tired easily Denies any polyuria or polydipsia No nausea or vomiting, but having burning epigastric pain when she eats, denies black tarry stools  Wt Readings from Last 3 Encounters:  01/26/19 111 lb (50.3 kg)  09/09/18 114 lb (51.7 kg)  08/22/18 113 lb 8 oz (51.5 kg)     Depression screen [facility] 2/9 01/26/2019 05/19/2018 01/06/2018  Decreased Interest 0 0 0  Down, Depressed, Hopeless 0 0 0  PHQ - 2 Score 0 0 0  Altered sleeping - - -  Tired, decreased energy - - -  Change in appetite - - -  Feeling bad or failure about yourself  - - -  Trouble concentrating - - -  Moving slowly or fidgety/restless - - -  Suicidal thoughts - - -  PHQ-9 Score - - -  Difficult doing work/chores - - -    Fall Risk  01/26/2019 08/22/2018 05/19/2018 01/06/2018 06/25/2017  Falls in the past year? 0 0 0 No No  Number falls in past yr: 0 0 - - -  Injury with Fall? 0 0 - - -     No Known Allergies  Prior to Admission medications   Not on File    Past Medical History:  Diagnosis Date  . Depression   . Prediabetes   . Vitamin D deficiency     Past Surgical History:  Procedure Laterality Date  . CESAREAN SECTION  06/29/06    REPEAT C/S W BTSP  . TUBAL LIGATION  06-29-06   AT TIME OF REPEAT C/S    Social History   Tobacco Use  . Smoking status: Never Smoker  . Smokeless tobacco: Never Used  Substance Use Topics  . Alcohol use: No    Alcohol/week: 0.0  standard drinks    Family History  Problem Relation Age of Onset  . Other Maternal Aunt        Cancer in blood  . Kidney disease Father   . Kidney disease Sister   . Colon cancer Neg Hx   . Colon polyps Neg Hx   . Diabetes Neg Hx   . Esophageal cancer Neg Hx   . Gallbladder disease Neg Hx   . Heart disease Neg Hx   . Breast cancer Neg Hx     ROS Per hpi  OBJECTIVE:  Today's Vitals   01/26/19 1618  BP: 109/70  Pulse: 62  Temp: 98 F (36.7 C)  TempSrc: Oral  SpO2: 96%  Weight: 111 lb (50.3 kg)  Height: 5' (1.524 m)   Body mass index is 21.68 kg/m.   Physical Exam Vitals signs and nursing note reviewed.  Constitutional:      Appearance: She is well-developed.  HENT:     Head: Normocephalic and atraumatic.     Mouth/Throat:     Pharynx: No oropharyngeal exudate.  Eyes:     General: No scleral icterus.    Conjunctiva/sclera: Conjunctivae normal.  Pupils: Pupils are equal, round, and reactive to light.  Neck:     Musculoskeletal: Neck supple.  Cardiovascular:     Rate and Rhythm: Normal rate and regular rhythm.     Heart sounds: Normal heart sounds. No murmur. No friction rub. No gallop.   Pulmonary:     Effort: Pulmonary effort is normal.     Breath sounds: Normal breath sounds. No wheezing or rales.  Abdominal:     General: Bowel sounds are normal. There is no distension.     Palpations: Abdomen is soft. There is no hepatomegaly, splenomegaly or mass.     Tenderness: There is abdominal tenderness in the epigastric area. There is no guarding or rebound.  Musculoskeletal:     Comments: Lumbar back with TTP across waist line, no bon TTP, neg SLR BLE strength 5/5, +2 DTRs, normal pedal pulses Deceased monofilament sensation of B big toes and associated foot pad TTP at bilateral heels and along right plantar fascia  Lymphadenopathy:     Cervical: No cervical adenopathy.  Skin:    General: Skin is warm and dry.  Neurological:     Mental Status: She is  alert and oriented to person, place, and time.     ASSESSMENT and PLAN  1. Fatigue, unspecified type - CBC - TSH - Comprehensive metabolic panel  2. Paresthesia of both lower extremities - Hemoglobin A1c - Vitamin B12  3. Vitamin D deficiency - Vitamin D, 25-hydroxy  4. Abdominal pain, epigastric - H. pylori breath test  Discussed conservative measures for heel pain.  Labs pending given paresthesia with abnormal monofilament. Starting PPI for presumed gastritis. RTC precautions reviewed.   Other orders - omeprazole (PRILOSEC) 20 MG capsule; Take 1 capsule (20 mg total) by mouth daily.  Return in about 4 weeks (around 02/23/2019).    Rutherford Guys, MD Primary Care at [facility] [place], [place] [address] Ph.  [phone] Fax [phone]

## 2019-01-27 LAB — CBC
Hematocrit: 36.9 % (ref 34.0–46.6)
Hemoglobin: 13.4 g/dL (ref 11.1–15.9)
MCH: 33 pg (ref 26.6–33.0)
MCHC: 36.3 g/dL — ABNORMAL HIGH (ref 31.5–35.7)
MCV: 91 fL (ref 79–97)
Platelets: 269 10*3/uL (ref 150–450)
RBC: 4.06 x10E6/uL (ref 3.77–5.28)
RDW: 13.4 % (ref 11.7–15.4)
WBC: 5.3 10*3/uL (ref 3.4–10.8)

## 2019-01-27 LAB — VITAMIN D 25 HYDROXY (VIT D DEFICIENCY, FRACTURES): Vit D, 25-Hydroxy: 20.3 ng/mL — ABNORMAL LOW (ref 30.0–100.0)

## 2019-01-27 LAB — COMPREHENSIVE METABOLIC PANEL
ALT: 12 IU/L (ref 0–32)
AST: 20 IU/L (ref 0–40)
Albumin/Globulin Ratio: 2.4 — ABNORMAL HIGH (ref 1.2–2.2)
Albumin: 4.8 g/dL (ref 3.8–4.8)
Alkaline Phosphatase: 69 IU/L (ref 39–117)
BUN/Creatinine Ratio: 22 (ref 9–23)
BUN: 15 mg/dL (ref 6–24)
Bilirubin Total: 0.3 mg/dL (ref 0.0–1.2)
CO2: 24 mmol/L (ref 20–29)
Calcium: 9.3 mg/dL (ref 8.7–10.2)
Chloride: 106 mmol/L (ref 96–106)
Creatinine, Ser: 0.67 mg/dL (ref 0.57–1.00)
GFR calc Af Amer: 120 mL/min/{1.73_m2} (ref >59)
GFR calc non Af Amer: 104 mL/min/{1.73_m2} (ref >59)
Globulin, Total: 2 g/dL (ref 1.5–4.5)
Glucose: 109 mg/dL — ABNORMAL HIGH (ref 65–99)
Potassium: 4.4 mmol/L (ref 3.5–5.2)
Sodium: 143 mmol/L (ref 134–144)
Total Protein: 6.8 g/dL (ref 6.0–8.5)

## 2019-01-27 LAB — H. PYLORI BREATH TEST: H pylori Breath Test: NEGATIVE

## 2019-01-27 LAB — TSH: TSH: 1.7 u[IU]/mL (ref 0.450–4.500)

## 2019-01-27 LAB — HEMOGLOBIN A1C
Est. average glucose Bld gHb Est-mCnc: 123 mg/dL
Hgb A1c MFr Bld: 5.9 % — ABNORMAL HIGH (ref 4.8–5.6)

## 2019-01-27 LAB — H. PYLORI BREATH COLLECTION

## 2019-01-27 LAB — VITAMIN B12: Vitamin B-12: 418 pg/mL (ref 232–1245)

## 2019-02-09 MED ORDER — VITAMIN D (ERGOCALCIFEROL) 1.25 MG (50000 UNIT) PO CAPS: 50000.0000 [IU] | ORAL_CAPSULE | ORAL | 0 refills | Status: DC

## 2019-02-09 NOTE — Addendum Note (Signed)
Addended by: Rutherford Guys on: 02/09/2019 03:37 PM   Modules accepted: Orders

## 2019-02-10 ENCOUNTER — Encounter: Payer: Self-pay | Admitting: Radiology

## 2019-02-23 ENCOUNTER — Encounter: Payer: Self-pay | Admitting: Family Medicine

## 2019-02-23 ENCOUNTER — Ambulatory Visit (INDEPENDENT_AMBULATORY_CARE_PROVIDER_SITE_OTHER): Payer: Commercial Managed Care - PPO | Admitting: Family Medicine

## 2019-02-23 ENCOUNTER — Other Ambulatory Visit: Payer: Self-pay

## 2019-02-23 VITALS — BP 112/64 | HR 55 | Temp 98.7°F | Ht 60.0 in | Wt 112.4 lb

## 2019-02-23 DIAGNOSIS — Z23 Encounter for immunization: Secondary | ICD-10-CM | POA: Diagnosis not present

## 2019-02-23 DIAGNOSIS — M7732 Calcaneal spur, left foot: Secondary | ICD-10-CM

## 2019-02-23 MED ORDER — MELOXICAM 7.5 MG PO TABS: 7.5000 mg | ORAL_TABLET | Freq: Every day | ORAL | 0 refills | Status: DC

## 2019-02-23 NOTE — Progress Notes (Signed)
9/10/20203:57 PM  Samantha Robertson 04-06-1971, 48 y.o., female [id number]  Chief Complaint  Patient presents with  . Pain    pain in the heel of left foot, not taking anything for the pain    HPI:   Patient is a 48 y.o. female with past medical history routine followup  Seen last month for several concerns of fatigue, epigastric abd pain, heel pain and numbness of big toes Started PPI, labs ordered, conservative measures Low vitamin D but otherwise labs unremarkable Left foot xray in 2018: 1.  No acute osseous abnormality identified. 2. 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 3. Mild to moderate for age plantar surface degenerative spurring at the calcaneus.  Epigastric pain better on PPI Just picked up vitamin D rx Not using heel lift or NSAIDs, using cold water and massage No improvement      Depression screen [facility] 2/9 01/26/2019 05/19/2018 01/06/2018  Decreased Interest 0 0 0  Down, Depressed, Hopeless 0 0 0  PHQ - 2 Score 0 0 0  Altered sleeping - - -  Tired, decreased energy - - -  Change in appetite - - -  Feeling bad or failure about yourself  - - -  Trouble concentrating - - -  Moving slowly or fidgety/restless - - -  Suicidal thoughts - - -  PHQ-9 Score - - -  Difficult doing work/chores - - -    Fall Risk  01/26/2019 08/22/2018 05/19/2018 01/06/2018 06/25/2017  Falls in the past year? 0 0 0 No No  Number falls in past yr: 0 0 - - -  Injury with Fall? 0 0 - - -     No Known Allergies  Prior to Admission medications   Medication Sig Start Date End Date Taking? Authorizing Provider  omeprazole (PRILOSEC) 20 MG capsule Take 1 capsule (20 mg total) by mouth daily. 01/26/19   Rutherford Guys, MD  Vitamin D, Ergocalciferol, (DRISDOL) 1.25 MG (50000 UT) CAPS capsule Take 1 capsule (50,000 Units total) by mouth every 7 (seven) days. 02/09/19   Rutherford Guys, MD    Past Medical History:  Diagnosis Date  . Depression   . Prediabetes   . Vitamin D  deficiency     Past Surgical History:  Procedure Laterality Date  . CESAREAN SECTION  06/29/06    REPEAT C/S W BTSP  . TUBAL LIGATION  06-29-06   AT TIME OF REPEAT C/S    Social History   Tobacco Use  . Smoking status: Never Smoker  . Smokeless tobacco: Never Used  Substance Use Topics  . Alcohol use: No    Alcohol/week: 0.0 standard drinks    Family History  Problem Relation Age of Onset  . Other Maternal Aunt        Cancer in blood  . Kidney disease Father   . Kidney disease Sister   . Colon cancer Neg Hx   . Colon polyps Neg Hx   . Diabetes Neg Hx   . Esophageal cancer Neg Hx   . Gallbladder disease Neg Hx   . Heart disease Neg Hx   . Breast cancer Neg Hx     ROS Per hpi  OBJECTIVE:  Today's Vitals   02/23/19 1556  BP: 112/64  Pulse: (!) 55  Temp: 98.7 F (37.1 C)  SpO2: 98%  Weight: 112 lb 6.4 oz (51 kg)  Height: 5' (1.524 m)   Body mass index is 21.95 kg/m.   Physical Exam Vitals signs  and nursing note reviewed.  Constitutional:      Appearance: She is well-developed.  HENT:     Head: Normocephalic and atraumatic.  Eyes:     General: No scleral icterus.    Conjunctiva/sclera: Conjunctivae normal.     Pupils: Pupils are equal, round, and reactive to light.  Neck:     Musculoskeletal: Neck supple.  Pulmonary:     Effort: Pulmonary effort is normal.  Skin:    General: Skin is warm and dry.  Neurological:     Mental Status: She is alert and oriented to person, place, and time.       No results found for this or any previous visit (from the past 24 hour(s)).  No results found.   ASSESSMENT and PLAN  1. Need for prophylactic vaccination and inoculation against influenza - Flu Vaccine QUAD 36+ mos IM  2. Calcaneal spur of left foot - Ambulatory referral to Podiatry - Apply other splint  Other orders - meloxicam (MOBIC) 7.5 MG tablet; Take 1 tablet (7.5 mg total) by mouth daily.  Return if symptoms worsen or fail to improve.     Rutherford Guys, MD Primary Care at [facility] [place], [place] [address] Ph.  [phone] Fax [phone]

## 2019-02-23 NOTE — Patient Instructions (Addendum)
If you have lab work done today you will be contacted with your lab results within the next 2 weeks.  If you have not heard from [place] then please contact us. The fastest way to get your results is to register for My Chart.   IF you received an x-ray today, you will receive an invoice from [facility]. Please contact [facility] at [phone] with questions or concerns regarding your invoice.   IF you received labwork today, you will receive an invoice from [place]. Please contact [facility] at [phone] with questions or concerns regarding your invoice.   Our billing staff will not be able to assist you with questions regarding bills from these companies.  You will be contacted with the lab results as soon as they are available. The fastest way to get your results is to activate your My Chart account. Instructions are located on the last page of this paperwork. If you have not heard from [place] regarding the results in 2 weeks, please contact this office.     Espoln calcneo [place]  Los espolones calcneos son protuberancias seas que se forman en la parte inferior del hueso del taln (calcneo). Los espolones calcneos son frecuentes. A menudo causan inflamacin en la banda de tejido que [employer] dedos con el hueso del taln (fascia plantar). Esto puede causar dolor en la zona inferior del pie, cerca del taln. Muchas personas con fascitis plantar tambin presentan espolones calcneos. Sin embargo, los espolones no son la causa del dolor de la fascitis plantar. Cules son las causas? Se desconoce la causa exacta de los espolones calcneos. Las [employer] ser las siguientes:  Presin sobre el hueso del taln.  Bandas de tejido (tendones) que tiran del hueso del taln. Qu incrementa el riesgo? Es ms probable que tenga esta afeccin si:  Es mayor de 71 aos.  Tiene sobrepeso.  Padece artritis debido al deterioro (artrosis).  Presenta inflamacin en  la fascia plantar.  Participa en deportes o actividades que incluyen correr o Careers adviser.  [place] zapatos que no tienen buen calce. Cules son los signos o los sntomas? Algunas personas no tienen sntomas. Si tiene sntomas, pueden incluir los siguientes:  Dolor en la parte inferior del taln.  Dolor que empeora al levantarse de la cama por primera vez.  Dolor que empeora despus de caminar o ponerse de pie. Cmo se diagnostica? Esta afeccin se puede diagnosticar en funcin de lo siguiente:  Los sntomas y antecedentes mdicos.  Un examen fsico.  Una radiografa del pie. Cmo se trata? El tratamiento de esta afeccin depende de cunto dolor sienta. Las opciones de tratamiento pueden incluir las siguientes:  Hacer ejercicios de elongacin.  Bajar de [place], si es necesario.  Usar calzados o plantillas especficos dentro del calzado (aparatos ortopdicos) para sentir comodidad y 17.  Usar frulas en los pies mientras duerme. Las frulas [employer] pies en una posicin (generalmente a 6 grados) que debe prevenir y Best boy que siente al levantarse de la cama por primera vez. Adems, facilitan el estiramiento en la maana.  Tomar medicamentos de venta libre para Best boy, como antiinflamatorios no esteroideos ([place]).  Utilizar ondas sonoras de alta intensidad para fragmentar el espoln calcneo (terapia extracorprea por ondas de choque).  Recibir inyecciones de corticoesteroides en el taln para reducir la inflamacin.  Someterse a Civil engineer, contracting calcneo causa dolor a Barrister's clerk (crnico). Siga estas indicaciones en su casa:  [employer]  actividades que le causen dolor hasta que se recupere o durante el tiempo que le haya indicado el mdico.  Realice ejercicios de estiramiento como se le indic. Elongue antes de hacer ejercicio o actividad fsica. Control del dolor, la rigidez y la hinchazn  Si se lo indican, aplquese hielo  sobre el pie: ? Ponga el hielo en una bolsa plstica. ? Coloque una [employer] piel y la bolsa de hielo. ? Coloque el hielo durante 73minutos, de 2 a 3veces por da.  Mueva los dedos de los pies con frecuencia para evitar la rigidez y para reducir la hinchazn.  Cuando sea posible, levante (eleve) el pie por encima del nivel del corazn mientras est sentado o acostado. Instrucciones generales  [employer] de venta libre y los recetados solamente como se lo haya indicado el mdico.  Use zapatos con buen apoyo y buen calce. Use las frulas, plantillas o aparatos ortopdicos como se lo haya indicado el mdico.  Si se lo recomiendan, consulte a su mdico para bajar de peso. Esto puede aliviar la presin en el pie.  No consuma ningn producto que contenga nicotina o tabaco, como cigarrillos y Psychologist, sport and exercise. Estos productos pueden afectar el crecimiento seo y Sales promotion account executive. Si necesita ayuda para dejar de fumar, consulte al mdico.  Concurra a todas las visitas de control como se lo haya indicado el mdico. Esto es importante. Comunquese con un mdico si:  El dolor no desaparece con Dispensing optician.  El dolor [place]. Resumen  Los espolones calcneos son protuberancias seas que se forman en la parte inferior del hueso del taln (calcneo).  Los espolones calcneos suelen causar inflamacin en la banda de tejido que [employer] dedos del pie con el hueso del taln (fascia plantar). Esto puede causar dolor en la zona inferior del pie, cerca del taln.  Hacer ejercicios de elongacin, bajar de peso, usar zapatos o Land, usar frulas [employer] duerme y tomar analgsicos puede Best boy y la rigidez.  Otras opciones de tratamiento pueden incluir ondas sonoras de alta intensidad para fragmentar el espoln calcneo, inyecciones con corticoesteroides y [place]. Esta informacin no tiene Marine scientist el consejo del mdico. Asegrese  de hacerle al mdico cualquier pregunta que tenga. Document Released: 03/18/2006 Document Revised: 07/20/2017 Document Reviewed: 07/20/2017 Elsevier Patient Education  2020 [employer].

## 2019-03-13 ENCOUNTER — Other Ambulatory Visit: Payer: Self-pay | Admitting: Podiatry

## 2019-03-13 ENCOUNTER — Other Ambulatory Visit: Payer: Self-pay

## 2019-03-13 ENCOUNTER — Ambulatory Visit (INDEPENDENT_AMBULATORY_CARE_PROVIDER_SITE_OTHER): Payer: Commercial Managed Care - PPO

## 2019-03-13 ENCOUNTER — Ambulatory Visit (INDEPENDENT_AMBULATORY_CARE_PROVIDER_SITE_OTHER): Payer: Commercial Managed Care - PPO | Admitting: Podiatry

## 2019-03-13 DIAGNOSIS — M79671 Pain in right foot: Secondary | ICD-10-CM

## 2019-03-13 DIAGNOSIS — M79672 Pain in left foot: Secondary | ICD-10-CM | POA: Diagnosis not present

## 2019-03-13 DIAGNOSIS — M722 Plantar fascial fibromatosis: Secondary | ICD-10-CM

## 2019-03-13 MED ORDER — MELOXICAM 15 MG PO TABS: 15.0000 mg | ORAL_TABLET | Freq: Every day | ORAL | 1 refills | Status: DC

## 2019-03-14 NOTE — Progress Notes (Signed)
   Subjective: 48 y.o. female presenting today as a new patient with a chief complaint of bilateral heel pain that began about 6 months ago. Being on her feet for long periods of time increases the pain. She has not had any treatment for her symptoms but has been using OTC insoles in her shoes. Patient is here for further evaluation and treatment.   Past Medical History:  Diagnosis Date  . Depression   . Prediabetes   . Vitamin D deficiency      Objective: Physical Exam General: The patient is alert and oriented x3 in no acute distress.  Dermatology: Skin is warm, dry and supple bilateral lower extremities. Negative for open lesions or macerations bilateral.   Vascular: Dorsalis Pedis and Posterior Tibial pulses palpable bilateral.  Capillary fill time is immediate to all digits.  Neurological: Epicritic and protective threshold intact bilateral.   Musculoskeletal: Tenderness to palpation to the plantar aspect of the bilateral heels along the plantar fascia. All other joints range of motion within normal limits bilateral. Strength 5/5 in all groups bilateral.   Radiographic exam: Normal osseous mineralization. Joint spaces preserved. No fracture/dislocation/boney destruction. No other soft tissue abnormalities or radiopaque foreign bodies.   Assessment: 1. plantar fasciitis bilateral feet  Plan of Care:  1. Patient evaluated. Xrays reviewed.   2. Injection of 0.5cc Celestone soluspan injected into the bilateral heels.  3. Continue using OTC insoles and good shoe gear.  4. Rx for Meloxicam ordered for patient. 5. Plantar fascial band(s) dispensed for bilateral plantar fasciitis. 6. Instructed patient regarding therapies and modalities at home to alleviate symptoms.  7. Return to clinic in 4 weeks.    Edrick Kins, DPM [facility]  Dr. Edrick Kins, DPM    [address], [place] [address]                Office  [phone]  Fax [phone]

## 2019-03-30 ENCOUNTER — Other Ambulatory Visit: Payer: Self-pay

## 2019-03-30 ENCOUNTER — Ambulatory Visit (INDEPENDENT_AMBULATORY_CARE_PROVIDER_SITE_OTHER): Payer: Commercial Managed Care - PPO | Admitting: Family Medicine

## 2019-03-30 ENCOUNTER — Encounter: Payer: Self-pay | Admitting: Family Medicine

## 2019-03-30 VITALS — BP 98/61 | HR 68 | Temp 98.5°F | Ht 60.0 in | Wt 113.4 lb

## 2019-03-30 DIAGNOSIS — R1904 Left lower quadrant abdominal swelling, mass and lump: Secondary | ICD-10-CM | POA: Diagnosis not present

## 2019-03-30 DIAGNOSIS — K219 Gastro-esophageal reflux disease without esophagitis: Secondary | ICD-10-CM

## 2019-03-30 MED ORDER — PANTOPRAZOLE SODIUM 40 MG PO TBEC: 40.0000 mg | DELAYED_RELEASE_TABLET | Freq: Every day | ORAL | 3 refills | Status: DC

## 2019-03-30 NOTE — Patient Instructions (Signed)
° ° ° °  If you have lab work done today you will be contacted with your lab results within the next 2 weeks.  If you have not heard from us then please contact us. The fastest way to get your results is to register for My Chart. ° ° °IF you received an x-ray today, you will receive an invoice from [facility]. Please contact  Radiology at [phone] with questions or concerns regarding your invoice.  ° °IF you received labwork today, you will receive an invoice from [facility]. Please contact [facility] at [phone] with questions or concerns regarding your invoice.  ° °Our billing staff will not be able to assist you with questions regarding bills from these companies. ° °You will be contacted with the lab results as soon as they are available. The fastest way to get your results is to activate your My Chart account. Instructions are located on the last page of this paperwork. If you have not heard from us regarding the results in 2 weeks, please contact this office. °  ° ° ° °

## 2019-03-30 NOTE — Progress Notes (Signed)
10/15/20205:10 PM  Samantha Robertson [birth year], 48 y.o., female [id number]  Chief Complaint  Patient presents with  . GI Problem    says when she eats, her stomach burns, feels bloated and heavy for the past 2 wks. No constipation    HPI:   Patient is a 48 y.o. female  who presents today for burning abd pain with LLQ bulge  Patient reports 3 weeks of constant abd burning sensation, epigastric, bloating, indigestion, belching Denies any nausea or vomiting Denies any trigger foods Reports good appettite Denies any diarrhea, constipation, melena, blood in stool Not taking omeprazole - feels it did not help She is taking meloxicam daily Labs in aug 2020 incl h pylori neg  Feels soreness bugling at LLQ with lifting heavy objects  Depression screen [facility] 2/9 03/30/2019 02/23/2019 01/26/2019  Decreased Interest 0 0 0  Down, Depressed, Hopeless 0 0 0  PHQ - 2 Score 0 0 0  Altered sleeping - - -  Tired, decreased energy - - -  Change in appetite - - -  Feeling bad or failure about yourself  - - -  Trouble concentrating - - -  Moving slowly or fidgety/restless - - -  Suicidal thoughts - - -  PHQ-9 Score - - -  Difficult doing work/chores - - -    Fall Risk  03/30/2019 02/23/2019 01/26/2019 08/22/2018 05/19/2018  Falls in the past year? 0 0 0 0 0  Number falls in past yr: 0 0 0 0 -  Injury with Fall? 0 0 0 0 -     No Known Allergies  Prior to Admission medications   Medication Sig Start Date End Date Taking? Authorizing Provider  meloxicam (MOBIC) 15 MG tablet Take 1 tablet (15 mg total) by mouth daily. 03/13/19  Yes Edrick Kins, DPM  omeprazole (PRILOSEC) 20 MG capsule Take 1 capsule (20 mg total) by mouth daily. 01/26/19  Yes Rutherford Guys, MD  Vitamin D, Ergocalciferol, (DRISDOL) 1.25 MG (50000 UT) CAPS capsule Take 1 capsule (50,000 Units total) by mouth every 7 (seven) days. 02/09/19  Yes Rutherford Guys, MD    Past Medical History:  Diagnosis Date  . Depression    . Prediabetes   . Vitamin D deficiency     Past Surgical History:  Procedure Laterality Date  . CESAREAN SECTION  06/29/06    REPEAT C/S W BTSP  . TUBAL LIGATION  06-29-06   AT TIME OF REPEAT C/S    Social History   Tobacco Use  . Smoking status: Never Smoker  . Smokeless tobacco: Never Used  Substance Use Topics  . Alcohol use: No    Alcohol/week: 0.0 standard drinks    Family History  Problem Relation Age of Onset  . Other Maternal Aunt        Cancer in blood  . Kidney disease Father   . Kidney disease Sister   . Colon cancer Neg Hx   . Colon polyps Neg Hx   . Diabetes Neg Hx   . Esophageal cancer Neg Hx   . Gallbladder disease Neg Hx   . Heart disease Neg Hx   . Breast cancer Neg Hx     ROS Per hpi  OBJECTIVE:  Today's Vitals   03/30/19 1648  BP: 98/61  Pulse: 68  Temp: 98.5 F (36.9 C)  SpO2: 98%  Weight: 113 lb 6.4 oz (51.4 kg)  Height: 5' (1.524 m)   Body mass index is 22.15 kg/m.  Physical Exam Vitals signs and nursing note reviewed.  Constitutional:      Appearance: She is well-developed.  HENT:     Head: Normocephalic and atraumatic.     Mouth/Throat:     Pharynx: No oropharyngeal exudate.  Eyes:     General: No scleral icterus.    Conjunctiva/sclera: Conjunctivae normal.     Pupils: Pupils are equal, round, and reactive to light.  Neck:     Musculoskeletal: Neck supple.  Cardiovascular:     Rate and Rhythm: Normal rate and regular rhythm.     Heart sounds: Normal heart sounds. No murmur. No friction rub. No gallop.   Pulmonary:     Effort: Pulmonary effort is normal.     Breath sounds: Normal breath sounds. No wheezing or rales.  Abdominal:     General: Bowel sounds are normal. There is no distension.     Palpations: Abdomen is soft. There is no hepatomegaly or splenomegaly.     Tenderness: There is abdominal tenderness in the epigastric area and left lower quadrant. There is no guarding or rebound.     Hernia: A hernia (left  spigelian) is present.  Skin:    General: Skin is warm and dry.  Neurological:     Mental Status: She is alert and oriented to person, place, and time.       No results found for this or any previous visit (from the past 24 hour(s)).  No results found.   ASSESSMENT and PLAN  1. Gastroesophageal reflux disease without esophagitis Stop meloxicam, trial of pantoprazole 40mg . Cont with LFM.  2. LLQ abdominal mass Exam suggestive of left spigelian hernia, [place] to confirm. Discussed RTC precautions. Consider gen surg referral if [place] positive. - US Abdomen Limited; Future  Other orders - pantoprazole (PROTONIX) 40 MG tablet; Take 1 tablet (40 mg total) by mouth daily.  Return in about 4 weeks (around 04/27/2019).    Rutherford Guys, MD Primary Care at [place], [place] [address] Ph.  [phone] Fax [phone]

## 2019-04-05 ENCOUNTER — Other Ambulatory Visit: Payer: Commercial Managed Care - PPO

## 2019-04-10 ENCOUNTER — Ambulatory Visit (INDEPENDENT_AMBULATORY_CARE_PROVIDER_SITE_OTHER): Payer: Commercial Managed Care - PPO | Admitting: Podiatry

## 2019-04-10 ENCOUNTER — Other Ambulatory Visit: Payer: Self-pay

## 2019-04-10 DIAGNOSIS — M722 Plantar fascial fibromatosis: Secondary | ICD-10-CM

## 2019-04-10 NOTE — Patient Instructions (Signed)
Powerstep insoles on [email or web address]

## 2019-04-12 ENCOUNTER — Other Ambulatory Visit: Payer: Self-pay | Admitting: Family Medicine

## 2019-04-12 ENCOUNTER — Ambulatory Visit
Admission: RE | Admit: 2019-04-12 | Discharge: 2019-04-12 | Disposition: A | Discharge status: Home / Self Care | Payer: Commercial Managed Care - PPO | Source: Ambulatory Visit | Attending: Family Medicine | Admitting: Family Medicine

## 2019-04-12 DIAGNOSIS — R1904 Left lower quadrant abdominal swelling, mass and lump: Secondary | ICD-10-CM

## 2019-04-13 NOTE — Progress Notes (Signed)
   Subjective: 48 y.o. female presenting today for follow up evaluation of plantar fasciitis of the bilateral feet. He reports intermittent pain of the right foot and states the left foot pain is more constant and severe. He has been taking Meloxicam as directed. Being on the feet for long periods of time increases the pain. Patient is here for further evaluation and treatment.   Past Medical History:  Diagnosis Date  . Depression   . Prediabetes   . Vitamin D deficiency      Objective: Physical Exam General: The patient is alert and oriented x3 in no acute distress.  Dermatology: Skin is warm, dry and supple bilateral lower extremities. Negative for open lesions or macerations bilateral.   Vascular: Dorsalis Pedis and Posterior Tibial pulses palpable bilateral.  Capillary fill time is immediate to all digits.  Neurological: Epicritic and protective threshold intact bilateral.   Musculoskeletal: Tenderness to palpation to the plantar aspect of the bilateral heels along the plantar fascia. All other joints range of motion within normal limits bilateral. Strength 5/5 in all groups bilateral.   Assessment: 1. plantar fasciitis bilateral feet  Plan of Care:  1. Patient evaluated.    2. Injection of 0.5cc Celestone soluspan injected into the bilateral heels.  3. Continue taking Meloxicam.  4. Recommended OTC Powerstep Insoles from [email or web address] 5. Return to clinic in 4 weeks.   Works assembly line pushing a foot pedal.    Edrick Kins, DPM [facility]  Dr. Edrick Kins, DPM    [address], [place] [address]                Office [phone]  Fax [phone]

## 2019-04-17 ENCOUNTER — Other Ambulatory Visit: Payer: Commercial Managed Care - PPO

## 2019-04-28 ENCOUNTER — Encounter: Payer: Self-pay | Admitting: Family Medicine

## 2019-04-28 ENCOUNTER — Other Ambulatory Visit: Payer: Self-pay

## 2019-04-28 ENCOUNTER — Ambulatory Visit (INDEPENDENT_AMBULATORY_CARE_PROVIDER_SITE_OTHER): Payer: Commercial Managed Care - PPO | Admitting: Family Medicine

## 2019-04-28 VITALS — BP 109/69 | HR 94 | Temp 98.8°F | Ht 60.0 in | Wt 114.2 lb

## 2019-04-28 DIAGNOSIS — F411 Generalized anxiety disorder: Secondary | ICD-10-CM

## 2019-04-28 DIAGNOSIS — K219 Gastro-esophageal reflux disease without esophagitis: Secondary | ICD-10-CM | POA: Diagnosis not present

## 2019-04-28 DIAGNOSIS — R1013 Epigastric pain: Secondary | ICD-10-CM | POA: Diagnosis not present

## 2019-04-28 MED ORDER — ESCITALOPRAM OXALATE 20 MG PO TABS: 20.0000 mg | ORAL_TABLET | Freq: Every day | ORAL | 2 refills | Status: DC

## 2019-04-28 NOTE — Progress Notes (Signed)
11/13/20205:26 PM  Samantha Robertson 07/13/1970, 48 y.o., female [id number]  Chief Complaint  Patient presents with  . Follow-up    stomach is still not good, still taking the meds say it is only working a little    HPI:   Patient is a 48 y.o. female who presents today for followup  Seen a month ago Started pantoprazole for GERD - not helping, cont to have epigastiric burning abd pain when she eats, does not radiate, some bloating and burping, no black tarry stools, no nausea or vomiting Has stopped eating greasy, spicy and acidic foods - wo improvement in pain Possible spigelian hernia, [place] negative  Requesting medication for anxiety Feeling irritable, anxious, not sleeping well Denies any sign depression Has never taken medication  Depression screen [facility] 2/9 04/28/2019 03/30/2019 02/23/2019  Decreased Interest 0 0 0  Down, Depressed, Hopeless 0 0 0  PHQ - 2 Score 0 0 0  Altered sleeping - - -  Tired, decreased energy - - -  Change in appetite - - -  Feeling bad or failure about yourself  - - -  Trouble concentrating - - -  Moving slowly or fidgety/restless - - -  Suicidal thoughts - - -  PHQ-9 Score - - -  Difficult doing work/chores - - -    Fall Risk  04/28/2019 03/30/2019 02/23/2019 01/26/2019 08/22/2018  Falls in the past year? 0 0 0 0 0  Number falls in past yr: 0 0 0 0 0  Injury with Fall? 0 0 0 0 0     No Known Allergies  Prior to Admission medications   Medication Sig Start Date End Date Taking? Authorizing Provider  pantoprazole (PROTONIX) 40 MG tablet Take 1 tablet (40 mg total) by mouth daily. 03/30/19  Yes Rutherford Guys, MD  Vitamin D, Ergocalciferol, (DRISDOL) 1.25 MG (50000 UT) CAPS capsule Take 1 capsule (50,000 Units total) by mouth every 7 (seven) days. 02/09/19  Yes Rutherford Guys, MD    Past Medical History:  Diagnosis Date  . Depression   . Prediabetes   . Vitamin D deficiency     Past Surgical History:  Procedure Laterality Date   . CESAREAN SECTION  06/29/06    REPEAT C/S W BTSP  . TUBAL LIGATION  06-29-06   AT TIME OF REPEAT C/S    Social History   Tobacco Use  . Smoking status: Never Smoker  . Smokeless tobacco: Never Used  Substance Use Topics  . Alcohol use: No    Alcohol/week: 0.0 standard drinks    Family History  Problem Relation Age of Onset  . Other Maternal Aunt        Cancer in blood  . Kidney disease Father   . Kidney disease Sister   . Colon cancer Neg Hx   . Colon polyps Neg Hx   . Diabetes Neg Hx   . Esophageal cancer Neg Hx   . Gallbladder disease Neg Hx   . Heart disease Neg Hx   . Breast cancer Neg Hx     ROS Per hpi  OBJECTIVE:  Today's Vitals   04/28/19 1711  BP: 109/69  Pulse: 94  Temp: 98.8 F (37.1 C)  SpO2: 96%  Weight: 114 lb 3.2 oz (51.8 kg)  Height: 5' (1.524 m)   Body mass index is 22.3 kg/m.   Physical Exam Vitals signs and nursing note reviewed.  Constitutional:      Appearance: She is well-developed.  HENT:  Head: Normocephalic and atraumatic.  Eyes:     General: No scleral icterus.    Conjunctiva/sclera: Conjunctivae normal.     Pupils: Pupils are equal, round, and reactive to light.  Neck:     Musculoskeletal: Neck supple.  Pulmonary:     Effort: Pulmonary effort is normal.  Skin:    General: Skin is warm and dry.  Neurological:     Mental Status: She is alert and oriented to person, place, and time.     No results found for this or any previous visit (from the past 24 hour(s)).  No results found.   ASSESSMENT and PLAN  1. Gastroesophageal reflux disease without esophagitis 2. EPIGASTRIC PAIN Not controlled. Cont with PPI and LFM, referring to GI for further eval and treatment - Ambulatory referral to Gastroenterology  3. GAD (generalized anxiety disorder) New diagnosis. Reviewed treatment options, starting lexapro, reviewed r/se/b, reviewed titration. Other orders - escitalopram (LEXAPRO) 20 MG tablet; Take 1 tablet (20  mg total) by mouth at bedtime.  Return in about 6 weeks (around 06/09/2019).    Rutherford Guys, MD Primary Care at [place], [place] [address] Ph.  [phone] Fax [phone]

## 2019-04-28 NOTE — Patient Instructions (Addendum)
empezar tomando 1/2 pastilla de lexapro cada noche, si en 2-3 semanas la ansiedad no ha mejorado lo suficiente, aumentar a una pastilla diaria    If you have lab work done today you will be contacted with your lab results within the next 2 weeks.  If you have not heard from [place] then please contact us. The fastest way to get your results is to register for My Chart.   IF you received an x-ray today, you will receive an invoice from [facility]. Please contact [facility] at [phone] with questions or concerns regarding your invoice.   IF you received labwork today, you will receive an invoice from [facility]. Please contact [facility] at [phone] with questions or concerns regarding your invoice.   Our billing staff will not be able to assist you with questions regarding bills from these companies.  You will be contacted with the lab results as soon as they are available. The fastest way to get your results is to activate your My Chart account. Instructions are located on the last page of this paperwork. If you have not heard from [place] regarding the results in 2 weeks, please contact this office.        If you have lab work done today you will be contacted with your lab results within the next 2 weeks.  If you have not heard from [place] then please contact us. The fastest way to get your results is to register for My Chart.   IF you received an x-ray today, you will receive an invoice from [facility]. Please contact [facility] at [phone] with questions or concerns regarding your invoice.   IF you received labwork today, you will receive an invoice from [facility]. Please contact [facility] at [phone] with questions or concerns regarding your invoice.   Our billing staff will not be able to assist you with questions regarding bills from these companies.  You will be contacted with the lab results as soon as they are available. The fastest way to get  your results is to activate your My Chart account. Instructions are located on the last page of this paperwork. If you have not heard from [place] regarding the results in 2 weeks, please contact this office.

## 2019-05-08 ENCOUNTER — Encounter: Payer: Self-pay | Admitting: Gastroenterology

## 2019-05-10 ENCOUNTER — Ambulatory Visit: Payer: Commercial Managed Care - PPO | Admitting: Podiatry

## 2019-05-15 ENCOUNTER — Other Ambulatory Visit: Payer: Self-pay

## 2019-05-15 ENCOUNTER — Ambulatory Visit (INDEPENDENT_AMBULATORY_CARE_PROVIDER_SITE_OTHER): Payer: Commercial Managed Care - PPO | Admitting: Podiatry

## 2019-05-15 DIAGNOSIS — M722 Plantar fascial fibromatosis: Secondary | ICD-10-CM | POA: Diagnosis not present

## 2019-05-18 NOTE — Progress Notes (Signed)
   Subjective: 48 y.o. female presenting today for follow up evaluation of plantar fasciitis of the bilateral feet. She states the injections and using the plantar fascial braces have helped alleviate the pain temporarily but has not resolved it. There are no worsening factors noted at this time. Patient is here for further evaluation and treatment.   Past Medical History:  Diagnosis Date  . Depression   . Prediabetes   . Vitamin D deficiency      Objective: Physical Exam General: The patient is alert and oriented x3 in no acute distress.  Dermatology: Skin is warm, dry and supple bilateral lower extremities. Negative for open lesions or macerations bilateral.   Vascular: Dorsalis Pedis and Posterior Tibial pulses palpable bilateral.  Capillary fill time is immediate to all digits.  Neurological: Epicritic and protective threshold intact bilateral.   Musculoskeletal: Tenderness to palpation to the plantar aspect of the bilateral heels along the plantar fascia. All other joints range of motion within normal limits bilateral. Strength 5/5 in all groups bilateral.   Assessment: 1. plantar fasciitis bilateral feet  Plan of Care:  1. Patient evaluated.    2. Injection of 0.5cc Celestone soluspan injected into the bilateral heels.  3. Continue taking Meloxicam.  4. Continue using OTC insoles.  5. Continue using plantar fascial braces.  6. Return to clinic in 4 weeks. If not better, we will discuss surgery.    Works assembly line pushing a foot pedal.    Edrick Kins, DPM [facility]  Dr. Edrick Kins, DPM    [address], [place] [address]                Office [phone]  Fax [phone]

## 2019-05-30 ENCOUNTER — Other Ambulatory Visit: Payer: Self-pay | Admitting: Podiatry

## 2019-05-30 NOTE — Telephone Encounter (Signed)
Patient's chart states medication refill requested has been sent 2 weeks ago by a different provider

## 2019-06-06 ENCOUNTER — Ambulatory Visit: Payer: Commercial Managed Care - PPO | Admitting: Family Medicine

## 2019-06-19 ENCOUNTER — Encounter: Payer: Self-pay | Admitting: Gastroenterology

## 2019-06-19 ENCOUNTER — Ambulatory Visit: Payer: Commercial Managed Care - PPO | Admitting: Podiatry

## 2019-06-19 ENCOUNTER — Ambulatory Visit (INDEPENDENT_AMBULATORY_CARE_PROVIDER_SITE_OTHER): Payer: Commercial Managed Care - PPO | Admitting: Gastroenterology

## 2019-06-19 ENCOUNTER — Other Ambulatory Visit (INDEPENDENT_AMBULATORY_CARE_PROVIDER_SITE_OTHER): Payer: Commercial Managed Care - PPO

## 2019-06-19 VITALS — BP 112/60 | HR 72 | Temp 97.9°F | Ht 59.5 in | Wt 116.2 lb

## 2019-06-19 DIAGNOSIS — R1013 Epigastric pain: Secondary | ICD-10-CM

## 2019-06-19 DIAGNOSIS — Z79899 Other long term (current) drug therapy: Secondary | ICD-10-CM

## 2019-06-19 DIAGNOSIS — R109 Unspecified abdominal pain: Secondary | ICD-10-CM

## 2019-06-19 DIAGNOSIS — Z8719 Personal history of other diseases of the digestive system: Secondary | ICD-10-CM

## 2019-06-19 LAB — IGA: IgA: 161 mg/dL (ref 68–378)

## 2019-06-19 MED ORDER — PANTOPRAZOLE SODIUM 40 MG PO TBEC: 40.0000 mg | DELAYED_RELEASE_TABLET | Freq: Two times a day (BID) | ORAL | 1 refills | Status: DC

## 2019-06-19 MED ORDER — SUCRALFATE 1 G PO TABS: 1.0000 g | ORAL_TABLET | Freq: Four times a day (QID) | ORAL | 1 refills | Status: DC | PRN

## 2019-06-19 NOTE — Patient Instructions (Addendum)
If you are age 49 or older, your body mass index should be between 23-30. Your Body mass index is 23.09 kg/m. If this is out of the aforementioned range listed, please consider follow up with your Primary Care Provider.  If you are age 72 or younger, your body mass index should be between 19-25. Your Body mass index is 23.09 kg/m. If this is out of the aformentioned range listed, please consider follow up with your Primary Care Provider.    Please go to the lab in the basement of our building to have lab work done as you leave today. Hit "B" for basement when you get on the elevator.  When the doors open the lab is on your left.  We will call you with the results. Thank you.   Please stop taking Meloxicam.  We have sent the following medications to your pharmacy for you to pick up at your convenience:  INCREASE: Protonix 40mg  to twice daily START: Carafate 1 gram tablet every 6 hours as needed.  Please contact our office in 2 weeks if your symptoms have not resolved.   Thank you for entrusting me with your care and for choosing [facility], Dr. [place] Cellar

## 2019-06-19 NOTE — Progress Notes (Signed)
HPI :  49 year old female with a history of depression, joint pain, vitamin D deficiency, referred here by Grant Fontana MD for abdominal discomfort.  She is a new patient to me, she was last seen in our office in June 2016 for abdominal pain.  She was found to have a superficial duodenal ulcer at that time.  Biopsies showed focal increase in intraepithelial lymphocytes, otherwise no testing for H. pylori was performed at the time and she was placed on a PPI.  History provided through a translator today.  She has 2 different pains that she is experiencing  The first pain is in her left upper quadrant to left mid abdomen.  This is been going on for the past 3 to 4 months.  She will feel discomfort when she is lifting something heavy.  If she is not lifting something she has no pain in this area and it does not bother her.  She did denies any triggers or abdominal trauma which stimulated this.  She denies any muscle strains lifting something heavy when this occurred.  This happened suddenly and has persisted over time.  The other issue which is more of a chronic issue is epigastric discomfort.  She experiences this when she eats something heavy with greasy or spicy contents, she feels a burning sensation in her epigastric area.  She states this got worse when she had COVID-19 about 5 months ago but has not resolved.  In looking through her chart she has felt like this in the past which is led to prior endoscopies.  She denies any right upper quadrant pain or radiation to her back or shoulder.  She can sometimes feel this after she eats, occasionally will have this if she is not eating.  She has some occasional nausea but no vomiting.  She does have some pyrosis that bothers her at times as well.  No dysphagia.  She is moving her bowels well, no blood in the stools.  No diarrhea.  She had been given a dose of Protonix 40 mg a day, unclear how long she was on this it sounds fairly recent, she thinks it is  helping somewhat.  She has also been on Mobic 15 mg a day for her leg for the past few months.  She denies other NSAID use other than Mobic.  She takes Tylenol occasionally.  She also has been taking an over-the-counter supplement after her COVID-19 infection, she is on able to tell me what the name of it is but she asks if she should continue it or not.  She had H pylori  Breath test negative 01/26/19   EGD 10/25/2014 - suuperficial duodenal ulcer, otherwise normal exam - focal increase in intraepithelial lymphocytes.  EGD 04/26/2008 - normal exam  GES 08/03/2014 - normal   US abdomen 01/20/2012 - normal exam  Past Medical History:  Diagnosis Date  . Depression   . Duodenal ulcer   . Prediabetes   . Vitamin D deficiency      Past Surgical History:  Procedure Laterality Date  . CESAREAN SECTION  06/29/06    REPEAT C/S W BTSP  . TUBAL LIGATION  06-29-06   AT TIME OF REPEAT C/S   Family History  Problem Relation Age of Onset  . Other Maternal Aunt        Cancer in blood  . Kidney disease Father   . Kidney disease Sister   . Colon cancer Neg Hx   . Colon polyps Neg Hx   .  Diabetes Neg Hx   . Esophageal cancer Neg Hx   . Gallbladder disease Neg Hx   . Heart disease Neg Hx   . Breast cancer Neg Hx    Social History   Tobacco Use  . Smoking status: Never Smoker  . Smokeless tobacco: Never Used  Substance Use Topics  . Alcohol use: No    Alcohol/week: 0.0 standard drinks  . Drug use: No   Current Outpatient Medications  Medication Sig Dispense Refill  . escitalopram (LEXAPRO) 20 MG tablet Take 1 tablet (20 mg total) by mouth at bedtime. 30 tablet 2  . pantoprazole (PROTONIX) 40 MG tablet Take 1 tablet (40 mg total) by mouth 2 (two) times daily. 90 tablet 1  . Vitamin D, Ergocalciferol, (DRISDOL) 1.25 MG (50000 UT) CAPS capsule Take 1 capsule (50,000 Units total) by mouth every 7 (seven) days. 12 capsule 0  . sucralfate (CARAFATE) 1 g tablet Take 1 tablet (1 g total) by  mouth every 6 (six) hours as needed. 60 tablet 1   No current facility-administered medications for this visit.   No Known Allergies   Review of Systems: All systems reviewed and negative except where noted in HPI.    Lab Results  Component Value Date   WBC 5.3 01/26/2019   HGB 13.4 01/26/2019   HCT 36.9 01/26/2019   MCV 91 01/26/2019   PLT 269 01/26/2019    Lab Results  Component Value Date   CREATININE 0.67 01/26/2019   BUN 15 01/26/2019   NA 143 01/26/2019   K 4.4 01/26/2019   CL 106 01/26/2019   CO2 24 01/26/2019    Lab Results  Component Value Date   ALT 12 01/26/2019   AST 20 01/26/2019   ALKPHOS 69 01/26/2019   BILITOT 0.3 01/26/2019     Physical Exam: BP 112/60   Pulse 72   Temp 97.9 F (36.6 C)   Ht 4' 11.5" (1.511 m)   Wt 116 lb 4 oz (52.7 kg)   BMI 23.09 kg/m  Constitutional: Pleasant,well-developed, female in no acute distress. HEENT: Normocephalic and atraumatic. Conjunctivae are normal. No scleral icterus. Neck supple.  Cardiovascular: Normal rate, regular rhythm.  Pulmonary/chest: Effort normal and breath sounds normal. No wheezing, rales or rhonchi. Abdominal: Soft, nondistended, some tenderness in the mid to LUQ with palpation, negative [place]. There are no masses palpable.  Extremities: no edema Lymphadenopathy: No cervical adenopathy noted. Neurological: Alert and oriented to person place and time. Skin: Skin is warm and dry. No rashes noted. Psychiatric: Normal mood and affect. Behavior is normal.   ASSESSMENT AND PLAN: 49 year old female here for new patient assessment of the following:  Dyspepsia / history of duodenal ulcer / NSAID use - history as outlined above.  She is taking Mobic on a daily basis which could be contributing to / causing her symptoms.  She has a history of small duodenal ulcer in the past.  She tested negative for H. pylori this past summer.  I recommend she completely stop the Mobic and hopefully that will  make her feel better.  She can use Tylenol for her joint pains.  I would increase the dose of Protonix to 40 mg twice a day for a few weeks and see if that additionally helps, as well as give her a trial of Carafate tablets every 6 hours as needed.  Given some increase in intraepithelial lymphocytes on prior EGD, I will send labs serologies for celiac to ensure negative although she has no  bowel symptoms at all. If this resolves her symptoms then we will slowly overtime back off the Carafate and Protonix and see how she does off the NSAIDs.  I asked her to contact me in a few weeks and let me know how she is doing.  If her symptoms persist despite escalation of PPI and adding Carafate, we will consider endoscopy or imaging.  She agreed  Left sided abdominal discomfort - I suspect this component of her symptoms may be musculoskeletal as she describes it, however will await her course with the regimen as above.  If this persists over time, again she will contact me and we may consider some imaging.  She agreed  [place] Cellar, MD [facility]  I spent 45-59 minutes of time, including in depth chart review, independent review of results as outlined above, communicating results with the patient directly, face-to-face time with the patient, coordinating care, and ordering studies and medications as appropriate. Greater than 50% of the time was spent counseling and coordinating care.    CC: Rutherford Guys, MD

## 2019-06-20 ENCOUNTER — Telehealth: Payer: Self-pay

## 2019-06-20 LAB — TISSUE TRANSGLUTAMINASE, IGA: (tTG) Ab, IgA: 1 U/mL

## 2019-06-20 NOTE — Telephone Encounter (Signed)
-----   Message from Yetta Flock, MD sent at 06/20/2019 12:44 PM EST ----- Sherlynn Stalls can you relay to the patient her lab test for celiac disease is NEGATIVE which is good news.  We will await the trial of medication as I outlined in my note yesterday, she should call us back in a few weeks to let us know how she is doing. Thanks

## 2019-06-20 NOTE — Telephone Encounter (Signed)
Attempted to call patient again. No answer and mail box full

## 2019-06-20 NOTE — Telephone Encounter (Signed)
Called patient  Via interpreter: Samantha Robertson [phone]. Mail box full. Will try later.

## 2019-06-21 ENCOUNTER — Ambulatory Visit (INDEPENDENT_AMBULATORY_CARE_PROVIDER_SITE_OTHER): Payer: Commercial Managed Care - PPO | Admitting: Podiatry

## 2019-06-21 ENCOUNTER — Other Ambulatory Visit: Payer: Self-pay

## 2019-06-21 DIAGNOSIS — M722 Plantar fascial fibromatosis: Secondary | ICD-10-CM | POA: Diagnosis not present

## 2019-06-22 ENCOUNTER — Encounter: Payer: Self-pay | Admitting: Family Medicine

## 2019-06-22 ENCOUNTER — Other Ambulatory Visit: Payer: Self-pay

## 2019-06-22 ENCOUNTER — Ambulatory Visit (INDEPENDENT_AMBULATORY_CARE_PROVIDER_SITE_OTHER): Payer: Commercial Managed Care - PPO | Admitting: Family Medicine

## 2019-06-22 VITALS — BP 100/60 | HR 60 | Temp 98.0°F | Ht 59.5 in | Wt 114.8 lb

## 2019-06-22 DIAGNOSIS — F411 Generalized anxiety disorder: Secondary | ICD-10-CM | POA: Diagnosis not present

## 2019-06-22 DIAGNOSIS — E559 Vitamin D deficiency, unspecified: Secondary | ICD-10-CM | POA: Diagnosis not present

## 2019-06-22 DIAGNOSIS — K219 Gastro-esophageal reflux disease without esophagitis: Secondary | ICD-10-CM

## 2019-06-22 NOTE — Patient Instructions (Addendum)
Favor de empezar vitamin D (suplemento) 2,000 unidades al dia.   Tylenol XR arthritis 650mg  cada 8 horas si es necesario   Deficiencia de vitaminaD Vitamin D Deficiency La deficiencia de vitaminaD ocurre cuando el organismo no tiene suficiente vitaminaD. La vitaminaD es importante para el organismo por lo siguiente:  Ayuda al organismo a usar otros minerales.  Ayuda a [employer] fuertes y sanos.  Puede ayudar a Actor.  Ayuda al buen funcionamiento del corazn y de otros msculos. La ingesta insuficiente de vitaminaD puede hacer que los huesos se tornen blandos. Tambin puede causar otros problemas de [place]. Cules son las causas? Esta afeccin puede ser causada por lo siguiente:  No comer suficientes alimentos que contengan vitamina D.  No estar al sol lo suficiente.  Tener enfermedades que le dificultan al cuerpo la absorcin de vitamina D.  Someterse a una [place] se extirpa Ardelia Mems parte del estmago o del intestino delgado.  Tener enfermedad renal o enfermedad heptica. Qu incrementa el riesgo? Es ms probable que tenga esta afeccin si usted:  Es una persona de edad avanzada.  No pasa mucho tiempo al [employer].  Vive en un hogar de ancianos.  Ha tenido fracturas de [place].  Tiene huesos dbiles o finos (osteoporosis).  Tiene una enfermedad o una afeccin que modifica la forma en que el organismo absorbe la vitamina D.  Tiene piel oscura.  Toma ciertos medicamentos.  Tiene sobrepeso o es obeso. Cules son los signos o los sntomas?  En los casos leves, tal vez no haya ningn sntoma. Si la afeccin es muy grave, los sntomas pueden incluir lo siguiente: ? Dolor [employer]. ? Dolor muscular. ? Caerse con frecuencia. ? Huesos fracturados por Primary school teacher. Cmo se trata? El tratamiento puede incluir tomar suplementos como se lo haya indicado el mdico. El mdico le indicar la dosis ms conveniente para  usted. Los suplementos pueden incluir:  VitaminaD.  Calcio. Siga estas instrucciones en su casa: Comida y bebida   Coma alimentos que contengan vitamina D tales como: ? Productos lcteos, cereales o jugos con agregado de vitaminaD. Revise las etiquetas. ? Pescados, como el salmn o la trucha. ? Huevos. ? Ostras. ? Hongos. Es posible que los productos que se enumeran ms [place] no constituyan una lista completa de lo que puede comer y Electronics engineer. Comunquese con un nutricionista para conocer ms opciones. Instrucciones generales  [employer] y los suplementos solamente como se lo haya indicado el mdico.  Reciba luz solar natural de forma regular y segura.  No utilice camas solares.  Mantenga un peso saludable. Baje de peso, si es necesario.  Concurra a todas las visitas de seguimiento como se lo haya indicado el mdico. Esto es importante. Cmo se evita?  Para obtener vitaminaD, puede hacer lo siguiente: ? Consuma alimentos que contengan naturalmente vitaminaD. ? Coma o beba productos con agregado de vitamina D, como cereales, jugos y [place]. ? Tome vitaminaD o un multivitamnico que la [employer]pp. ? Expngase al sol. El organismo produce vitaminaD cuando se expone la piel a la luz del sol. El organismo transforma la luz solar en una forma de vitamina que puede [place]. Comunquese con un mdico si:  Los sntomas no desaparecen.  Siente malestar estomacal (nuseas).  Vomita.  Defeca menos con menos frecuencia de lo habitual o tiene dificultad para defecar (estreimiento). Resumen  La deficiencia de vitaminaD ocurre cuando el organismo no tiene suficiente vitaminaD.  La vitamina D ayuda  a [employer] fuertes y sanos.  A menudo, esta afeccin se trata tomando un suplemento.  El Buyer, retail la dosis ms conveniente para usted. Esta informacin no tiene Marine scientist el consejo del mdico. Asegrese de hacerle al mdico cualquier  pregunta que tenga. Document Revised: 02/24/2018 Document Reviewed: 02/24/2018 Elsevier Patient Education  [place] [employer].    If you have lab work done today you will be contacted with your lab results within the next 2 weeks.  If you have not heard from [place] then please contact us. The fastest way to get your results is to register for My Chart.   IF you received an x-ray today, you will receive an invoice from [facility]. Please contact [facility] at [phone] with questions or concerns regarding your invoice.   IF you received labwork today, you will receive an invoice from [facility]. Please contact [facility] at [phone] with questions or concerns regarding your invoice.   Our billing staff will not be able to assist you with questions regarding bills from these companies.  You will be contacted with the lab results as soon as they are available. The fastest way to get your results is to activate your My Chart account. Instructions are located on the last page of this paperwork. If you have not heard from [place] regarding the results in 2 weeks, please contact this office.

## 2019-06-22 NOTE — Progress Notes (Signed)
1/7/20215:30 PM  Samantha Robertson [birth year], 49 y.o., female [id number]  Chief Complaint  Patient presents with  . Follow-up    follow up from gastro appt    HPI:   Patient is a 49 y.o. female with past medical history significant for GERD, GAD, vitamin D deficiency, prediabetes who presents today for routine followup  Last OV Nov 2020 Started on lexapro - stopped taking  Anxiety is doing beter  Referred to GI - saw them on the 4th Stop mobic, try APAP, increased pantoprazole to 40mg  BID, started carafate, testing for celiac - negative  mobic was started by podiatry however might need surgery  Has completed high dose prescription for vitamin D She has not started OTC supplement  Depression screen [facility] 2/9 06/22/2019 04/28/2019 03/30/2019  Decreased Interest 0 0 0  Down, Depressed, Hopeless 0 0 0  PHQ - 2 Score 0 0 0  Altered sleeping - - -  Tired, decreased energy - - -  Change in appetite - - -  Feeling bad or failure about yourself  - - -  Trouble concentrating - - -  Moving slowly or fidgety/restless - - -  Suicidal thoughts - - -  PHQ-9 Score - - -  Difficult doing work/chores - - -    Fall Risk  06/22/2019 04/28/2019 03/30/2019 02/23/2019 01/26/2019  Falls in the past year? 0 0 0 0 0  Number falls in past yr: 0 0 0 0 0  Injury with Fall? 0 0 0 0 0     No Known Allergies  Prior to Admission medications   Medication Sig Start Date End Date Taking? Authorizing Provider  escitalopram (LEXAPRO) 20 MG tablet Take 1 tablet (20 mg total) by mouth at bedtime. 04/28/19  Yes Rutherford Guys, MD  pantoprazole (PROTONIX) 40 MG tablet Take 1 tablet (40 mg total) by mouth 2 (two) times daily. 06/19/19  Yes Armbruster, Carlota Raspberry, MD  sucralfate (CARAFATE) 1 g tablet Take 1 tablet (1 g total) by mouth every 6 (six) hours as needed. 06/19/19  Yes Armbruster, Carlota Raspberry, MD  Vitamin D, Ergocalciferol, (DRISDOL) 1.25 MG (50000 UT) CAPS capsule Take 1 capsule (50,000 Units total) by  mouth every 7 (seven) days. 02/09/19  Yes Rutherford Guys, MD    Past Medical History:  Diagnosis Date  . Depression   . Duodenal ulcer   . Prediabetes   . Vitamin D deficiency     Past Surgical History:  Procedure Laterality Date  . CESAREAN SECTION  06/29/06    REPEAT C/S W BTSP  . TUBAL LIGATION  06-29-06   AT TIME OF REPEAT C/S    Social History   Tobacco Use  . Smoking status: Never Smoker  . Smokeless tobacco: Never Used  Substance Use Topics  . Alcohol use: No    Alcohol/week: 0.0 standard drinks    Family History  Problem Relation Age of Onset  . Other Maternal Aunt        Cancer in blood  . Kidney disease Father   . Kidney disease Sister   . Colon cancer Neg Hx   . Colon polyps Neg Hx   . Diabetes Neg Hx   . Esophageal cancer Neg Hx   . Gallbladder disease Neg Hx   . Heart disease Neg Hx   . Breast cancer Neg Hx     ROS Per hpi  OBJECTIVE:  Today's Vitals   06/22/19 1710  BP: 100/60  Pulse: 60  Temp:  98 F (36.7 C)  SpO2: 100%  Weight: 114 lb 12.8 oz (52.1 kg)  Height: 4' 11.5" (1.511 m)   Body mass index is 22.8 kg/m.   Physical Exam Vitals and nursing note reviewed.  Constitutional:      Appearance: She is well-developed.  HENT:     Head: Normocephalic and atraumatic.  Eyes:     General: No scleral icterus.    Conjunctiva/sclera: Conjunctivae normal.     Pupils: Pupils are equal, round, and reactive to light.  Pulmonary:     Effort: Pulmonary effort is normal.  Musculoskeletal:     Cervical back: Neck supple.  Skin:    General: Skin is warm and dry.  Neurological:     Mental Status: She is alert and oriented to person, place, and time.     No results found for this or any previous visit (from the past 24 hour(s)).  No results found.   ASSESSMENT and PLAN  1. Vitamin D insufficiency Start OTC D3 supplement. Recheck at next OV.  2. Gastroesophageal reflux disease without esophagitis Under GI care  3. GAD  (generalized anxiety disorder) Doing well off meds.   Return in about 6 months (around 12/20/2019).    Rutherford Guys, MD [facility] at [place], [place] [address] Ph.  [phone] Fax [phone]

## 2019-06-26 NOTE — Progress Notes (Signed)
   Subjective: 49 y.o. female presenting today for follow up evaluation of plantar fasciitis of the bilateral feet. She states the pain has improved but the left foot still hurts more than the right. She wears the plantar fascial brace daily and takes Meloxicam as directed. She uses the OTC insoles while she is working. She denies any worsening factors at this time. Patient is here for further evaluation and treatment.   Past Medical History:  Diagnosis Date  . Depression   . Duodenal ulcer   . Prediabetes   . Vitamin D deficiency      Objective: Physical Exam General: The patient is alert and oriented x3 in no acute distress.  Dermatology: Skin is warm, dry and supple bilateral lower extremities. Negative for open lesions or macerations bilateral.   Vascular: Dorsalis Pedis and Posterior Tibial pulses palpable bilateral.  Capillary fill time is immediate to all digits.  Neurological: Epicritic and protective threshold intact bilateral.   Musculoskeletal: Tenderness to palpation to the plantar aspect of the bilateral heels along the plantar fascia. All other joints range of motion within normal limits bilateral. Strength 5/5 in all groups bilateral.   Assessment: 1. plantar fasciitis bilateral feet  Plan of Care:  1. Patient evaluated.    2. Injection of 0.5cc Celestone soluspan injected into the bilateral heels.  3. Continue taking Meloxicam.  4. Continue using OTC Powerstep insoles.  5. Return to clinic as needed.     Works assembly line pushing a foot pedal.    Edrick Kins, DPM [facility]  Dr. Edrick Kins, DPM    [address], [place] [address]                Office [phone]  Fax [phone]

## 2019-07-20 ENCOUNTER — Other Ambulatory Visit: Payer: Self-pay | Admitting: Obstetrics & Gynecology

## 2019-07-20 DIAGNOSIS — Z1231 Encounter for screening mammogram for malignant neoplasm of breast: Secondary | ICD-10-CM

## 2019-08-30 ENCOUNTER — Ambulatory Visit
Admission: RE | Admit: 2019-08-30 | Discharge: 2019-08-30 | Disposition: A | Discharge status: Home / Self Care | Payer: Commercial Managed Care - PPO | Source: Ambulatory Visit | Attending: Obstetrics & Gynecology | Admitting: Obstetrics & Gynecology

## 2019-08-30 ENCOUNTER — Other Ambulatory Visit: Payer: Self-pay

## 2019-08-30 DIAGNOSIS — Z1231 Encounter for screening mammogram for malignant neoplasm of breast: Secondary | ICD-10-CM

## 2019-09-11 ENCOUNTER — Other Ambulatory Visit: Payer: Self-pay

## 2019-09-11 ENCOUNTER — Encounter: Payer: Self-pay | Admitting: Obstetrics & Gynecology

## 2019-09-11 ENCOUNTER — Ambulatory Visit (INDEPENDENT_AMBULATORY_CARE_PROVIDER_SITE_OTHER): Payer: Commercial Managed Care - PPO | Admitting: Obstetrics & Gynecology

## 2019-09-11 VITALS — BP 126/78 | Ht 60.0 in | Wt 115.0 lb

## 2019-09-11 DIAGNOSIS — R87610 Atypical squamous cells of undetermined significance on cytologic smear of cervix (ASC-US): Secondary | ICD-10-CM | POA: Diagnosis not present

## 2019-09-11 DIAGNOSIS — Z01419 Encounter for gynecological examination (general) (routine) without abnormal findings: Secondary | ICD-10-CM

## 2019-09-11 DIAGNOSIS — Z780 Asymptomatic menopausal state: Secondary | ICD-10-CM | POA: Diagnosis not present

## 2019-09-11 DIAGNOSIS — Z9851 Tubal ligation status: Secondary | ICD-10-CM

## 2019-09-11 NOTE — Patient Instructions (Signed)
1. Encounter for routine gynecological examination with Papanicolaou smear of cervix Normal gynecologic exam in menopause.  Pap reflex done.  Breast exam normal.  Screening mammogram March 2021 was negative.  We will follow-up here with fasting health labs.  Good body mass index at 22.46.  Increase physical activity to aerobic activities 5 times a week and light weightlifting every 2 days.  Continue with healthy nutrition. - CBC; Future - Comp Met (CMET); Future - Lipid panel; Future - VITAMIN D 25 Hydroxy (Vit-D Deficiency, Fractures); Future - TSH; Future  2. ASCUS of cervix with negative high risk HPV Pap reflex done today.  3. Postmenopause Mild vasomotor menopausal symptoms.  Benefits versus risks of hormone replacement therapy thoroughly discussed.  Patient will think about it and call to start on hormone replacement therapy if decides to do so.  Recommend vitamin D supplements, calcium intake of 1200 mg daily and regular weightbearing physical activities.  4. S/P tubal ligation  Keirstin, it was a pleasure seeing you today!  I will inform you of your results as soon as they are available.

## 2019-09-11 NOTE — Progress Notes (Signed)
Chase Caller Klipfel May 01, 1971 [id number]   History:    49 y.o. G3P3L3 Married.  Status post tubal ligation.  RP:  Established patient presenting for annual gyn exam   HPI:  Postmenopause.  No PMB.  No pelvic pain.  Occasional mild hot flushes and night sweats.  No pain with IC.  Urine normal.  Tendency for constipation.  Breast normal.  Body mass index 22.46.  Needs to exercise more.  Will F/U with Fasting health labs.   Past medical history,surgical history, family history and social history were all reviewed and documented in the EPIC chart.  Gynecologic History No LMP recorded. (Menstrual status: Perimenopausal).  Obstetric History OB History  Gravida Para Term Preterm AB Living  '3 3 2 1   3  ' SAB TAB Ectopic Multiple Live Births          3    # Outcome Date GA Lbr Len/2nd Weight Sex Delivery Anes PTL Lv  3 Term     F CS-Unspec  N LIV  2 Term     F CS-Unspec  N LIV  1 Preterm     M Vag-Spont  Y LIV     ROS: A ROS was performed and pertinent positives and negatives are included in the history.  GENERAL: No fevers or chills. HEENT: No change in vision, no earache, sore throat or sinus congestion. NECK: No pain or stiffness. CARDIOVASCULAR: No chest pain or pressure. No palpitations. PULMONARY: No shortness of breath, cough or wheeze. GASTROINTESTINAL: No abdominal pain, nausea, vomiting or diarrhea, melena or bright red blood per rectum. GENITOURINARY: No urinary frequency, urgency, hesitancy or dysuria. MUSCULOSKELETAL: No joint or muscle pain, no back pain, no recent trauma. DERMATOLOGIC: No rash, no itching, no lesions. ENDOCRINE: No polyuria, polydipsia, no heat or cold intolerance. No recent change in weight. HEMATOLOGICAL: No anemia or easy bruising or bleeding. NEUROLOGIC: No headache, seizures, numbness, tingling or weakness. PSYCHIATRIC: No depression, no loss of interest in normal activity or change in sleep pattern.     Exam:   BP 126/78   Ht 5' (1.524 m)   Wt  115 lb (52.2 kg)   BMI 22.46 kg/m   Body mass index is 22.46 kg/m.  General appearance : Well developed well nourished female. No acute distress HEENT: Eyes: no retinal hemorrhage or exudates,  Neck supple, trachea midline, no carotid bruits, no thyroidmegaly Lungs: Clear to auscultation, no rhonchi or wheezes, or rib retractions  Heart: Regular rate and rhythm, no murmurs or gallops Breast:Examined in sitting and supine position were symmetrical in appearance, no palpable masses or tenderness,  no skin retraction, no nipple inversion, no nipple discharge, no skin discoloration, no axillary or supraclavicular lymphadenopathy Abdomen: no palpable masses or tenderness, no rebound or guarding Extremities: no edema or skin discoloration or tenderness  Pelvic: Vulva: Normal             Vagina: No gross lesions or discharge  Cervix: No gross lesions or discharge.  Pap reflex done.  Uterus  AV, normal size, shape and consistency, non-tender and mobile  Adnexa  Without masses or tenderness  Anus: Normal   Assessment/Plan:  49 y.o. female for annual exam   1. Encounter for routine gynecological examination with Papanicolaou smear of cervix Normal gynecologic exam in menopause.  Pap reflex done.  Breast exam normal.  Screening mammogram March 2021 was negative.  We will follow-up here with fasting health labs.  Good body mass index at 22.46.  Increase physical activity  to aerobic activities 5 times a week and light weightlifting every 2 days.  Continue with healthy nutrition. - CBC; Future - Comp Met (CMET); Future - Lipid panel; Future - VITAMIN D 25 Hydroxy (Vit-D Deficiency, Fractures); Future - TSH; Future  2. ASCUS of cervix with negative high risk HPV Pap reflex done today.  3. Postmenopause Mild vasomotor menopausal symptoms.  Benefits versus risks of hormone replacement therapy thoroughly discussed.  Patient will think about it and call to start on hormone replacement therapy if  decides to do so.  Recommend vitamin D supplements, calcium intake of 1200 mg daily and regular weightbearing physical activities.  4. S/P tubal ligation  Princess Bruins MD, 4:09 PM 09/11/2019

## 2019-09-12 ENCOUNTER — Other Ambulatory Visit: Payer: Self-pay | Admitting: *Deleted

## 2019-09-12 DIAGNOSIS — Z01419 Encounter for gynecological examination (general) (routine) without abnormal findings: Secondary | ICD-10-CM

## 2019-09-12 LAB — CBC
HCT: 37.6 % (ref 35.0–45.0)
Hemoglobin: 12.5 g/dL (ref 11.7–15.5)
MCH: 31.7 pg (ref 27.0–33.0)
MCHC: 33.2 g/dL (ref 32.0–36.0)
MCV: 95.4 fL (ref 80.0–100.0)
MPV: 10.6 fL (ref 7.5–12.5)
Platelets: 231 10*3/uL (ref 140–400)
RBC: 3.94 10*6/uL (ref 3.80–5.10)
RDW: 12.5 % (ref 11.0–15.0)
WBC: 4.2 10*3/uL (ref 3.8–10.8)

## 2019-09-12 LAB — VITAMIN D 25 HYDROXY (VIT D DEFICIENCY, FRACTURES): Vit D, 25-Hydroxy: 24 ng/mL — ABNORMAL LOW (ref 30–100)

## 2019-09-12 LAB — TSH: TSH: 2.06 mIU/L

## 2019-09-12 NOTE — Addendum Note (Signed)
Addended by: Thurnell Garbe A on: 09/12/2019 09:18 AM   Modules accepted: Orders

## 2019-09-14 LAB — PAP IG W/ RFLX HPV ASCU

## 2019-09-15 NOTE — Progress Notes (Signed)
Pap Negative

## 2019-12-11 ENCOUNTER — Other Ambulatory Visit: Payer: Self-pay

## 2019-12-11 ENCOUNTER — Ambulatory Visit (INDEPENDENT_AMBULATORY_CARE_PROVIDER_SITE_OTHER): Payer: Commercial Managed Care - PPO | Admitting: Family Medicine

## 2019-12-11 ENCOUNTER — Encounter: Payer: Self-pay | Admitting: Family Medicine

## 2019-12-11 ENCOUNTER — Ambulatory Visit (INDEPENDENT_AMBULATORY_CARE_PROVIDER_SITE_OTHER): Payer: Commercial Managed Care - PPO

## 2019-12-11 VITALS — BP 99/58 | HR 66 | Temp 98.3°F | Ht 60.0 in | Wt 117.0 lb

## 2019-12-11 DIAGNOSIS — R109 Unspecified abdominal pain: Secondary | ICD-10-CM

## 2019-12-11 DIAGNOSIS — N200 Calculus of kidney: Secondary | ICD-10-CM | POA: Insufficient documentation

## 2019-12-11 DIAGNOSIS — R300 Dysuria: Secondary | ICD-10-CM

## 2019-12-11 LAB — POCT URINALYSIS DIP (MANUAL ENTRY)
Bilirubin, UA: NEGATIVE
Glucose, UA: NEGATIVE mg/dL
Ketones, POC UA: NEGATIVE mg/dL
Leukocytes, UA: NEGATIVE
Nitrite, UA: NEGATIVE
Protein Ur, POC: NEGATIVE mg/dL
Spec Grav, UA: 1.02 (ref 1.010–1.025)
Urobilinogen, UA: 0.2 E.U./dL
pH, UA: 7 (ref 5.0–8.0)

## 2019-12-11 LAB — POC MICROSCOPIC URINALYSIS (UMFC): Mucus: ABSENT

## 2019-12-11 MED ORDER — TAMSULOSIN HCL 0.4 MG PO CAPS: 0.4000 mg | ORAL_CAPSULE | Freq: Every day | ORAL | 0 refills | Status: DC

## 2019-12-11 NOTE — Progress Notes (Signed)
6/28/20213:50 PM  Samantha Robertson [birth year], 49 y.o., female [id number]  Chief Complaint  Patient presents with  . Dysuria    having burning while urination and back pain for 2 wks, self tx otc with azo    HPI:   Patient is a 49 y.o. female with past medical history significant for GERD, GAD, vitamin D deficiency, prediabetes  who presents today for dysuria and flank pain  Having increased frequency with low UOP with dysuria x 2 weeks Associated with bilateral flank pain Denies nausea, vomiting, fever, chills, hematuria Patient is postmenopausal  Denies vaginal discharge, pelvic pain Denies h/o kidney stones Took azo yesterday   Depression screen [facility] 2/9 12/11/2019 06/22/2019 04/28/2019  Decreased Interest 0 0 0  Down, Depressed, Hopeless 0 0 0  PHQ - 2 Score 0 0 0  Altered sleeping - - -  Tired, decreased energy - - -  Change in appetite - - -  Feeling bad or failure about yourself  - - -  Trouble concentrating - - -  Moving slowly or fidgety/restless - - -  Suicidal thoughts - - -  PHQ-9 Score - - -  Difficult doing work/chores - - -    Fall Risk  12/11/2019 06/22/2019 04/28/2019 03/30/2019 02/23/2019  Falls in the past year? 0 0 0 0 0  Number falls in past yr: 0 0 0 0 0  Injury with Fall? 0 0 0 0 0     No Known Allergies  Prior to Admission medications   Not on File    Past Medical History:  Diagnosis Date  . Depression   . Duodenal ulcer   . Prediabetes   . Vitamin D deficiency     Past Surgical History:  Procedure Laterality Date  . CESAREAN SECTION  06/29/06    REPEAT C/S W BTSP  . TUBAL LIGATION  06-29-06   AT TIME OF REPEAT C/S    Social History   Tobacco Use  . Smoking status: Never Smoker  . Smokeless tobacco: Never Used  Substance Use Topics  . Alcohol use: No    Alcohol/week: 0.0 standard drinks    Family History  Problem Relation Age of Onset  . Other Maternal Aunt        Cancer in blood  . Kidney disease Father   . Kidney  disease Sister   . Colon cancer Neg Hx   . Colon polyps Neg Hx   . Diabetes Neg Hx   . Esophageal cancer Neg Hx   . Gallbladder disease Neg Hx   . Heart disease Neg Hx   . Breast cancer Neg Hx     ROS Per hpi  OBJECTIVE:  Today's Vitals   12/11/19 1518  BP: (!) 99/58  Pulse: 66  Temp: 98.3 F (36.8 C)  SpO2: 97%  Weight: 117 lb (53.1 kg)  Height: 5' (1.524 m)   Body mass index is 22.85 kg/m.   BP Readings from Last 3 Encounters:  12/11/19 (!) 99/58  09/11/19 126/78  06/22/19 100/60   Wt Readings from Last 3 Encounters:  12/11/19 117 lb (53.1 kg)  09/11/19 115 lb (52.2 kg)  06/22/19 114 lb 12.8 oz (52.1 kg)     Physical Exam Vitals and nursing note reviewed.  Constitutional:      Appearance: She is well-developed. She is not toxic-appearing.  HENT:     Head: Normocephalic and atraumatic.     Mouth/Throat:     Pharynx: No oropharyngeal exudate.  Eyes:  General: No scleral icterus.    Conjunctiva/sclera: Conjunctivae normal.     Pupils: Pupils are equal, round, and reactive to light.  Cardiovascular:     Rate and Rhythm: Normal rate and regular rhythm.     Heart sounds: Normal heart sounds. No murmur heard.  No friction rub. No gallop.   Pulmonary:     Effort: Pulmonary effort is normal.     Breath sounds: Normal breath sounds. No wheezing or rales.  Abdominal:     General: Bowel sounds are normal. There is no distension.     Palpations: There is no hepatomegaly or splenomegaly.     Tenderness: There is abdominal tenderness in the left lower quadrant. There is left CVA tenderness. There is no right CVA tenderness, guarding or rebound.  Musculoskeletal:     Cervical back: Neck supple.  Skin:    General: Skin is warm and dry.  Neurological:     Mental Status: She is alert and oriented to person, place, and time.     Results for orders placed or performed in visit on 12/11/19 (from the past 24 hour(s))  POCT urinalysis dipstick     Status:  Abnormal   Collection Time: 12/11/19  3:33 PM  Result Value Ref Range   Color, UA yellow yellow   Clarity, UA clear clear   Glucose, UA negative negative mg/dL   Bilirubin, UA negative negative   Ketones, POC UA negative negative mg/dL   Spec Grav, UA 1.020 1.010 - 1.025   Blood, UA moderate (A) negative   pH, UA 7.0 5.0 - 8.0   Protein Ur, POC negative negative mg/dL   Urobilinogen, UA 0.2 0.2 or 1.0 E.U./dL   Nitrite, UA Negative Negative   Leukocytes, UA Negative Negative  POCT Microscopic Urinalysis ([facility])     Status: Abnormal   Collection Time: 12/11/19  4:01 PM  Result Value Ref Range   WBC,UR,HPF,POC Few (A) None WBC/hpf   RBC,UR,HPF,POC Few (A) None RBC/hpf   Bacteria Few (A) None, Too numerous to count   Mucus Absent Absent   Epithelial Cells, UR Per Microscopy Few (A) None, Too numerous to count cells/hpf    DG Abd 1 View  Result Date: 12/11/2019 CLINICAL DATA:  Left-sided flank pain with dysuria and microscopic hematuria. EXAM: ABDOMEN - 1 VIEW COMPARISON:  None. FINDINGS: The bowel gas pattern is normal. No definitive renal or ureteral calculi. However, there is a 4 mm poorly defined density overlying the left sacral ala which could represent a stone in the ureter. Tiny phleboliths in the left side of the pelvis. Bones are normal. IMPRESSION: 4 mm density overlying the left sacral ala could represent a stone in the left ureter. Otherwise, benign appearing abdomen. Electronically Signed   By: Lorriane Shire M.D.   On: 12/11/2019 16:24     ASSESSMENT and PLAN  1. Kidney stone on left side Discussed supportive measures, new meds r/se/b and RTC precautions. Patient educational handout given.  2. Dysuria - POCT urinalysis dipstick - POCT Microscopic Urinalysis ([facility]) - DG Abd 1 View - Urine Culture  3. Acute left flank pain - DG Abd 1 View  Other orders - tamsulosin (FLOMAX) 0.4 MG CAPS capsule; Take 1 capsule (0.4 mg total) by mouth daily after supper.  No  follow-ups on file.    Rutherford Guys, MD [facility], [place] [address] Ph.  [phone] Fax [phone]

## 2019-12-11 NOTE — Patient Instructions (Addendum)
If you have lab work done today you will be contacted with your lab results within the next 2 weeks.  If you have not heard from [place] then please contact us. The fastest way to get your results is to register for My Chart.   IF you received an x-ray today, you will receive an invoice from [facility]. Please contact [facility] at [phone] with questions or concerns regarding your invoice.   IF you received labwork today, you will receive an invoice from [place]. Please contact [facility] at [phone] with questions or concerns regarding your invoice.   Our billing staff will not be able to assist you with questions regarding bills from these companies.  You will be contacted with the lab results as soon as they are available. The fastest way to get your results is to activate your My Chart account. Instructions are located on the last page of this paperwork. If you have not heard from [place] regarding the results in 2 weeks, please contact this office.     Clculos renales Kidney Stones Los clculos renales son masas parecidas a piedras que se forman en el interior de los riones. Los riones son los rganos que producen el pis (la [place]). Un clculo renal puede desplazarse hacia otras partes de las vas urinarias, por ejemplo:  Los conductos que [employer] riones con la vejiga (urteres).  La vejiga.  El conducto que lleva la orina hacia fuera del cuerpo (uretra). Los clculos renales puede provocar un dolor muy intenso y obstruir el paso del pis. Por lo general, el clculo sale del cuerpo (se elimina) a travs del pis. Es posible que un mdico deba extraerle el clculo. Cules son las causas? Los clculos renales pueden ser causados por lo siguiente:  Un trastorno que hace que ciertas glndulas produzcan demasiada hormona paratiroidea (hiperparatiroidismo primario).  La acumulacin de un tipo de cristales en la vejiga formados por una sustancia qumica  llamada cido rico. El cuerpo produce cido rico cuando se consumen ciertos alimentos.  Estrechamiento (estenosis) de un urter o de ambos.  Una obstruccin en los riones que existe desde el nacimiento.  Una ciruga realizada en el rin o los urteres, como una ciruga de bypass gstrico. Qu incrementa el riesgo? Es ms probable que contraiga esta afeccin si:  Ya tuvo clculos renales anteriormente.  Tiene antecedentes familiares de clculos renales.  No bebe suficiente agua.  Sigue una dieta rica en protenas, sal (sodio) o azcar.  Tiene sobrepeso (es obeso). Cules son los signos o sntomas? Los sntomas de clculos renales pueden incluir los siguientes:  Dolor en el costado del abdomen, justo debajo de las costillas (dolor en la fosa lumbar). Dolor que generalmente se expande (irradia) hacia la ingle.  Necesidad frecuente o inmediata (urgente) de hacer pis.  Dolor al hacer pis (orinar).  Sangre en el pis (hematuria).  Sensacin de que va a vomitar (nuseas).  Vmitos.  Cristy Hilts y escalofros. Cmo se trata? El tratamiento depende del tamao, la ubicacin y la composicin de los clculos renales. Los clculos renales suelen salir del cuerpo a travs del pis. Puede ser necesario hacer lo siguiente:  Beber ms lquido para ayudar a Insurance account manager. En algunos casos, pueden administrarle lquidos por un tubo (catter) intravenoso colocado en una de sus venas en el hospital.  Tomar analgsicos.  Hacer cambios en su alimentacin para ayudar a prevenir que los clculos renales regresen. A veces, se necesitan procedimientos mdicos para extraer un clculo renal.  Esto puede incluir lo siguiente:  Un procedimiento para romper los clculos renales utilizando un haz de luz (lser) u ondas de choque.  Ciruga para extraer los clculos renales. Siga estas instrucciones en su casa: Medicamentos  Use los medicamentos de venta libre y los recetados solamente como se  lo haya indicado el mdico.  Pregntele al mdico si el medicamento recetado le impide conducir o usar maquinaria pesada. Comida y bebida  Beba suficiente lquido como para Theatre manager la orina de color amarillo plido. Es posible que le digan que beba al [place] 8 y 10vasos de agua por Training and development officer. Esto lo ayudar a eliminar el clculo renal.  Si el mdico se lo indica, haga cambios en su alimentacin. Esto puede incluir: ? Limitar la cantidad de sal que consume. ? Comer ms frutas y verduras. ? Limitar la ingesta de carne de res, carne de ave, pescado y huevos.  Siga las indicaciones del mdico respecto de las restricciones en las comidas o las bebidas. Instrucciones generales  Recolecte las muestras de pis como se lo haya indicado el mdico. Deber recolectar una muestra de pis en los siguientes momentos: ? 24horas despus de eliminar un clculo. ? Entre 8y12semanas despus de haber eliminado el clculo, y cada [id number] despus de eso.  Cuele el pis cada vez que haga pis (orine) durante el tiempo que le indiquen. Use el colador que le recomiende el mdico.  No deseche el clculo. Consrvelo para que el mdico lo pueda Physiological scientist.  Concurra a todas visitas de seguimiento como se lo haya indicado el mdico. Esto es importante. Es posible que deba realizarse exmenes de seguimiento. Cmo se previene? Para prevenir la formacin de otro clculo renal:  Beba suficiente lquido como para mantener la orina de color amarillo plido. Esta es la mejor manera de prevenir la formacin de clculos renales.  Consuma alimentos saludables.  Evite determinados alimentos segn se lo haya indicado el mdico. Es posible que le indiquen que consuma menos protenas.  Mantenga un peso saludable. Dnde buscar ms informacin  [employer]) ([place]): www.kidney.[facility]) ([employer] Urolgicos):  www.urologyhealth.org Comunquese con un mdico si:  Tiene un dolor que empeora o que no mejora con los medicamentos. Solicite ayuda inmediatamente si:  Tiene fiebre o escalofros.  Siente un dolor muy intenso.  Tiene un dolor nuevo en el vientre (abdomen).  Pierde el conocimiento (se desmaya).  No puede hacer pis. Resumen  Los clculos renales son masas parecidas a piedras que se forman en el interior de los riones.  Los clculos renales puede provocar un dolor muy intenso y obstruir el paso del pis.  Los clculos renales suelen salir del cuerpo a travs del pis.  Beba suficiente lquido como para Theatre manager la orina de color amarillo plido. Esta informacin no tiene Marine scientist el consejo del mdico. Asegrese de hacerle al mdico cualquier pregunta que tenga. Document Revised: 12/07/2018 Document Reviewed: 12/07/2018 Elsevier Patient Education  [place].

## 2019-12-13 LAB — URINE CULTURE: Organism ID, Bacteria: NO GROWTH

## 2020-01-04 ENCOUNTER — Other Ambulatory Visit: Payer: Self-pay | Admitting: Family Medicine

## 2020-03-20 ENCOUNTER — Encounter: Payer: Self-pay | Admitting: Emergency Medicine

## 2020-03-20 ENCOUNTER — Ambulatory Visit (INDEPENDENT_AMBULATORY_CARE_PROVIDER_SITE_OTHER): Payer: Commercial Managed Care - PPO | Admitting: Emergency Medicine

## 2020-03-20 ENCOUNTER — Other Ambulatory Visit: Payer: Self-pay

## 2020-03-20 VITALS — BP 108/64 | HR 57 | Temp 98.0°F | Resp 16 | Ht 61.0 in | Wt 118.0 lb

## 2020-03-20 DIAGNOSIS — H9193 Unspecified hearing loss, bilateral: Secondary | ICD-10-CM

## 2020-03-20 DIAGNOSIS — Z23 Encounter for immunization: Secondary | ICD-10-CM | POA: Diagnosis not present

## 2020-03-20 NOTE — Progress Notes (Signed)
Samantha Robertson 49 y.o.   Chief Complaint  Patient presents with  . Ear Problem    per patient for 2 months with ringing and itching in both ears    HISTORY OF PRESENT ILLNESS: This is a 49 y.o. female complaining of bilateral ear problems for at least 2 months.  Complaining of ringing and itching along with hearing loss bilaterally.  Denies trauma or any other associated symptoms. No other complaints or medical concerns today.  HPI   Prior to Admission medications   Medication Sig Start Date End Date Taking? Authorizing Provider  tamsulosin (FLOMAX) 0.4 MG CAPS capsule Take 1 capsule (0.4 mg total) by mouth daily after supper. Patient not taking: Reported on 03/20/2020 12/11/19   Rutherford Guys, MD    No Known Allergies  Patient Active Problem List   Diagnosis Date Noted  . Kidney stone on left side 12/11/2019  . Osteoarthritis of left foot 01/21/2017  . Pain of both heels 01/01/2017  . Abdominal pain, chronic, epigastric 05/07/2014  . Varicose veins of lower extremities with other complications 22/29/7989  . Vitamin D insufficiency 01/22/2012  . ONYCHOMYCOSIS, TOENAILS 12/06/2009  . DERMATOPHYTOSIS OF FOOT 12/06/2009  . EPIGASTRIC PAIN 03/09/2008  . CHEST PAIN 07/11/2007  . Prediabetes 07/11/2007    Past Medical History:  Diagnosis Date  . Depression   . Duodenal ulcer   . Prediabetes   . Vitamin D deficiency     Past Surgical History:  Procedure Laterality Date  . CESAREAN SECTION  06/29/06    REPEAT C/S W BTSP  . TUBAL LIGATION  06-29-06   AT TIME OF REPEAT C/S    Social History   Socioeconomic History  . Marital status: Married    Spouse name: Not on file  . Number of children: 3  . Years of education: Not on file  . Highest education level: Not on file  Occupational History  . Occupation: Oncologist  Tobacco Use  . Smoking status: Never Smoker  . Smokeless tobacco: Never Used  Vaping Use  . Vaping Use: Never used  Substance and Sexual  Activity  . Alcohol use: No    Alcohol/week: 0.0 standard drinks  . Drug use: No  . Sexual activity: Yes    Partners: Male    Birth control/protection: Surgical    Comment: 1st intercourse- 58, partners- 83, married- 33 yrs   Other Topics Concern  . Not on file  Social History Narrative  . Not on file   Social Determinants of Health   Financial Resource Strain:   . Difficulty of Paying Living Expenses: Not on file  Food Insecurity:   . Worried About Charity fundraiser in the Last Year: Not on file  . Ran Out of Food in the Last Year: Not on file  Transportation Needs:   . Lack of Transportation (Medical): Not on file  . Lack of Transportation (Non-Medical): Not on file  Physical Activity:   . Days of Exercise per Week: Not on file  . Minutes of Exercise per Session: Not on file  Stress:   . Feeling of Stress : Not on file  Social Connections:   . Frequency of Communication with Friends and Family: Not on file  . Frequency of Social Gatherings with Friends and Family: Not on file  . Attends Religious Services: Not on file  . Active Member of Clubs or Organizations: Not on file  . Attends Archivist Meetings: Not on file  . Marital  Status: Not on file  Intimate Partner Violence:   . Fear of Current or Ex-Partner: Not on file  . Emotionally Abused: Not on file  . Physically Abused: Not on file  . Sexually Abused: Not on file    Family History  Problem Relation Age of Onset  . Other Maternal Aunt        Cancer in blood  . Kidney disease Father   . Kidney disease Sister   . Colon cancer Neg Hx   . Colon polyps Neg Hx   . Diabetes Neg Hx   . Esophageal cancer Neg Hx   . Gallbladder disease Neg Hx   . Heart disease Neg Hx   . Breast cancer Neg Hx      Review of Systems  Constitutional: Negative.  Negative for chills and fever.  HENT: Positive for hearing loss and tinnitus. Negative for congestion, ear discharge, ear pain, nosebleeds and sore throat.     Respiratory: Negative.  Negative for cough and shortness of breath.   Cardiovascular: Negative.  Negative for chest pain and palpitations.  Gastrointestinal: Negative.  Negative for abdominal pain, diarrhea, nausea and vomiting.  Genitourinary: Negative for dysuria and hematuria.  Musculoskeletal: Negative.   Skin: Negative.  Negative for rash.  Neurological: Negative.  Negative for dizziness and headaches.  All other systems reviewed and are negative.    Physical Exam Vitals reviewed.  Constitutional:      Appearance: Normal appearance.  HENT:     Head: Normocephalic.     Right Ear: Tympanic membrane, ear canal and external ear normal.     Left Ear: Tympanic membrane, ear canal and external ear normal.     Mouth/Throat:     Mouth: Mucous membranes are moist.     Pharynx: Oropharynx is clear.  Eyes:     Extraocular Movements: Extraocular movements intact.     Conjunctiva/sclera: Conjunctivae normal.     Pupils: Pupils are equal, round, and reactive to light.  Cardiovascular:     Rate and Rhythm: Normal rate.  Pulmonary:     Effort: Pulmonary effort is normal.  Musculoskeletal:        General: Normal range of motion.     Cervical back: Normal range of motion and neck supple. No tenderness.  Lymphadenopathy:     Cervical: No cervical adenopathy.  Skin:    General: Skin is warm and dry.     Capillary Refill: Capillary refill takes less than 2 seconds.  Neurological:     General: No focal deficit present.     Mental Status: She is alert and oriented to person, place, and time.  Psychiatric:        Mood and Affect: Mood normal.        Behavior: Behavior normal.      ASSESSMENT & PLAN: Samantha Robertson was seen today for ear problem.  Diagnoses and all orders for this visit:  Bilateral hearing loss, unspecified hearing loss type -     Ambulatory referral to ENT  Need for prophylactic vaccination and inoculation against influenza -     Flu Vaccine QUAD 36+ mos  IM    Patient Instructions       If you have lab work done today you will be contacted with your lab results within the next 2 weeks.  If you have not heard from [place] then please contact us. The fastest way to get your results is to register for My Chart.   IF you received an x-ray today,  you will receive an invoice from [facility]. Please contact [facility] at [phone] with questions or concerns regarding your invoice.   IF you received labwork today, you will receive an invoice from [place]. Please contact [facility] at [phone] with questions or concerns regarding your invoice.   Our billing staff will not be able to assist you with questions regarding bills from these companies.  You will be contacted with the lab results as soon as they are available. The fastest way to get your results is to activate your My Chart account. Instructions are located on the last page of this paperwork. If you have not heard from [place] regarding the results in 2 weeks, please contact this office.     Prdida auditiva Hearing Loss La prdida auditiva es la prdida total o parcial de la capacidad de or. Puede ser transitoria o Radene Ou, y ocurrir en uno o en ambos odos. Se necesita atencin mdica para tratar correctamente la prdida [place] y para evitar que la afeccin empeore. Es posible recuperar la audicin parcial o totalmente, en funcin de la causa de la prdida [place] y de su gravedad. En algunos casos la prdida auditiva es permanente. Cules son las causas? Las causas frecuentes de la prdida auditiva incluyen lo siguiente:  Exceso de cerumen en el conducto auditivo externo.  Infeccin del conducto auditivo externo o del odo medio.  Lquido en el odo medio.  Lesin en el odo o en la zona alrededor del odo.  Un objeto atascado en el odo.  Antecedentes de exposicin prolongada a ruidos fuertes, Programmer, systems. Las causas menos frecuentes de la  prdida auditiva incluyen lo siguiente:  Tumores en el odo.  Infecciones bacterianas o virales, como meningitis.  Un orificio en la membrana del tmpano (membrana del tmpano perforada).  Problemas en el nervio auditivo que enva las seales entre el cerebro y el odo.  Ciertos medicamentos. Cules son los signos o los sntomas? Los sntomas de esta afeccin pueden incluir los siguientes:  Dificultad para Product manager diferencia entre los sonidos.  Dificultad para seguir una conversacin cuando hay ruido ambiental.  Ausencia de respuesta a los ruidos del entorno. Esto puede ser ms notorio cuando no hay respuesta a los ruidos inesperados.  Necesidad de subir el volumen del Development worker, community, la radio u otros dispositivos.  Pitidos en el odo.  Mareos. Cmo se diagnostica? Esta afeccin se diagnostica en funcin de lo siguiente:  Un examen fsico.  Una prueba de audicin Lorel Monaco). Un especialista en audicin (audilogo) realizar la [place]. Es posible que lo deriven a un especialista en garganta, Doran Durand y odo (otorrinolaringlogo). Cmo se trata? El tratamiento para la prdida de la audicin incluye lo siguiente:  Extraccin del cerumen.  Medicamentos para tratar o prevenir una infeccin (antibiticos).  Medicamentos para reducir la inflamacin (corticoesteroides).  Audfonos para la prdida [place] relacionada con el dao nervioso. Siga estas instrucciones en su casa:  Si le recetaron un antibitico, tmelo como se lo haya indicado el mdico. No deje de tomar los antibiticos aunque comience a Sports administrator.  Tome los medicamentos de venta libre y los recetados solamente como se lo haya indicado el mdico.  Evite los ruidos fuertes.  Retome sus actividades normales segn lo indicado por el mdico. Pregntele al mdico qu actividades son seguras para usted.  Concurra a todas las visitas de seguimiento como se lo haya indicado el mdico. Esto es  importante. Comunquese con un mdico si:  Siente mareos.  Tiene nuevos sntomas.  Vomita  o siente nuseas.  Tiene fiebre. Solicite ayuda inmediatamente si:  Percibe cambios repentinos en la visin.  Tiene dolor intenso de odos.  Aumenta su fatiga o debilidad.  Tiene un dolor de cabeza intenso. Resumen  La prdida [place] es una disminucin de la capacidad de or sonidos a su alrededor. Puede ser transitoria o permanente.  El tratamiento depender de la causa de la prdida [place]. Puede incluir la extraccin de cerumen, medicamentos o un audfono.  Es posible recuperar la audicin parcial o totalmente, en funcin de la causa de la prdida [place] y de su gravedad.  Concurra a todas las visitas de seguimiento como se lo haya indicado el mdico. Esto es importante. Esta informacin no tiene Marine scientist el consejo del mdico. Asegrese de hacerle al mdico cualquier pregunta que tenga. Document Revised: 04/14/2018 Document Reviewed: 04/14/2018 Elsevier Patient Education  2020 Elsevier Inc.      Agustina Caroli, MD [facility]

## 2020-03-20 NOTE — Patient Instructions (Addendum)
If you have lab work done today you will be contacted with your lab results within the next 2 weeks.  If you have not heard from [place] then please contact us. The fastest way to get your results is to register for My Chart.   IF you received an x-ray today, you will receive an invoice from [facility]. Please contact [facility] at [phone] with questions or concerns regarding your invoice.   IF you received labwork today, you will receive an invoice from [place]. Please contact [facility] at [phone] with questions or concerns regarding your invoice.   Our billing staff will not be able to assist you with questions regarding bills from these companies.  You will be contacted with the lab results as soon as they are available. The fastest way to get your results is to activate your My Chart account. Instructions are located on the last page of this paperwork. If you have not heard from [place] regarding the results in 2 weeks, please contact this office.     Prdida auditiva Hearing Loss La prdida auditiva es la prdida total o parcial de la capacidad de or. Puede ser transitoria o Radene Ou, y ocurrir en uno o en ambos odos. Se necesita atencin mdica para tratar correctamente la prdida [place] y para evitar que la afeccin empeore. Es posible recuperar la audicin parcial o totalmente, en funcin de la causa de la prdida [place] y de su gravedad. En algunos casos la prdida auditiva es permanente. Cules son las causas? Las causas frecuentes de la prdida auditiva incluyen lo siguiente:  Exceso de cerumen en el conducto auditivo externo.  Infeccin del conducto auditivo externo o del odo medio.  Lquido en el odo medio.  Lesin en el odo o en la zona alrededor del odo.  Un objeto atascado en el odo.  Antecedentes de exposicin prolongada a ruidos fuertes, Programmer, systems. Las causas menos frecuentes de la prdida auditiva incluyen lo  siguiente:  Tumores en el odo.  Infecciones bacterianas o virales, como meningitis.  Un orificio en la membrana del tmpano (membrana del tmpano perforada).  Problemas en el nervio auditivo que enva las seales entre el cerebro y el odo.  Ciertos medicamentos. Cules son los signos o los sntomas? Los sntomas de esta afeccin pueden incluir los siguientes:  Dificultad para Product manager diferencia entre los sonidos.  Dificultad para seguir una conversacin cuando hay ruido ambiental.  Ausencia de respuesta a los ruidos del entorno. Esto puede ser ms notorio cuando no hay respuesta a los ruidos inesperados.  Necesidad de subir el volumen del Development worker, community, la radio u otros dispositivos.  Pitidos en el odo.  Mareos. Cmo se diagnostica? Esta afeccin se diagnostica en funcin de lo siguiente:  Un examen fsico.  Una prueba de audicin Lorel Monaco). Un especialista en audicin (audilogo) realizar la [place]. Es posible que lo deriven a un especialista en garganta, Doran Durand y odo (otorrinolaringlogo). Cmo se trata? El tratamiento para la prdida de la audicin incluye lo siguiente:  Extraccin del cerumen.  Medicamentos para tratar o prevenir una infeccin (antibiticos).  Medicamentos para reducir la inflamacin (corticoesteroides).  Audfonos para la prdida [place] relacionada con el dao nervioso. Siga estas instrucciones en su casa:  Si le recetaron un antibitico, tmelo como se lo haya indicado el mdico. No deje de tomar los antibiticos aunque comience a Sports administrator.  Tome los medicamentos de venta libre y los recetados solamente como se lo haya indicado el mdico.  Evite  los ruidos fuertes.  Retome sus actividades normales segn lo indicado por el mdico. Pregntele al mdico qu actividades son seguras para usted.  Concurra a todas las visitas de seguimiento como se lo haya indicado el mdico. Esto es importante. Comunquese con un mdico  si:  Siente mareos.  Tiene nuevos sntomas.  Vomita o siente nuseas.  Tiene fiebre. Solicite ayuda inmediatamente si:  Percibe cambios repentinos en la visin.  Tiene dolor intenso de odos.  Aumenta su fatiga o debilidad.  Tiene un dolor de cabeza intenso. Resumen  La prdida [place] es una disminucin de la capacidad de or sonidos a su alrededor. Puede ser transitoria o permanente.  El tratamiento depender de la causa de la prdida [place]. Puede incluir la extraccin de cerumen, medicamentos o un audfono.  Es posible recuperar la audicin parcial o totalmente, en funcin de la causa de la prdida [place] y de su gravedad.  Concurra a todas las visitas de seguimiento como se lo haya indicado el mdico. Esto es importante. Esta informacin no tiene Marine scientist el consejo del mdico. Asegrese de hacerle al mdico cualquier pregunta que tenga. Document Revised: 04/14/2018 Document Reviewed: 04/14/2018 Elsevier Patient Education  2020 [employer].

## 2020-03-21 ENCOUNTER — Telehealth: Payer: Self-pay

## 2020-03-21 NOTE — Telephone Encounter (Signed)
LVM to inform patient of the ENT referral name and number [phone] Dr Radene Journey

## 2020-04-16 ENCOUNTER — Other Ambulatory Visit: Payer: Commercial Managed Care - PPO

## 2020-04-16 ENCOUNTER — Other Ambulatory Visit: Payer: Self-pay

## 2020-04-16 ENCOUNTER — Ambulatory Visit (INDEPENDENT_AMBULATORY_CARE_PROVIDER_SITE_OTHER): Payer: Commercial Managed Care - PPO | Admitting: Otolaryngology

## 2020-04-16 DIAGNOSIS — Z01419 Encounter for gynecological examination (general) (routine) without abnormal findings: Secondary | ICD-10-CM

## 2020-04-17 LAB — COMPREHENSIVE METABOLIC PANEL
AG Ratio: 2 (calc) (ref 1.0–2.5)
ALT: 19 U/L (ref 6–29)
AST: 18 U/L (ref 10–35)
Albumin: 4.5 g/dL (ref 3.6–5.1)
Alkaline phosphatase (APISO): 63 U/L (ref 31–125)
BUN: 16 mg/dL (ref 7–25)
CO2: 27 mmol/L (ref 20–32)
Calcium: 9.5 mg/dL (ref 8.6–10.2)
Chloride: 105 mmol/L (ref 98–110)
Creat: 0.74 mg/dL (ref 0.50–1.10)
Globulin: 2.2 g/dL (calc) (ref 1.9–3.7)
Glucose, Bld: 95 mg/dL (ref 65–99)
Potassium: 4.2 mmol/L (ref 3.5–5.3)
Sodium: 141 mmol/L (ref 135–146)
Total Bilirubin: 0.7 mg/dL (ref 0.2–1.2)
Total Protein: 6.7 g/dL (ref 6.1–8.1)

## 2020-04-17 LAB — LIPID PANEL
Cholesterol: 181 mg/dL (ref <200)
HDL: 48 mg/dL — ABNORMAL LOW (ref >=50)
LDL Cholesterol (Calc): 109 mg/dL (calc) — ABNORMAL HIGH
Non-HDL Cholesterol (Calc): 133 mg/dL (calc) — ABNORMAL HIGH (ref <130)
Total CHOL/HDL Ratio: 3.8 (calc) (ref <5.0)
Triglycerides: 125 mg/dL (ref <150)

## 2020-04-26 ENCOUNTER — Encounter (INDEPENDENT_AMBULATORY_CARE_PROVIDER_SITE_OTHER): Payer: Self-pay | Admitting: Otolaryngology

## 2020-04-26 ENCOUNTER — Other Ambulatory Visit: Payer: Self-pay

## 2020-04-26 ENCOUNTER — Ambulatory Visit (INDEPENDENT_AMBULATORY_CARE_PROVIDER_SITE_OTHER): Payer: Commercial Managed Care - PPO | Admitting: Otolaryngology

## 2020-04-26 VITALS — Temp 97.7°F

## 2020-04-26 DIAGNOSIS — H903 Sensorineural hearing loss, bilateral: Secondary | ICD-10-CM | POA: Diagnosis not present

## 2020-04-26 NOTE — Progress Notes (Signed)
HPI: Samantha Robertson is a 49 y.o. female who presents is referred by her PCP for evaluation of hearing loss.  She presents today with a translator as she does not understand [place] well.  She has noticed hearing loss in the left ear for over a year.  She thinks it might be related to working in noisy environment.  She feels like there might be fluid inside her ear and describes pressure in the left ear.  She also notices a noise or tinnitus in the left ear She denies any headaches or vertigo or dizziness..  Past Medical History:  Diagnosis Date  . Depression   . Duodenal ulcer   . Prediabetes   . Vitamin D deficiency    Past Surgical History:  Procedure Laterality Date  . CESAREAN SECTION  06/29/06    REPEAT C/S W BTSP  . TUBAL LIGATION  06-29-06   AT TIME OF REPEAT C/S   Social History   Socioeconomic History  . Marital status: Married    Spouse name: Not on file  . Number of children: 3  . Years of education: Not on file  . Highest education level: Not on file  Occupational History  . Occupation: Oncologist  Tobacco Use  . Smoking status: Never Smoker  . Smokeless tobacco: Never Used  Vaping Use  . Vaping Use: Never used  Substance and Sexual Activity  . Alcohol use: No    Alcohol/week: 0.0 standard drinks  . Drug use: No  . Sexual activity: Yes    Partners: Male    Birth control/protection: Surgical    Comment: 1st intercourse- 53, partners- 25, married- 55 yrs   Other Topics Concern  . Not on file  Social History Narrative  . Not on file   Social Determinants of Health   Financial Resource Strain:   . Difficulty of Paying Living Expenses: Not on file  Food Insecurity:   . Worried About Charity fundraiser in the Last Year: Not on file  . Ran Out of Food in the Last Year: Not on file  Transportation Needs:   . Lack of Transportation (Medical): Not on file  . Lack of Transportation (Non-Medical): Not on file  Physical Activity:   . Days of Exercise per  Week: Not on file  . Minutes of Exercise per Session: Not on file  Stress:   . Feeling of Stress : Not on file  Social Connections:   . Frequency of Communication with Friends and Family: Not on file  . Frequency of Social Gatherings with Friends and Family: Not on file  . Attends Religious Services: Not on file  . Active Member of Clubs or Organizations: Not on file  . Attends Archivist Meetings: Not on file  . Marital Status: Not on file   Family History  Problem Relation Age of Onset  . Other Maternal Aunt        Cancer in blood  . Kidney disease Father   . Kidney disease Sister   . Colon cancer Neg Hx   . Colon polyps Neg Hx   . Diabetes Neg Hx   . Esophageal cancer Neg Hx   . Gallbladder disease Neg Hx   . Heart disease Neg Hx   . Breast cancer Neg Hx    No Known Allergies Prior to Admission medications   Medication Sig Start Date End Date Taking? Authorizing Provider  tamsulosin (FLOMAX) 0.4 MG CAPS capsule Take 1 capsule (0.4 mg total) by  mouth daily after supper. 12/11/19  Yes Rutherford Guys, MD     Positive ROS: Otherwise negative  All other systems have been reviewed and were otherwise negative with the exception of those mentioned in the HPI and as above.  Physical Exam: Constitutional: Alert, well-appearing, no acute distress Ears: External ears without lesions or tenderness. Ear canals are clear bilaterally with no wax buildup.  TMs are clear bilaterally.  No middle ear effusion noted. Nasal: External nose without lesions.. Clear nasal passages Oral: Lips and gums without lesions. Tongue and palate mucosa without lesions. Posterior oropharynx clear. Neck: No palpable adenopathy or masses Respiratory: Breathing comfortably  Skin: No facial/neck lesions or rash noted.  Audiologic testing demonstrated left ear sensorineural hearing loss which is worse in the lower frequencies.  And a mild right ear mixed hearing loss.  She had type A tympanograms  bilaterally.  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  Plan: Would recommend obtaining MRI scan additionally to rule out cochlear or retrocochlear pathology.  She will call us concerning results of the MRI scan after this is performed. She would be a candidate for hearing aids and discussed this with her.   Radene Journey, MD   CC:

## 2020-04-29 ENCOUNTER — Other Ambulatory Visit (INDEPENDENT_AMBULATORY_CARE_PROVIDER_SITE_OTHER): Payer: Self-pay

## 2020-04-29 ENCOUNTER — Encounter (INDEPENDENT_AMBULATORY_CARE_PROVIDER_SITE_OTHER): Payer: Self-pay

## 2020-04-29 DIAGNOSIS — H903 Sensorineural hearing loss, bilateral: Secondary | ICD-10-CM

## 2020-05-21 ENCOUNTER — Ambulatory Visit
Admission: RE | Admit: 2020-05-21 | Discharge: 2020-05-21 | Disposition: A | Discharge status: Home / Self Care | Payer: Commercial Managed Care - PPO | Source: Ambulatory Visit | Attending: Otolaryngology | Admitting: Otolaryngology

## 2020-05-21 ENCOUNTER — Other Ambulatory Visit: Payer: Self-pay

## 2020-05-21 DIAGNOSIS — H903 Sensorineural hearing loss, bilateral: Secondary | ICD-10-CM

## 2020-05-21 MED ORDER — GADOBENATE DIMEGLUMINE 529 MG/ML IV SOLN
10.0000 mL | Freq: Once | INTRAVENOUS | Status: AC | PRN
  Administered 2020-05-21: 10 mL via INTRAVENOUS

## 2020-06-03 ENCOUNTER — Telehealth (INDEPENDENT_AMBULATORY_CARE_PROVIDER_SITE_OTHER): Payer: Self-pay | Admitting: Otolaryngology

## 2020-06-03 ENCOUNTER — Telehealth: Payer: Self-pay | Admitting: Emergency Medicine

## 2020-06-03 NOTE — Telephone Encounter (Signed)
Patient's son called concerning results of the MRI scan for evaluation of asymmetric sensorineural hearing loss.  MRI scan was normal with no intracranial abnormality causing hearing loss noted. Reviewed with the son concerning treatment options to be limited but would recommend obtaining hearing aids for treatment of hearing loss.

## 2020-06-03 NOTE — Telephone Encounter (Signed)
Patient told by Dr. Radene Journey she needs a referral for an ear doctor. Please advise at [phone]

## 2020-06-04 NOTE — Telephone Encounter (Signed)
No answer unsure why this would be needed

## 2020-06-04 NOTE — Telephone Encounter (Signed)
Would you like to see her or would you just like me to send referral out?

## 2020-06-04 NOTE — Telephone Encounter (Signed)
Not sure what they need.  Dr. Lucia Gaskins is an ENT doctor.  Please clarify.  Thanks.

## 2020-06-05 NOTE — Telephone Encounter (Signed)
Still unable to reach pt, if she calls again have her speak with clinical as she is already seeing ear specialist.

## 2020-06-24 ENCOUNTER — Ambulatory Visit (INDEPENDENT_AMBULATORY_CARE_PROVIDER_SITE_OTHER): Payer: Commercial Managed Care - PPO | Admitting: Emergency Medicine

## 2020-06-24 ENCOUNTER — Other Ambulatory Visit: Payer: Self-pay

## 2020-06-24 ENCOUNTER — Encounter: Payer: Self-pay | Admitting: Emergency Medicine

## 2020-06-24 VITALS — BP 96/59 | HR 64 | Temp 98.3°F | Resp 16 | Ht 61.0 in | Wt 119.0 lb

## 2020-06-24 DIAGNOSIS — H903 Sensorineural hearing loss, bilateral: Secondary | ICD-10-CM

## 2020-06-24 DIAGNOSIS — F4321 Adjustment disorder with depressed mood: Secondary | ICD-10-CM

## 2020-06-24 DIAGNOSIS — Z1211 Encounter for screening for malignant neoplasm of colon: Secondary | ICD-10-CM

## 2020-06-24 MED ORDER — ESCITALOPRAM OXALATE 10 MG PO TABS: 10.0000 mg | ORAL_TABLET | Freq: Every day | ORAL | 1 refills | Status: DC

## 2020-06-24 NOTE — Progress Notes (Signed)
Samantha Robertson 50 y.o.   Chief Complaint  Patient presents with  . Referral    Follow up referral Otolaryngologist    HISTORY OF PRESENT ILLNESS: This is a 50 y.o. female here for follow-up of hearing loss.  Seen by me on 03/20/2020 and referred to ENT. Was seen and evaluated by Dr. Lucia Gaskins who diagnosed her with asymmetric sensory hearing loss. Brain MRI within normal limits. Needs referral to audiologist. Also complaining of situational depression for the past 3 to 4 weeks.  Mostly related to personal relationship with her husband of 20 years.  They have 3 children.  Requesting medication. Fully vaccinated against COVID with a booster. Needs referral for colonoscopy. No other complaints or medical concerns.  HPI   Prior to Admission medications   Medication Sig Start Date End Date Taking? Authorizing Provider  tamsulosin (FLOMAX) 0.4 MG CAPS capsule Take 1 capsule (0.4 mg total) by mouth daily after supper. Patient not taking: Reported on 06/24/2020 12/11/19   Jacelyn Pi, Lilia Argue, MD    No Known Allergies  Patient Active Problem List   Diagnosis Date Noted  . Kidney stone on left side 12/11/2019  . Osteoarthritis of left foot 01/21/2017  . Pain of both heels 01/01/2017  . Abdominal pain, chronic, epigastric 05/07/2014  . Varicose veins of lower extremities with other complications 29/93/7169  . Vitamin D insufficiency 01/22/2012  . ONYCHOMYCOSIS, TOENAILS 12/06/2009  . DERMATOPHYTOSIS OF FOOT 12/06/2009  . EPIGASTRIC PAIN 03/09/2008  . CHEST PAIN 07/11/2007  . Prediabetes 07/11/2007    Past Medical History:  Diagnosis Date  . Depression   . Duodenal ulcer   . Prediabetes   . Vitamin D deficiency     Past Surgical History:  Procedure Laterality Date  . CESAREAN SECTION  06/29/06    REPEAT C/S W BTSP  . TUBAL LIGATION  06-29-06   AT TIME OF REPEAT C/S    Social History   Socioeconomic History  . Marital status: Married    Spouse name: Not on file  .  Number of children: 3  . Years of education: Not on file  . Highest education level: Not on file  Occupational History  . Occupation: Oncologist  Tobacco Use  . Smoking status: Never Smoker  . Smokeless tobacco: Never Used  Vaping Use  . Vaping Use: Never used  Substance and Sexual Activity  . Alcohol use: No    Alcohol/week: 0.0 standard drinks  . Drug use: No  . Sexual activity: Yes    Partners: Male    Birth control/protection: Surgical    Comment: 1st intercourse- 77, partners- 1, married- 33 yrs   Other Topics Concern  . Not on file  Social History Narrative  . Not on file   Social Determinants of Health   Financial Resource Strain: Not on file  Food Insecurity: Not on file  Transportation Needs: Not on file  Physical Activity: Not on file  Stress: Not on file  Social Connections: Not on file  Intimate Partner Violence: Not on file    Family History  Problem Relation Age of Onset  . Other Maternal Aunt        Cancer in blood  . Kidney disease Father   . Kidney disease Sister   . Colon cancer Neg Hx   . Colon polyps Neg Hx   . Diabetes Neg Hx   . Esophageal cancer Neg Hx   . Gallbladder disease Neg Hx   . Heart disease Neg Hx   .  Breast cancer Neg Hx      Review of Systems  Constitutional: Negative.  Negative for chills and fever.  HENT: Positive for hearing loss. Negative for congestion and sore throat.   Respiratory: Negative.  Negative for cough and shortness of breath.   Cardiovascular: Negative.  Negative for chest pain and palpitations.  Gastrointestinal: Negative.  Negative for abdominal pain, diarrhea, nausea and vomiting.  Genitourinary: Negative.   Musculoskeletal: Negative for myalgias.  Skin: Negative.  Negative for rash.  Neurological: Negative.  Negative for dizziness and headaches.  All other systems reviewed and are negative.  Today's Vitals   06/24/20 1348  BP: (!) 96/59  Pulse: 64  Resp: 16  Temp: 98.3 F (36.8 C)   TempSrc: Temporal  SpO2: 97%  Weight: 119 lb (54 kg)  Height: 5\' 1"  (1.549 m)   Body mass index is 22.48 kg/m.   Physical Exam Vitals reviewed.  Constitutional:      Appearance: Normal appearance.  HENT:     Head: Normocephalic.  Eyes:     Extraocular Movements: Extraocular movements intact.     Conjunctiva/sclera: Conjunctivae normal.     Pupils: Pupils are equal, round, and reactive to light.  Cardiovascular:     Rate and Rhythm: Normal rate and regular rhythm.     Pulses: Normal pulses.     Heart sounds: Normal heart sounds.  Pulmonary:     Effort: Pulmonary effort is normal.     Breath sounds: Normal breath sounds.  Musculoskeletal:        General: Normal range of motion.     Cervical back: Normal range of motion and neck supple.  Skin:    General: Skin is warm and dry.  Neurological:     General: No focal deficit present.     Mental Status: She is alert and oriented to person, place, and time.  Psychiatric:        Mood and Affect: Mood normal.        Behavior: Behavior normal.      ASSESSMENT & PLAN: Samantha Robertson was seen today for referral.  Diagnoses and all orders for this visit:  Asymmetrical sensorineural hearing loss -     Ambulatory referral to Audiology  Situational depression -     escitalopram (LEXAPRO) 10 MG tablet; Take 1 tablet (10 mg total) by mouth daily.  Colon cancer screening -     Ambulatory referral to Gastroenterology    Patient Instructions       If you have lab work done today you will be contacted with your lab results within the next 2 weeks.  If you have not heard from [place] then please contact us. The fastest way to get your results is to register for My Chart.   IF you received an x-ray today, you will receive an invoice from [facility]. Please contact [facility] at [phone] with questions or concerns regarding your invoice.   IF you received labwork today, you will receive an invoice from  [place]. Please contact [facility] at [phone] with questions or concerns regarding your invoice.   Our billing staff will not be able to assist you with questions regarding bills from these companies.  You will be contacted with the lab results as soon as they are available. The fastest way to get your results is to activate your My Chart account. Instructions are located on the last page of this paperwork. If you have not heard from [place] regarding the results in 2 weeks, please  contact this office.     Prdida auditiva Hearing Loss La prdida auditiva es la prdida total o parcial de la capacidad de or. Puede ser transitoria o Radene Ou, y ocurrir en uno o en ambos odos. Se necesita atencin mdica para tratar correctamente la prdida [place] y para evitar que la afeccin empeore. Es posible recuperar la audicin parcial o totalmente, en funcin de la causa de la prdida [place] y de su gravedad. En algunos casos la prdida auditiva es permanente. Cules son las causas? Las causas frecuentes de la prdida auditiva incluyen lo siguiente:  Exceso de cerumen en el conducto auditivo externo.  Infeccin del conducto auditivo externo o del odo medio.  Lquido en el odo medio.  Lesin en el odo o en la zona alrededor del odo.  Un objeto atascado en el odo.  Antecedentes de exposicin prolongada a ruidos fuertes, Programmer, systems. Las causas menos frecuentes de la prdida auditiva incluyen lo siguiente:  Tumores en el odo.  Infecciones bacterianas o virales, como meningitis.  Un orificio en la membrana del tmpano (membrana del tmpano perforada).  Problemas en el nervio auditivo que enva las seales entre el cerebro y el odo.  Ciertos medicamentos. Cules son los signos o los sntomas? Los sntomas de esta afeccin pueden incluir los siguientes:  Dificultad para Product manager diferencia entre los sonidos.  Dificultad para seguir una conversacin cuando hay ruido  ambiental.  Ausencia de respuesta a los ruidos del entorno. Esto puede ser ms notorio cuando no hay respuesta a los ruidos inesperados.  Necesidad de subir el volumen del Development worker, community, la radio u otros dispositivos.  Pitidos en el odo.  Mareos. Cmo se diagnostica? Esta afeccin se diagnostica en funcin de lo siguiente:  Un examen fsico.  Una prueba de audicin Lorel Monaco). Un especialista en audicin (audilogo) realizar la [place]. Es posible que lo deriven a un especialista en garganta, Doran Durand y odo (otorrinolaringlogo).   Cmo se trata? El tratamiento para la prdida de la audicin incluye lo siguiente:  Extraccin del cerumen.  Medicamentos para tratar o prevenir una infeccin (antibiticos).  Medicamentos para reducir la inflamacin (corticoesteroides).  Audfonos para la prdida [place] relacionada con el dao nervioso. Siga estas instrucciones en su casa:  Si le recetaron un antibitico, tmelo como se lo haya indicado el mdico. No deje de tomar los antibiticos aunque comience a Sports administrator.  Tome los medicamentos de venta libre y los recetados solamente como se lo haya indicado el mdico.  Evite los ruidos fuertes.  Retome sus actividades normales segn lo indicado por el mdico. Pregntele al mdico qu actividades son seguras para usted.  Concurra a todas las visitas de seguimiento como se lo haya indicado el mdico. Esto es importante. Comunquese con un mdico si:  Siente mareos.  Tiene nuevos sntomas.  Vomita o siente nuseas.  Tiene fiebre. Solicite ayuda inmediatamente si:  Percibe cambios repentinos en la visin.  Tiene dolor intenso de odos.  Aumenta su fatiga o debilidad.  Tiene un dolor de cabeza intenso. Resumen  La prdida [place] es una disminucin de la capacidad de or sonidos a su alrededor. Puede ser transitoria o permanente.  El tratamiento depender de la causa de la prdida [place]. Puede incluir la  extraccin de cerumen, medicamentos o un audfono.  Es posible recuperar la audicin parcial o totalmente, en funcin de la causa de la prdida [place] y de su gravedad.  Concurra a todas las visitas de seguimiento como se lo haya indicado el mdico. Esto  es importante. Esta informacin no tiene Marine scientist el consejo del mdico. Asegrese de hacerle al mdico cualquier pregunta que tenga. Document Revised: 04/14/2018 Document Reviewed: 04/14/2018 Elsevier Patient Education  2021 Elsevier Inc.      Agustina Caroli, MD [facility]

## 2020-06-24 NOTE — Patient Instructions (Addendum)
If you have lab work done today you will be contacted with your lab results within the next 2 weeks.  If you have not heard from [place] then please contact us. The fastest way to get your results is to register for My Chart.   IF you received an x-ray today, you will receive an invoice from [facility] Radiology. Please contact [facility] Radiology at [phone] with questions or concerns regarding your invoice.   IF you received labwork today, you will receive an invoice from [place]. Please contact [facility] at [phone] with questions or concerns regarding your invoice.   Our billing staff will not be able to assist you with questions regarding bills from these companies.  You will be contacted with the lab results as soon as they are available. The fastest way to get your results is to activate your My Chart account. Instructions are located on the last page of this paperwork. If you have not heard from [place] regarding the results in 2 weeks, please contact this office.     Prdida auditiva Hearing Loss La prdida auditiva es la prdida total o parcial de la capacidad de or. Puede ser transitoria o Radene Ou, y ocurrir en uno o en ambos odos. Se necesita atencin mdica para tratar correctamente la prdida [place] y para evitar que la afeccin empeore. Es posible recuperar la audicin parcial o totalmente, en funcin de la causa de la prdida [place] y de su gravedad. En algunos casos la prdida auditiva es permanente. Cules son las causas? Las causas frecuentes de la prdida auditiva incluyen lo siguiente:  Exceso de cerumen en el conducto auditivo externo.  Infeccin del conducto auditivo externo o del odo medio.  Lquido en el odo medio.  Lesin en el odo o en la zona alrededor del odo.  Un objeto atascado en el odo.  Antecedentes de exposicin prolongada a ruidos fuertes, Programmer, systems. Las causas menos frecuentes de la prdida auditiva incluyen lo  siguiente:  Tumores en el odo.  Infecciones bacterianas o virales, como meningitis.  Un orificio en la membrana del tmpano (membrana del tmpano perforada).  Problemas en el nervio auditivo que enva las seales entre el cerebro y el odo.  Ciertos medicamentos. Cules son los signos o los sntomas? Los sntomas de esta afeccin pueden incluir los siguientes:  Dificultad para Product manager diferencia entre los sonidos.  Dificultad para seguir una conversacin cuando hay ruido ambiental.  Ausencia de respuesta a los ruidos del entorno. Esto puede ser ms notorio cuando no hay respuesta a los ruidos inesperados.  Necesidad de subir el volumen del Development worker, community, la radio u otros dispositivos.  Pitidos en el odo.  Mareos. Cmo se diagnostica? Esta afeccin se diagnostica en funcin de lo siguiente:  Un examen fsico.  Una prueba de audicin Lorel Monaco). Un especialista en audicin (audilogo) realizar la [place]. Es posible que lo deriven a un especialista en garganta, Doran Durand y odo (otorrinolaringlogo).   Cmo se trata? El tratamiento para la prdida de la audicin incluye lo siguiente:  Extraccin del cerumen.  Medicamentos para tratar o prevenir una infeccin (antibiticos).  Medicamentos para reducir la inflamacin (corticoesteroides).  Audfonos para la prdida [place] relacionada con el dao nervioso. Siga estas instrucciones en su casa:  Si le recetaron un antibitico, tmelo como se lo haya indicado el mdico. No deje de tomar los antibiticos aunque comience a Sports administrator.  Tome los medicamentos de venta libre y los recetados solamente como se lo haya indicado el mdico.  Evite los ruidos fuertes.  Retome sus actividades normales segn lo indicado por el mdico. Pregntele al mdico qu actividades son seguras para usted.  Concurra a todas las visitas de seguimiento como se lo haya indicado el mdico. Esto es importante. Comunquese con un mdico  si:  Siente mareos.  Tiene nuevos sntomas.  Vomita o siente nuseas.  Tiene fiebre. Solicite ayuda inmediatamente si:  Percibe cambios repentinos en la visin.  Tiene dolor intenso de odos.  Aumenta su fatiga o debilidad.  Tiene un dolor de cabeza intenso. Resumen  La prdida [place] es una disminucin de la capacidad de or sonidos a su alrededor. Puede ser transitoria o permanente.  El tratamiento depender de la causa de la prdida [place]. Puede incluir la extraccin de cerumen, medicamentos o un audfono.  Es posible recuperar la audicin parcial o totalmente, en funcin de la causa de la prdida [place] y de su gravedad.  Concurra a todas las visitas de seguimiento como se lo haya indicado el mdico. Esto es importante. Esta informacin no tiene Marine scientist el consejo del mdico. Asegrese de hacerle al mdico cualquier pregunta que tenga. Document Revised: 04/14/2018 Document Reviewed: 04/14/2018 Elsevier Patient Education  2021 [employer].

## 2020-07-22 ENCOUNTER — Ambulatory Visit: Payer: Commercial Managed Care - PPO | Attending: Emergency Medicine | Admitting: Audiologist

## 2020-07-22 ENCOUNTER — Other Ambulatory Visit: Payer: Self-pay

## 2020-07-22 DIAGNOSIS — H9313 Tinnitus, bilateral: Secondary | ICD-10-CM | POA: Insufficient documentation

## 2020-07-22 DIAGNOSIS — H9202 Otalgia, left ear: Secondary | ICD-10-CM | POA: Diagnosis present

## 2020-07-22 DIAGNOSIS — H902 Conductive hearing loss, unspecified: Secondary | ICD-10-CM | POA: Diagnosis not present

## 2020-07-23 NOTE — Procedures (Signed)
Outpatient Audiology and [place], [place]  [address] [phone]  AUDIOLOGICAL  EVALUATION  NAME: Samantha Robertson     DOB:   May 27, 1971      MRN: [id number]                                                                                     DATE: 07/23/2020     REFERENT: Horald Pollen, MD STATUS: Outpatient DIAGNOSIS:   1) Moderately Severe Asymmetric Conductive Hearing loss Left ear with restricted hearing on contralateral sie.  2) Mild Sensorineural  Hearing Loss of Right ear with restricted hearing on contralateral side.  3) Bilateral Tinnitus  4) Ear Pain    History: Samantha Robertson is receiving a hearing evaluation due to concerns for asymmetic hearing loss and buzzing tinnitus in the right ear. Samantha Robertson's main concern is the tinnitus in her right ear, she described the sound like the humming of a car. She also has ringing in the left ear but it is not as bothersome. A year ago she had a sudden change in hearing in the left ear. She now has pain with loud sound in the left ear. Samantha Robertson saw Dr. Lucia Gaskins who recommended an MRI, which was insignificant. Edwina told this provider that she was told hearing aids would not help her left ear.  Samantha Robertson has difficulty hearing in most environments now. No other relevant case history reported. Samantha Robertson was confused on why she was receiving another hearing test. She said she just had a hearing test in November. Medical records found a copy of the previous audiogram. This exam showed a mild conductive hearing loss in the right ear with a low pitched sensorineural hearing loss in the left ear. The note said hearing aids were recommended after Yuka receives the MRI. No other information from previous visit found.   Evaluation:   Otoscopy showed a clear view of the tympanic membranes, bilaterally  Tympanometry results were consistent with normal middle ear function, bilaterally     Audiometric testing was completed using conventional audiometry with insert transducer. Speech Recognition Thresholds were consistent with pure tone averages. Word Recognition was excellent at an elevated level using a Spanish list. Pure tone thresholds show normal to mild sensorineural hearing loss the right ear, and a moderately severe rising to mild conductive hearing loss in the left ear. This is inconsistent with previous testing.   Results:  The test results were reviewed with Samantha Robertson. Her main concern is the buzzing in her right ear and wanted a prescription for this. Patient informed that audiologists cannot prescribe medication, and there is currently no cure for tinnitus. The buzzing she is hearing is called tinnitus, and the best way to address this is a rich sound environment and hearing aids when appropriate. Hearing aids can help decrease the awareness of tinnitus giving her access to more sound in her environment. She is hesitant to try hearing aids due to some sound sensitivity. She said the word recognition performed in the left ear was surprisingly clear, but not comfortable. I recommended she return to Dr. Lucia Gaskins and [facility] to at least trial hearing aids. They can be slowly turned  up over time to help her acclimate. Her hearing test results today are also significantly different than her test in November. Her last test showed sensorineural loss in the left ear and today's results are conductive. The type and degree of hearing loss needs to be confirmed before being fit with aids. Due to her excellent word recognition in the left ear she will likely understand speech well in the left ear when properly fit. She reported understanding through the interpretor. Patient asked that I call and schedule the appointment with [facility].   Recommendations: 1. Amplification is necessary for both ears. Hearing aids can be purchased from a variety of locations. See provided list for  locations in the [place] area, Samantha Robertson is a patient of Dr. Lucia Gaskins and [facility]. She was recommended to return there for a hearing aid consult.  2. Recommend another assessment of Samantha Robertson's hearing due to significant difference in results obtained today versus in November.    Alfonse Alpers  Audiologist, Au.D., CCC-A 07/23/2020  7:28 AM  Cc: Horald Pollen, MD

## 2020-09-11 ENCOUNTER — Encounter: Payer: Self-pay | Admitting: Obstetrics & Gynecology

## 2020-09-11 ENCOUNTER — Ambulatory Visit (INDEPENDENT_AMBULATORY_CARE_PROVIDER_SITE_OTHER): Payer: Commercial Managed Care - PPO | Admitting: Obstetrics & Gynecology

## 2020-09-11 ENCOUNTER — Other Ambulatory Visit: Payer: Self-pay

## 2020-09-11 VITALS — BP 114/72 | Ht 60.0 in | Wt 116.8 lb

## 2020-09-11 DIAGNOSIS — Z01419 Encounter for gynecological examination (general) (routine) without abnormal findings: Secondary | ICD-10-CM | POA: Diagnosis not present

## 2020-09-11 DIAGNOSIS — Z780 Asymptomatic menopausal state: Secondary | ICD-10-CM | POA: Diagnosis not present

## 2020-09-11 DIAGNOSIS — R102 Pelvic and perineal pain: Secondary | ICD-10-CM

## 2020-09-11 DIAGNOSIS — R87610 Atypical squamous cells of undetermined significance on cytologic smear of cervix (ASC-US): Secondary | ICD-10-CM | POA: Diagnosis not present

## 2020-09-11 NOTE — Progress Notes (Signed)
Samantha Robertson 01/14/1971 [id number]   History:    50 y.o. G3P3L3 Married.Status post tubal ligation.  YQ:[id number] presenting for annual gyn exam   HPI: Postmenopause.  No PMB. Mild pelvic pain intermittently. Occasional mild hot flushes and night sweats. No pain with IC. Urine normal. Tendency for constipation. Breast normal. Body mass index 22.81. Needs to exercise more. Fasting health labs with Fam MD.  Past medical history,surgical history, family history and social history were all reviewed and documented in the EPIC chart.  Obstetric History OB History  Gravida Para Term Preterm AB Living  3 3 2 1   3   SAB IAB Ectopic Multiple Live Births          3    # Outcome Date GA Lbr Len/2nd Weight Sex Delivery Anes PTL Lv  3 Term     F CS-Unspec  N LIV  2 Term     F CS-Unspec  N LIV  1 Preterm     M Vag-Spont  Y LIV     ROS: A ROS was performed and pertinent positives and negatives are included in the history. GENERAL: No fevers or chills. HEENT: No change in vision, no earache, sore throat or sinus congestion. NECK: No pain or stiffness. CARDIOVASCULAR: No chest pain or pressure. No palpitations. PULMONARY: No shortness of breath, cough or wheeze. GASTROINTESTINAL: No abdominal pain, nausea, vomiting or diarrhea, melena or bright red blood per rectum. GENITOURINARY: No urinary frequency, urgency, hesitancy or dysuria. MUSCULOSKELETAL: No joint or muscle pain, no back pain, no recent trauma. DERMATOLOGIC: No rash, no itching, no lesions. ENDOCRINE: No polyuria, polydipsia, no heat or cold intolerance. No recent change in weight. HEMATOLOGICAL: No anemia or easy bruising or bleeding. NEUROLOGIC: No headache, seizures, numbness, tingling or weakness. PSYCHIATRIC: No depression, no loss of interest in normal activity or change in sleep pattern.     Exam:   BP 114/72   Ht 5' (1.524 m)   Wt 116 lb 12.8 oz (53 kg)   BMI 22.81 kg/m   Body mass index is  22.81 kg/m.  General appearance : Well developed well nourished female. No acute distress HEENT: Eyes: no retinal hemorrhage or exudates,  Neck supple, trachea midline, no carotid bruits, no thyroidmegaly Lungs: Clear to auscultation, no rhonchi or wheezes, or rib retractions  Heart: Regular rate and rhythm, no murmurs or gallops Breast:Examined in sitting and supine position were symmetrical in appearance, no palpable masses or tenderness,  no skin retraction, no nipple inversion, no nipple discharge, no skin discoloration, no axillary or supraclavicular lymphadenopathy Abdomen: no palpable masses or tenderness, no rebound or guarding Extremities: no edema or skin discoloration or tenderness  Pelvic: Vulva: Normal             Vagina: No gross lesions or discharge  Cervix: No gross lesions or discharge.  Pap reflex done.  Uterus  AV, normal size, shape and consistency, non-tender and mobile  Adnexa  Without masses or tenderness  Anus: Normal   Assessment/Plan:  50 y.o. female for annual exam   1. Encounter for routine gynecological examination with Papanicolaou smear of cervix Normal gynecologic exam.  Pap reflex done.  Breast exam normal.Screening mammogram March 2021 was negative, will schedule a mammogram now.  Will refer to gastro for a colonoscopy.  Good body mass index at 22.81.  Continue with fitness and healthy nutrition.  Health labs with family physician.  2. ASCUS of cervix with negative high risk HPV Pap reflex done today.  3. Postmenopause Well on no hormone replacement therapy.  No postmenopausal bleeding.  Recommend vitamin D supplements, calcium intake of 1.5 g/day total and regular weightbearing physical activities.  4. Pelvic pain in female Mild intermittent pelvic pain.  F/U Pelvic US for further investigation. - US Transvaginal Non-OB; Future  Princess Bruins MD, 3:43 PM 09/11/2020

## 2020-09-13 ENCOUNTER — Encounter: Payer: Self-pay | Admitting: Obstetrics & Gynecology

## 2020-09-16 LAB — PAP IG W/ RFLX HPV ASCU

## 2020-09-23 ENCOUNTER — Ambulatory Visit: Payer: Self-pay | Admitting: Emergency Medicine

## 2020-09-23 ENCOUNTER — Encounter: Payer: Self-pay | Admitting: Emergency Medicine

## 2020-10-01 ENCOUNTER — Other Ambulatory Visit: Payer: Self-pay

## 2020-10-01 ENCOUNTER — Ambulatory Visit (INDEPENDENT_AMBULATORY_CARE_PROVIDER_SITE_OTHER): Payer: Commercial Managed Care - PPO | Admitting: Emergency Medicine

## 2020-10-01 ENCOUNTER — Encounter: Payer: Self-pay | Admitting: Emergency Medicine

## 2020-10-01 VITALS — BP 100/60 | HR 85 | Temp 98.4°F | Ht 61.0 in | Wt 114.0 lb

## 2020-10-01 DIAGNOSIS — H903 Sensorineural hearing loss, bilateral: Secondary | ICD-10-CM

## 2020-10-01 NOTE — Patient Instructions (Signed)
Mantenimiento de Technical sales engineer en [place] Maintenance, Female Adoptar un estilo de vida saludable y recibir atencin preventiva son importantes para promover la salud y Musician. Consulte al mdico sobre:  El esquema adecuado para hacerse pruebas y exmenes peridicos.  Cosas que puede hacer por su cuenta para prevenir enfermedades y [employer]. Qu debo saber sobre la dieta, el peso y el ejercicio? Consuma una dieta saludable  Consuma una dieta que incluya muchas verduras, frutas, productos lcteos con bajo contenido de [place] y Advertising account planner.  No consuma muchos alimentos ricos en grasas slidas, azcares agregados o sodio.   Mantenga un peso saludable El ndice de masa muscular [facility]) se [place] para identificar problemas de [place]. Proporciona una estimacin de la grasa corporal basndose en el peso y la altura. Su mdico puede ayudarle a Radiation protection practitioner [place] y a Scientist, forensic o Theatre manager un peso saludable. Haga ejercicio con regularidad Haga ejercicio con regularidad. Esta es una de las prcticas ms importantes que puede hacer por su salud. La mayora de los adultos deben seguir estas pautas:  Optometrist, al menos, 133minutos de actividad fsica por semana. El ejercicio debe aumentar la frecuencia cardaca y Nature conservation officer transpirar (ejercicio de intensidad moderada).  Hacer ejercicios de fortalecimiento por lo [employer] por semana. Agregue esto a su plan de ejercicio de intensidad moderada.  Pasar menos tiempo sentados. Incluso la actividad fsica ligera puede ser beneficiosa. Controle sus niveles de colesterol y lpidos en la sangre Comience a realizarse anlisis de lpidos y Research officer, trade union en la sangre a los 20aos y luego reptalos cada 5aos. Hgase controlar los niveles de colesterol con mayor frecuencia si:  Sus niveles de lpidos y colesterol son altos.  Es mayor de 40aos.  Presenta un alto riesgo de padecer enfermedades cardacas. Qu debo saber sobre las pruebas de  deteccin del cncer? Segn su historia clnica y sus antecedentes familiares, es posible que deba realizarse pruebas de deteccin del cncer en diferentes edades. Esto puede incluir pruebas de deteccin de lo siguiente:  Cncer de mama.  Cncer de cuello uterino.  Cncer colorrectal.  Cncer de piel.  Cncer de pulmn. Qu debo saber sobre la enfermedad cardaca, la diabetes y la hipertensin arterial? Presin arterial y enfermedad cardaca  La hipertensin arterial causa enfermedades cardacas y [place] el riesgo de accidente cerebrovascular. Es ms probable que esto se manifieste en las personas que tienen lecturas de presin arterial alta, tienen ascendencia africana o tienen sobrepeso.  Hgase controlar la presin arterial: ? Cada 3 a 5 aos si tiene entre 18 y 63 aos. ? Todos los aos si es mayor de [place]. Diabetes Realcese exmenes de deteccin de la diabetes con regularidad. Este anlisis revisa el nivel de azcar en la sangre en [place]. Hgase las pruebas de deteccin:  Cada tresaos despus de los 52aos de edad si tiene un peso normal y un bajo riesgo de padecer diabetes.  Con ms frecuencia y a partir de [place] edad inferior si tiene sobrepeso o un alto riesgo de padecer diabetes. Qu debo saber sobre la prevencin de infecciones? Hepatitis B Si tiene un riesgo ms alto de contraer hepatitis B, debe someterse a un examen de deteccin de este virus. Hable con el mdico para averiguar si tiene riesgo de contraer la infeccin por hepatitis B. Hepatitis C Se recomienda el anlisis a:  [employer] 1945 y 1965.  Todas las personas que tengan un riesgo de haber contrado hepatitis C. Enfermedades de transmisin sexual (ETS)  Hgase  las pruebas de deteccin de ITS, incluidas la gonorrea y la clamidia, si: ? Es sexualmente activa y es menor de [place]. ? Es mayor de 24aos, y Investment banker, operational informa que corre riesgo de tener este tipo de  infecciones. ? La actividad sexual ha cambiado desde que le hicieron la ltima prueba de deteccin y tiene un riesgo mayor de Best boy clamidia o Radio broadcast assistant. Pregntele al mdico si usted tiene riesgo.  Pregntele al mdico si usted tiene un alto riesgo de Museum/gallery curator VIH. El mdico tambin puede recomendarle un medicamento recetado para ayudar a evitar la infeccin por el VIH. Si elige tomar medicamentos para prevenir el VIH, primero debe Pilgr[employer] Pride de deteccin del VIH. Luego debe hacerse anlisis cada 6meses mientras est tomando los medicamentos. Embarazo  Si est por dejar de Librarian, academic (fase premenopusica) y usted puede quedar [place], busque asesoramiento antes de [place].  Tome de 400 a [id number] (mcg) de cido [employer] si [place].  Pida mtodos de control de la natalidad (anticonceptivos) si desea evitar un embarazo no deseado. Osteoporosis y [place] La osteoporosis es una enfermedad en la que los huesos pierden los minerales y la fuerza por el avance de la edad. El resultado pueden ser fracturas en los [place]. Si tiene 65aos o ms, o si est en riesgo de sufrir osteoporosis y fracturas, pregunte a su mdico si debe:  Hacerse pruebas de deteccin de prdida sea.  Tomar un suplemento de calcio o de vitamina D para reducir el riesgo de fracturas.  Recibir terapia de reemplazo hormonal (TRH) para tratar los sntomas de la menopausia. Siga estas instrucciones en su casa: Estilo de vida  No consuma ningn producto que contenga nicotina o tabaco, como cigarrillos, cigarrillos electrnicos y tabaco de Higher education careers adviser. Si necesita ayuda para dejar de fumar, consulte al mdico.  No consuma drogas.  No comparta agujas.  Solicite ayuda a su mdico si necesita apoyo o informacin para abandonar las drogas. Consumo de alcohol  No beba alcohol si: ? Su mdico le indica no hacerlo. ? Est embarazada, puede estar embarazada o est tratando de quedar  embarazada.  Si bebe alcohol: ? Limite la cantidad que consume de 0 a 1 medida por da. ? Limite la ingesta si est amamantando.  Est atento a la cantidad de alcohol que hay en las bebidas que toma. En los [place], una medida equivale a una botella de cerveza de 12oz (362ml), un vaso de vino de 5oz (175ml) o un vaso de una bebida alcohlica de alta graduacin de 1oz (56ml). Instrucciones generales  Realcese los estudios de rutina de la salud, dentales y de Public librarian.  [place].  Infrmele a su mdico si: ? Se siente deprimida con frecuencia. ? Alguna vez ha sido vctima de [place] o no se siente segura en su casa. Resumen  Adoptar un estilo de vida saludable y recibir atencin preventiva son importantes para promover la salud y Musician.  Siga las instrucciones del mdico acerca de una dieta saludable, el ejercicio y la realizacin de pruebas o exmenes para Engineer, building services.  Siga las instrucciones del mdico con respecto al control del colesterol y la presin arterial. Esta informacin no tiene Marine scientist el consejo del mdico. Asegrese de hacerle al mdico cualquier pregunta que tenga. Document Revised: 06/22/2018 Document Reviewed: 06/22/2018 Elsevier Patient Education  [place].

## 2020-10-01 NOTE — Progress Notes (Signed)
Samantha Robertson 50 y.o.   Chief Complaint  Patient presents with  . Ear Problem    Per patient both ears, not able to hear for a year  History: Samantha Robertson is receiving a hearing evaluation due to concerns for asymmetic hearing loss and buzzing tinnitus in the right ear. Samantha Robertson's main concern is the tinnitus in her right ear, she described the sound like the humming of a car. She also has ringing in the left ear but it is not as bothersome. A year ago she had a sudden change in hearing in the left ear. She now has pain with loud sound in the left ear. [name] saw Dr. Lucia Gaskins who recommended an MRI, which was insignificant. [name] told this provider that she was told hearing aids would not help her left ear.  Samantha Robertson has difficulty hearing in most environments now. No other relevant case history reported. Samantha Robertson was confused on why she was receiving another hearing test. She said she just had a hearing test in November. Medical records found a copy of the previous audiogram. This exam showed a mild conductive hearing loss in the right ear with a low pitched sensorineural hearing loss in the left ear. The note said hearing aids were recommended after Samantha Robertson receives the MRI. No other information from previous visit found.   Evaluation:  Otoscopy showed a clear view of the tympanic membranes, bilaterally  Tympanometry results were consistent with normal middle ear function, bilaterally    Audiometric testing was completed using conventional audiometry with insert transducer. Speech Recognition Thresholds were consistent with pure tone averages. Word Recognition was excellent at an elevated level using a Spanish list. Pure tone thresholds show normal to mild sensorineural hearing loss the right ear, and a moderately severe rising to mild conductive hearing loss in the left ear. This is inconsistent with previous testing.   Results:  The test results were reviewed with  Samantha Robertson. Her main concern is the buzzing in her right ear and wanted a prescription for this. Patient informed that audiologists cannot prescribe medication, and there is currently no cure for tinnitus. The buzzing she is hearing is called tinnitus, and the best way to address this is a rich sound environment and hearing aids when appropriate. Hearing aids can help decrease the awareness of tinnitus giving her access to more sound in her environment. She is hesitant to try hearing aids due to some sound sensitivity. She said the word recognition performed in the left ear was surprisingly clear, but not comfortable. I recommended she return to Dr. Lucia Gaskins and [facility] to at least trial hearing aids. They can be slowly turned up over time to help her acclimate. Her hearing test results today are also significantly different than her test in November. Her last test showed sensorineural loss in the left ear and today's results are conductive. The type and degree of hearing loss needs to be confirmed before being fit with aids. Due to her excellent word recognition in the left ear she will likely understand speech well in the left ear when properly fit. She reported understanding through the interpretor. Patient asked that I call and schedule the appointment with [facility].   Recommendations: 1. Amplification is necessary for both ears. Hearing aids can be purchased from a variety of locations. See provided list for locations in the [place] area, Samantha Robertson is a patient of Dr. Lucia Gaskins and [facility]. She was recommended to return there for a hearing aid consult.  2. Recommend another assessment of  Samantha Robertson's hearing due to significant difference in results obtained today versus in November.   Alfonse Alpers  Audiologist, Au.D., CCC-A 07/23/2020  7:28 AM   HISTORY OF PRESENT ILLNESS: This is a 50 y.o. female recently diagnosed with bilateral sensorineural hearing loss and recommended to get hearing  aids. Was seen initially by ENT Dr. Lucia Gaskins.  Referral to audiologist went to [facility].  Patient was seen and evaluated but hearing aids still not available.  Audiologist's office visit note reviewed. During our last visit patient was also diagnosed with situational depression and anxiety.  Was prescribed 10 mg of Lexapro but she did not like side effects.  Stopped after 1 week. Depression screen [facility] 2/9 10/01/2020 06/24/2020 03/20/2020 12/11/2019 06/22/2019  Decreased Interest 0 2 0 0 0  Down, Depressed, Hopeless 0 1 0 0 0  PHQ - 2 Score 0 3 0 0 0  Altered sleeping - 0 - - -  Tired, decreased energy - 0 - - -  Change in appetite - 0 - - -  Feeling bad or failure about yourself  - 0 - - -  Trouble concentrating - 0 - - -  Moving slowly or fidgety/restless - 0 - - -  Suicidal thoughts - 0 - - -  PHQ-9 Score - 3 - - -  Difficult doing work/chores - - - - -     HPI   Prior to Admission medications   Not on File    No Known Allergies  Patient Active Problem List   Diagnosis Date Noted  . Kidney stone on left side 12/11/2019  . Osteoarthritis of left foot 01/21/2017  . Pain of both heels 01/01/2017  . Abdominal pain, chronic, epigastric 05/07/2014  . Varicose veins of lower extremities with other complications 42/87/6811  . Vitamin D insufficiency 01/22/2012  . ONYCHOMYCOSIS, TOENAILS 12/06/2009  . DERMATOPHYTOSIS OF FOOT 12/06/2009  . EPIGASTRIC PAIN 03/09/2008  . CHEST PAIN 07/11/2007  . Prediabetes 07/11/2007    Past Medical History:  Diagnosis Date  . Depression   . Duodenal ulcer   . Prediabetes   . Vitamin D deficiency     Past Surgical History:  Procedure Laterality Date  . CESAREAN SECTION  06/29/06    REPEAT C/S W BTSP  . TUBAL LIGATION  06-29-06   AT TIME OF REPEAT C/S    Social History   Socioeconomic History  . Marital status: Married    Spouse name: Not on file  . Number of children: 3  . Years of education: Not on file  . Highest education level:  Not on file  Occupational History  . Occupation: Oncologist  Tobacco Use  . Smoking status: Never Smoker  . Smokeless tobacco: Never Used  Vaping Use  . Vaping Use: Never used  Substance and Sexual Activity  . Alcohol use: No    Alcohol/week: 0.0 standard drinks  . Drug use: No  . Sexual activity: Yes    Partners: Male    Birth control/protection: Surgical    Comment: 1st intercourse- 47, partners- 16, married- 90 yrs   Other Topics Concern  . Not on file  Social History Narrative  . Not on file   Social Determinants of Health   Financial Resource Strain: Not on file  Food Insecurity: Not on file  Transportation Needs: Not on file  Physical Activity: Not on file  Stress: Not on file  Social Connections: Not on file  Intimate Partner Violence: Not on file  Family History  Problem Relation Age of Onset  . Other Maternal Aunt        Cancer in blood  . Kidney disease Father   . Kidney disease Sister   . Colon cancer Neg Hx   . Colon polyps Neg Hx   . Diabetes Neg Hx   . Esophageal cancer Neg Hx   . Gallbladder disease Neg Hx   . Heart disease Neg Hx   . Breast cancer Neg Hx      Review of Systems  Constitutional: Negative.  Negative for chills and fever.  HENT: Positive for hearing loss. Negative for congestion and sore throat.   Respiratory: Negative.  Negative for cough and shortness of breath.   Cardiovascular: Negative.  Negative for chest pain and palpitations.  Gastrointestinal: Negative for abdominal pain, diarrhea, nausea and vomiting.  Genitourinary: Negative.  Negative for dysuria and hematuria.  Skin: Negative.  Negative for rash.  Neurological: Negative.  Negative for dizziness and headaches.  All other systems reviewed and are negative.    Today's Vitals   10/01/20 1520  BP: 100/60  Pulse: 85  Temp: 98.4 F (36.9 C)  TempSrc: Oral  SpO2: 98%  Weight: 114 lb (51.7 kg)  Height: 5\' 1"  (1.549 m)   Body mass index is 21.54 kg/m. Wt  Readings from Last 3 Encounters:  10/01/20 114 lb (51.7 kg)  09/11/20 116 lb 12.8 oz (53 kg)  06/24/20 119 lb (54 kg)     Physical Exam Vitals reviewed.  Constitutional:      Appearance: Normal appearance.  HENT:     Head: Normocephalic.  Eyes:     Extraocular Movements: Extraocular movements intact.     Pupils: Pupils are equal, round, and reactive to light.  Cardiovascular:     Rate and Rhythm: Normal rate.  Pulmonary:     Effort: Pulmonary effort is normal.  Musculoskeletal:        General: Normal range of motion.     Cervical back: Normal range of motion.  Skin:    General: Skin is warm and dry.     Capillary Refill: Capillary refill takes less than 2 seconds.  Neurological:     General: No focal deficit present.     Mental Status: She is alert and oriented to person, place, and time.  Psychiatric:        Mood and Affect: Mood normal.        Behavior: Behavior normal.      ASSESSMENT & PLAN: Samantha Robertson was seen today for ear problem.  Diagnoses and all orders for this visit:  Bilateral sensorineural hearing loss -     Ambulatory referral to Audiology    Patient Instructions   Mantenimiento de la salud en [place] Maintenance, Female Adoptar un estilo de vida saludable y recibir atencin preventiva son importantes para promover la salud y Musician. Consulte al mdico sobre:  El esquema adecuado para hacerse pruebas y exmenes peridicos.  Cosas que puede hacer por su cuenta para prevenir enfermedades y [employer]. Qu debo saber sobre la dieta, el peso y el ejercicio? Consuma una dieta saludable  Consuma una dieta que incluya muchas verduras, frutas, productos lcteos con bajo contenido de [place] y Advertising account planner.  No consuma muchos alimentos ricos en grasas slidas, azcares agregados o sodio.   Mantenga un peso saludable El ndice de masa muscular [facility]) se [place] para identificar problemas de [place]. Proporciona una estimacin de  la grasa corporal basndose en el  peso y Agricultural consultant. Su mdico puede ayudarle a Radiation protection practitioner [place] y a Scientist, forensic o Theatre manager un peso saludable. Haga ejercicio con regularidad Haga ejercicio con regularidad. Esta es una de las prcticas ms importantes que puede hacer por su salud. La mayora de los adultos deben seguir estas pautas:  Optometrist, al menos, 120minutos de actividad fsica por semana. El ejercicio debe aumentar la frecuencia cardaca y Nature conservation officer transpirar (ejercicio de intensidad moderada).  Hacer ejercicios de fortalecimiento por lo [employer] por semana. Agregue esto a su plan de ejercicio de intensidad moderada.  Pasar menos tiempo sentados. Incluso la actividad fsica ligera puede ser beneficiosa. Controle sus niveles de colesterol y lpidos en la sangre Comience a realizarse anlisis de lpidos y Research officer, trade union en la sangre a los 20aos y luego reptalos cada 5aos. Hgase controlar los niveles de colesterol con mayor frecuencia si:  Sus niveles de lpidos y colesterol son altos.  Es mayor de 40aos.  Presenta un alto riesgo de padecer enfermedades cardacas. Qu debo saber sobre las pruebas de deteccin del cncer? Segn su historia clnica y sus antecedentes familiares, es posible que deba realizarse pruebas de deteccin del cncer en diferentes edades. Esto puede incluir pruebas de deteccin de lo siguiente:  Cncer de mama.  Cncer de cuello uterino.  Cncer colorrectal.  Cncer de piel.  Cncer de pulmn. Qu debo saber sobre la enfermedad cardaca, la diabetes y la hipertensin arterial? Presin arterial y enfermedad cardaca  La hipertensin arterial causa enfermedades cardacas y [place] el riesgo de accidente cerebrovascular. Es ms probable que esto se manifieste en las personas que tienen lecturas de presin arterial alta, tienen ascendencia africana o tienen sobrepeso.  Hgase controlar la presin arterial: ? Cada 3 a 5 aos si tiene entre 18 y 36  aos. ? Todos los aos si es mayor de [place]. Diabetes Realcese exmenes de deteccin de la diabetes con regularidad. Este anlisis revisa el nivel de azcar en la sangre en [place]. Hgase las pruebas de deteccin:  Cada tresaos despus de los 14aos de edad si tiene un peso normal y un bajo riesgo de padecer diabetes.  Con ms frecuencia y a partir de [place] edad inferior si tiene sobrepeso o un alto riesgo de padecer diabetes. Qu debo saber sobre la prevencin de infecciones? Hepatitis B Si tiene un riesgo ms alto de contraer hepatitis B, debe someterse a un examen de deteccin de este virus. Hable con el mdico para averiguar si tiene riesgo de contraer la infeccin por hepatitis B. Hepatitis C Se recomienda el anlisis a:  [employer] 1945 y 1965.  Todas las personas que tengan un riesgo de haber contrado hepatitis C. Enfermedades de transmisin sexual (ETS)  Hgase las pruebas de Programme researcher, broadcasting/film/video de ITS, incluidas la gonorrea y la clamidia, si: ? Es sexualmente activa y es menor de [place]. ? Es mayor de 24aos, y Investment banker, operational informa que corre riesgo de tener este tipo de infecciones. ? La actividad sexual ha cambiado desde que le hicieron la ltima prueba de deteccin y tiene un riesgo mayor de Best boy clamidia o Radio broadcast assistant. Pregntele al mdico si usted tiene riesgo.  Pregntele al mdico si usted tiene un alto riesgo de Museum/gallery curator VIH. El mdico tambin puede recomendarle un medicamento recetado para ayudar a evitar la infeccin por el VIH. Si elige tomar medicamentos para prevenir el VIH, primero debe [employer] de deteccin del VIH. Luego debe hacerse anlisis cada 22meses mientras est tomando los medicamentos. Glennis Brink  Si est por dejar de Librarian, academic (fase premenopusica) y usted puede quedar [place], busque asesoramiento antes de [place].  Tome de 400 a [id number] (mcg) de cido [employer] si [place].  Pida  mtodos de control de la natalidad (anticonceptivos) si desea evitar un embarazo no deseado. Osteoporosis y [place] La osteoporosis es una enfermedad en la que los huesos pierden los minerales y la fuerza por el avance de la edad. El resultado pueden ser fracturas en los [place]. Si tiene 65aos o ms, o si est en riesgo de sufrir osteoporosis y fracturas, pregunte a su mdico si debe:  Hacerse pruebas de deteccin de prdida sea.  Tomar un suplemento de calcio o de vitamina D para reducir el riesgo de fracturas.  Recibir terapia de reemplazo hormonal (TRH) para tratar los sntomas de la menopausia. Siga estas instrucciones en su casa: Estilo de vida  No consuma ningn producto que contenga nicotina o tabaco, como cigarrillos, cigarrillos electrnicos y tabaco de Higher education careers adviser. Si necesita ayuda para dejar de fumar, consulte al mdico.  No consuma drogas.  No comparta agujas.  Solicite ayuda a su mdico si necesita apoyo o informacin para abandonar las drogas. Consumo de alcohol  No beba alcohol si: ? Su mdico le indica no hacerlo. ? Est embarazada, puede estar embarazada o est tratando de quedar embarazada.  Si bebe alcohol: ? Limite la cantidad que consume de 0 a 1 medida por da. ? Limite la ingesta si est amamantando.  Est atento a la cantidad de alcohol que hay en las bebidas que toma. En los [place], una medida equivale a una botella de cerveza de 12oz (348ml), un vaso de vino de 5oz (12ml) o un vaso de una bebida alcohlica de alta graduacin de 1oz (67ml). Instrucciones generales  Realcese los estudios de rutina de la salud, dentales y de Public librarian.  [place].  Infrmele a su mdico si: ? Se siente deprimida con frecuencia. ? Alguna vez ha sido vctima de [place] o no se siente segura en su casa. Resumen  Adoptar un estilo de vida saludable y recibir atencin preventiva son importantes para promover la salud y Manufacturing systems engineer.  Siga las instrucciones del mdico acerca de una dieta saludable, el ejercicio y la realizacin de pruebas o exmenes para Engineer, building services.  Siga las instrucciones del mdico con respecto al control del colesterol y la presin arterial. Esta informacin no tiene Marine scientist el consejo del mdico. Asegrese de hacerle al mdico cualquier pregunta que tenga. Document Revised: 06/22/2018 Document Reviewed: 06/22/2018 Elsevier Patient Education  2021 [place], MD [facility]

## 2020-10-17 ENCOUNTER — Other Ambulatory Visit: Payer: Commercial Managed Care - PPO

## 2020-10-17 ENCOUNTER — Other Ambulatory Visit: Payer: Commercial Managed Care - PPO | Admitting: Obstetrics & Gynecology

## 2020-11-14 ENCOUNTER — Ambulatory Visit (INDEPENDENT_AMBULATORY_CARE_PROVIDER_SITE_OTHER): Payer: Commercial Managed Care - PPO

## 2020-11-14 ENCOUNTER — Encounter: Payer: Self-pay | Admitting: Obstetrics & Gynecology

## 2020-11-14 ENCOUNTER — Ambulatory Visit (INDEPENDENT_AMBULATORY_CARE_PROVIDER_SITE_OTHER): Payer: Commercial Managed Care - PPO | Admitting: Obstetrics & Gynecology

## 2020-11-14 ENCOUNTER — Other Ambulatory Visit: Payer: Self-pay

## 2020-11-14 VITALS — BP 120/70

## 2020-11-14 DIAGNOSIS — K5900 Constipation, unspecified: Secondary | ICD-10-CM | POA: Diagnosis not present

## 2020-11-14 DIAGNOSIS — D251 Intramural leiomyoma of uterus: Secondary | ICD-10-CM

## 2020-11-14 DIAGNOSIS — R102 Pelvic and perineal pain: Secondary | ICD-10-CM

## 2020-11-14 NOTE — Progress Notes (Signed)
    Samantha Robertson 1971/01/24 [id number]        50 y.o.  [id number]   RP: Left pelvic pain for Pelvic US  HPI: Postmenopause. No PMB.Mild left pelvic pain intermittently. Occasional mild hot flushes and night sweats.No pain with IC. Urine normal. Tendency for constipation.    OB History  Gravida Para Term Preterm AB Living  3 3 2 1   3   SAB IAB Ectopic Multiple Live Births          3    # Outcome Date GA Lbr Len/2nd Weight Sex Delivery Anes PTL Lv  3 Term     F CS-Unspec  N LIV  2 Term     F CS-Unspec  N LIV  1 Preterm     M Vag-Spont  Y LIV    Past medical history,surgical history, problem list, medications, allergies, family history and social history were all reviewed and documented in the EPIC chart.   Directed ROS with pertinent positives and negatives documented in the history of present illness/assessment and plan.  Exam:  Vitals:   11/14/20 1605  BP: 120/70   General appearance:  Normal  Pelvic US today: T/V images.  Anteverted uterus normal in size and shape, measured at 9.34 x 6.01 x 4.22 cm.  Very small intramural fibroids measured at 6 mm and 9 mm anteriorly and posteriorly.  Thin symmetrical endometrial lining with no mass or thickening seen, measured at 2.37 mm.  Both ovaries are normal in size with no follicles seen.  No adnexal mass.  No free fluid in the posterior cul-de-sac.   Assessment/Plan:  50 y.o. [id number]   1. Pelvic pain in female Left intermittent pelvic discomfort and pain probably associated with tendency for constipation.  Nutritional counseling for constipation given.  Pelvic ultrasound findings thoroughly reviewed with patient.  Patient reassured that both ovaries are normal.  She has 2 very small intramural fibroids which are not the cause of her intermittent pain, especially given that patient is menopausal.  Normal thin endometrial lining.  Princess Bruins MD, 4:44 PM 11/14/2020

## 2020-11-19 ENCOUNTER — Encounter: Payer: Self-pay | Admitting: Obstetrics & Gynecology

## 2021-01-28 ENCOUNTER — Encounter: Payer: Self-pay | Admitting: Emergency Medicine

## 2021-01-28 ENCOUNTER — Ambulatory Visit (INDEPENDENT_AMBULATORY_CARE_PROVIDER_SITE_OTHER): Payer: Commercial Managed Care - PPO | Admitting: Emergency Medicine

## 2021-01-28 ENCOUNTER — Other Ambulatory Visit: Payer: Self-pay

## 2021-01-28 VITALS — BP 110/60 | HR 67 | Temp 98.3°F | Ht 61.0 in | Wt 117.0 lb

## 2021-01-28 DIAGNOSIS — Z13228 Encounter for screening for other metabolic disorders: Secondary | ICD-10-CM

## 2021-01-28 DIAGNOSIS — Z130 Encounter for screening for diseases of the blood and blood-forming organs and certain disorders involving the immune mechanism: Secondary | ICD-10-CM | POA: Diagnosis not present

## 2021-01-28 DIAGNOSIS — Z1329 Encounter for screening for other suspected endocrine disorder: Secondary | ICD-10-CM | POA: Diagnosis not present

## 2021-01-28 DIAGNOSIS — Z0001 Encounter for general adult medical examination with abnormal findings: Secondary | ICD-10-CM

## 2021-01-28 DIAGNOSIS — Z13220 Encounter for screening for lipoid disorders: Secondary | ICD-10-CM | POA: Diagnosis not present

## 2021-01-28 DIAGNOSIS — G8929 Other chronic pain: Secondary | ICD-10-CM

## 2021-01-28 DIAGNOSIS — M79672 Pain in left foot: Secondary | ICD-10-CM

## 2021-01-28 DIAGNOSIS — Z1211 Encounter for screening for malignant neoplasm of colon: Secondary | ICD-10-CM

## 2021-01-28 DIAGNOSIS — R1013 Epigastric pain: Secondary | ICD-10-CM

## 2021-01-28 LAB — COMPREHENSIVE METABOLIC PANEL
ALT: 22 U/L (ref 0–35)
AST: 19 U/L (ref 0–37)
Albumin: 4.4 g/dL (ref 3.5–5.2)
Alkaline Phosphatase: 63 U/L (ref 39–117)
BUN: 17 mg/dL (ref 6–23)
CO2: 26 mEq/L (ref 19–32)
Calcium: 9.2 mg/dL (ref 8.4–10.5)
Chloride: 106 mEq/L (ref 96–112)
Creatinine, Ser: 0.7 mg/dL (ref 0.40–1.20)
GFR: 100.78 mL/min (ref >60.00)
Glucose, Bld: 87 mg/dL (ref 70–99)
Potassium: 4.1 mEq/L (ref 3.5–5.1)
Sodium: 138 mEq/L (ref 135–145)
Total Bilirubin: 0.5 mg/dL (ref 0.2–1.2)
Total Protein: 7 g/dL (ref 6.0–8.3)

## 2021-01-28 LAB — LIPID PANEL
Cholesterol: 167 mg/dL (ref 0–200)
HDL: 49.3 mg/dL (ref >39.00)
LDL Cholesterol: 96 mg/dL (ref 0–99)
NonHDL: 117.6
Total CHOL/HDL Ratio: 3
Triglycerides: 107 mg/dL (ref 0.0–149.0)
VLDL: 21.4 mg/dL (ref 0.0–40.0)

## 2021-01-28 LAB — CBC WITH DIFFERENTIAL/PLATELET
Basophils Absolute: 0 10*3/uL (ref 0.0–0.1)
Basophils Relative: 0.9 % (ref 0.0–3.0)
Eosinophils Absolute: 0.1 10*3/uL (ref 0.0–0.7)
Eosinophils Relative: 2.1 % (ref 0.0–5.0)
HCT: 37.8 % (ref 36.0–46.0)
Hemoglobin: 12.9 g/dL (ref 12.0–15.0)
Lymphocytes Relative: 43.7 % (ref 12.0–46.0)
Lymphs Abs: 2.1 10*3/uL (ref 0.7–4.0)
MCHC: 34.1 g/dL (ref 30.0–36.0)
MCV: 93.4 fl (ref 78.0–100.0)
Monocytes Absolute: 0.4 10*3/uL (ref 0.1–1.0)
Monocytes Relative: 7.6 % (ref 3.0–12.0)
Neutro Abs: 2.2 10*3/uL (ref 1.4–7.7)
Neutrophils Relative %: 45.7 % (ref 43.0–77.0)
Platelets: 228 10*3/uL (ref 150.0–400.0)
RBC: 4.05 Mil/uL (ref 3.87–5.11)
RDW: 13.6 % (ref 11.5–15.5)
WBC: 4.7 10*3/uL (ref 4.0–10.5)

## 2021-01-28 LAB — TSH: TSH: 1.39 u[IU]/mL (ref 0.35–5.50)

## 2021-01-28 LAB — HEMOGLOBIN A1C: Hgb A1c MFr Bld: 6.1 % (ref 4.6–6.5)

## 2021-01-28 MED ORDER — PANTOPRAZOLE SODIUM 40 MG PO TBEC: 40.0000 mg | DELAYED_RELEASE_TABLET | Freq: Every day | ORAL | 3 refills | Status: DC

## 2021-01-28 NOTE — Patient Instructions (Signed)
Mantenimiento de Technical sales engineer en [place] Maintenance, Female Adoptar un estilo de vida saludable y recibir atencin preventiva son importantes para promover la salud y Musician. Consulte al mdico sobre: El esquema adecuado para hacerse pruebas y exmenes peridicos. Cosas que puede hacer por su cuenta para prevenir enfermedades y [employer]. Qu debo saber sobre la dieta, el peso y el ejercicio? Consuma una dieta saludable  Consuma una dieta que incluya muchas verduras, frutas, productos lcteos con bajo contenido de [place] y Advertising account planner. No consuma muchos alimentos ricos en grasas slidas, azcares agregados o sodio.  Mantenga un peso saludable El ndice de masa muscular [facility]) se [place] the [place] para identificar problemas de [place]. Proporciona una estimacin de la grasa corporal basndose en el peso y la altura. Su mdico puede ayudarle a Radiation protection practitioner [place] y a Scientist, forensic o Theatre manager unpeso saludable. Haga ejercicio con regularidad Haga ejercicio con regularidad. Esta es una de las prcticas ms importantes que puede hacer por su salud. La mayora de los adultos deben seguir estas pautas: Optometrist, al menos, 166mnutos de actividad fsica por semana. El ejercicio debe aumentar la frecuencia cardaca y hNature conservation officertranspirar (ejercicio de intensidad moderada). Hacer ejercicios de fortalecimiento por lo mHalliburton Companypor semana. Agregue esto a su plan de ejercicio de intensidad moderada. Pasar menos tiempo sentados. Incluso la actividad fsica ligera puede ser beneficiosa. Controle sus niveles de colesterol y lpidos en la sangre Comience a realizarse anlisis de lpidos y colesterol en la sangre a los20aos y luego reptalos cada 5aos. Hgase controlar los niveles de colesterol con mayor frecuencia si: Sus niveles de lpidos y colesterol son altos. Es mayor de 40aos. Presenta un alto riesgo de padecer enfermedades cardacas. Qu debo saber sobre las pruebas de deteccin del  cncer? Segn su historia clnica y sus antecedentes familiares, es posible que deba realizarse pruebas de deteccin del cncer en diferentes edades. Esto puede incluir pruebas de deteccin de lo siguiente: Cncer de mama. Cncer de cuello uterino. Cncer colorrectal. Cncer de piel. Cncer de pulmn. Qu debo saber sobre la enfermedad cardaca, la diabetes y la hipertensinarterial? Presin arterial y enfermedad cardaca La hipertensin arterial causa enfermedades cardacas y aSerbiael riesgo de accidente cerebrovascular. Es ms probable que esto se manifieste en las personas que tienen lecturas de presin arterial alta, tienen ascendencia africana o tienen sobrepeso. Hgase controlar la presin arterial: Cada 3 a 5 aos si tiene entre 18 y 321aos. Todos los aos si es mayor de 4[place] Diabetes Realcese exmenes de deteccin de la diabetes con regularidad. Este anlisis revisa el nivel de azcar en la sangre en [place] Hgase las pruebas de deteccin: Cada tresaos despus de los 480aosde edad si tiene un peso normal y un bajo riesgo de padecer diabetes. Con ms frecuencia y a partir de uPocahontasedad inferior si tiene sobrepeso o un alto riesgo de padecer diabetes. Qu debo saber sobre la prevencin de infecciones? Hepatitis B Si tiene un riesgo ms alto de contraer hepatitis B, debe someterse a un examen de deteccin de este virus. Hable con el mdico para averiguar si tiene riesgode contraer la infeccin por hepatitis B. Hepatitis C Se recomienda el anlisis a: THex[employer]ty [employer]s1945 y 1965. Todas las personas que tengan un riesgo de haber contrado hepatitis C. Enfermedades de transmisin sexual (ETS) Hgase las pruebas de dProgramme researcher, broadcasting/film/videode ITS, incluidas la gonorrea y la clamidia, si: Es sexualmente activa y es menor de [place] Es mayor de 24aos, y eProbation officer  le informa que corre riesgo de tener este tipo de infecciones. La actividad sexual ha cambiado desde que le  hicieron la ltima prueba de deteccin y tiene un riesgo mayor de Best boy clamidia o Radio broadcast assistant. Pregntele al mdico si usted tiene riesgo. Pregntele al mdico si usted tiene un alto riesgo de Museum/gallery curator VIH. El mdico tambin puede recomendarle un medicamento recetado para ayudar a evitar la infeccin por el VIH. Si elige tomar medicamentos para prevenir el VIH, primero debe [employer] de deteccin del VIH. Luego debe hacerse anlisis cada 74mses mientras est tomando los medicamentos. Embarazo Si est por dejar de mLibrarian, academic(fase premenopusica) y usted puede quedar e[place] busque asesoramiento antes de qB[place] Tome de 400 a [id number](mcg) de cido fAnheuser-Buschsi qI[place] Pida mtodos de control de la natalidad (anticonceptivos) si desea evitar un embarazo no deseado. Osteoporosis y mBrazilLa osteoporosis es una enfermedad en la que los huesos pierden los minerales y la fuerza por el avance de la edad. El resultado pueden ser fracturas en los hSeven [place]aks Si tiene 65aos o ms, o si est en riesgo de sufrir osteoporosis y fracturas, pregunte a su mdico si debe: Hacerse pruebas de deteccin de prdida sea. Tomar un suplemento de calcio o de vitamina D para reducir el riesgo de fracturas. Recibir terapia de reemplazo hormonal (TRH) para tratar los sntomas de la menopausia. Siga estas instrucciones en su casa: Estilo de vida No consuma ningn producto que contenga nicotina o tabaco, como cigarrillos, cigarrillos electrnicos y tabaco de mHigher education careers adviser Si necesita ayuda para dejar de fumar, consulte al mdico. No consuma drogas. No comparta agujas. Solicite ayuda a su mdico si necesita apoyo o informacin para abandonar las drogas. Consumo de alcohol No beba alcohol si: Su mdico le indica no hacerlo. Est embarazada, puede estar embarazada o est tratando de qBotsw[place] Si bebe alcohol: Limite la cantidad que consume de 0 a 1 medida por  da. Limite la ingesta si est amamantando. Est atento a la cantidad de alcohol que hay en las bebidas que toma. En los [place] una medida equivale a una botella de cerveza de 12oz (3547m, un vaso de vino de 5oz (14850mo un vaso de una bebida alcohlica de alta graduacin de 1oz (40m54mInstrucciones generales Realcese los estudios de rutina de la salud, dentales y de la vPublic librarianntLaurel Hollowfrmele a su mdico si: Se siente deprimida con frecuencia. Alguna vez ha sido vctima de maltMcDermitto se siente segura en su casa. Resumen Adoptar un estilo de vida saludable y recibir atencin preventiva son importantes para promover la salud y el bMusicianga las instrucciones del mdico acerca de una dieta saludable, el ejercicio y la realizacin de pruebas o exmenes para deteEngineer, building servicesga las instrucciones del mdico con respecto al control del colesterol y la presin arterial. Esta informacin no tiene comoMarine scientistconsejo del mdico. Asegresede hacerle al mdico cualquier pregunta que tenga. Document Revised: 06/22/2018 Document Reviewed: 06/22/2018 Elsevier Patient Education  2022[place]

## 2021-01-28 NOTE — Progress Notes (Signed)
Samantha Robertson 50 y.o.   Chief Complaint  Patient presents with   Annual Exam    HISTORY OF PRESENT ILLNESS: This is a 49 y.o. female here for annual exam. Has a couple problems: #1 recurrent upper abdominal discomfort, "esponjado" with occasional nausea and sleepiness #2 chronic feet problems.  Needs podiatry referral. #3 bilateral hearing problems, recently fitted with hearing aids which are working fine. No chronic medical problems.  No chronic medications. Married mother of 3 children.  Very busy. No other complaints or medical concerns today.  HPI   Prior to Admission medications   Not on File    No Known Allergies  Patient Active Problem List   Diagnosis Date Noted   Kidney stone on left side 12/11/2019   Osteoarthritis of left foot 01/21/2017   Pain of both heels 01/01/2017   Abdominal pain, chronic, epigastric 05/07/2014   Varicose veins of lower extremities with other complications [id number]   Vitamin D insufficiency 01/22/2012   ONYCHOMYCOSIS, TOENAILS 12/06/2009   DERMATOPHYTOSIS OF FOOT 12/06/2009   EPIGASTRIC PAIN 03/09/2008   CHEST PAIN 07/11/2007   Prediabetes 07/11/2007    Past Medical History:  Diagnosis Date   Depression    Duodenal ulcer    Prediabetes    Vitamin D deficiency     Past Surgical History:  Procedure Laterality Date   CESAREAN SECTION  06/29/06    REPEAT C/S W BTSP   TUBAL LIGATION  06-29-06   AT TIME OF REPEAT C/S    Social History   Socioeconomic History   Marital status: Married    Spouse name: Not on file   Number of children: 3   Years of education: Not on file   Highest education level: Not on file  Occupational History   Occupation: Oncologist  Tobacco Use   Smoking status: Never   Smokeless tobacco: Never  Vaping Use   Vaping Use: Never used  Substance and Sexual Activity   Alcohol use: No    Alcohol/week: 0.0 standard drinks   Drug use: No   Sexual activity: Yes    Partners: Male    Birth  control/protection: Surgical    Comment: 1st intercourse- 19, partners- 10, married- 71 yrs   Other Topics Concern   Not on file  Social History Narrative   Not on file   Social Determinants of Health   Financial Resource Strain: Not on file  Food Insecurity: Not on file  Transportation Needs: Not on file  Physical Activity: Not on file  Stress: Not on file  Social Connections: Not on file  Intimate Partner Violence: Not on file    Family History  Problem Relation Age of Onset   Other Maternal Aunt        Cancer in blood   Kidney disease Father    Kidney disease Sister    Colon cancer Neg Hx    Colon polyps Neg Hx    Diabetes Neg Hx    Esophageal cancer Neg Hx    Gallbladder disease Neg Hx    Heart disease Neg Hx    Breast cancer Neg Hx      Review of Systems  Constitutional: Negative.  Negative for chills and fever.  HENT: Negative.  Negative for congestion and sore throat.   Respiratory: Negative.  Negative for cough and shortness of breath.   Cardiovascular: Negative.  Negative for chest pain and palpitations.  Gastrointestinal:  Positive for abdominal pain and nausea. Negative for blood in stool, diarrhea  and melena.  Genitourinary: Negative.  Negative for dysuria and hematuria.  Musculoskeletal:        Chronic foot pain  Skin: Negative.  Negative for rash.  Neurological: Negative.  Negative for dizziness and headaches.  All other systems reviewed and are negative.  Today's Vitals   01/28/21 1303  BP: 110/60  Pulse: 67  Temp: 98.3 F (36.8 C)  TempSrc: Oral  SpO2: 98%  Weight: 117 lb (53.1 kg)  Height: '5\' 1"'$  (1.549 m)   Body mass index is 22.11 kg/m.  Physical Exam Constitutional:      Appearance: Normal appearance.  HENT:     Head: Normocephalic.     Mouth/Throat:     Mouth: Mucous membranes are moist.     Pharynx: Oropharynx is clear.  Eyes:     Extraocular Movements: Extraocular movements intact.     Conjunctiva/sclera: Conjunctivae normal.      Pupils: Pupils are equal, round, and reactive to light.  Cardiovascular:     Rate and Rhythm: Normal rate and regular rhythm.     Pulses: Normal pulses.     Heart sounds: Normal heart sounds.  Pulmonary:     Effort: Pulmonary effort is normal.     Breath sounds: Normal breath sounds.  Abdominal:     General: Bowel sounds are normal. There is no distension.     Palpations: Abdomen is soft. There is no mass.     Tenderness: There is no abdominal tenderness.  Musculoskeletal:        General: Normal range of motion.     Cervical back: Normal range of motion and neck supple.  Skin:    General: Skin is warm and dry.     Capillary Refill: Capillary refill takes less than 2 seconds.  Neurological:     General: No focal deficit present.     Mental Status: She is alert and oriented to person, place, and time.  Psychiatric:        Mood and Affect: Mood normal.        Behavior: Behavior normal.     ASSESSMENT & PLAN: Samantha Robertson was seen today for annual exam.  Diagnoses and all orders for this visit:  Encounter for general adult medical examination with abnormal findings  Epigastric discomfort -     pantoprazole (PROTONIX) 40 MG tablet; Take 1 tablet (40 mg total) by mouth daily.  Screening for deficiency anemia -     CBC with Differential  Screening for lipoid disorders -     Lipid panel  Screening for endocrine, metabolic and immunity disorder -     Comprehensive metabolic panel -     Hemoglobin A1c -     TSH -     Hepatitis C antibody screen  Chronic pain in left foot -     Ambulatory referral to Podiatry  Modifiable risk factors discussed with patient. Anticipatory guidance according to age provided. The following topics were also discussed: Social Determinants of Health Smoking Diet and nutrition Benefits of exercise Cancer screening and need for colon cancer screening with either colonoscopy or Cologuard Vaccinations recommendations Cardiovascular risk  assessment Differential diagnosis of upper abdominal discomfort and possible GERD or gastritis. Mental health including depression and anxiety Fall and accident prevention  Patient Instructions  Mantenimiento de la salud en [place] Maintenance, Female Adoptar un estilo de vida saludable y recibir atencin preventiva son importantes para promover la salud y Musician. Consulte al mdico sobre: El esquema adecuado para Software engineer  y exmenes peridicos. Cosas que puede hacer por su cuenta para prevenir enfermedades y [employer]. Qu debo saber sobre la dieta, el peso y el ejercicio? Consuma una dieta saludable  Consuma una dieta que incluya muchas verduras, frutas, productos lcteos con bajo contenido de [place] y Advertising account planner. No consuma muchos alimentos ricos en grasas slidas, azcares agregados o sodio.  Mantenga un peso saludable El ndice de masa muscular [facility]) se [place] the [place] para identificar problemas de [place]. Proporciona una estimacin de la grasa corporal basndose en el peso y la altura. Su mdico puede ayudarle a Radiation protection practitioner [place] y a Scientist, forensic o Theatre manager unpeso saludable. Haga ejercicio con regularidad Haga ejercicio con regularidad. Esta es una de las prcticas ms importantes que puede hacer por su salud. La [employer] de los adultos deben seguir estas pautas: Optometrist, al menos, 150 minutos de actividad fsica por semana. El ejercicio debe aumentar la frecuencia cardaca y Nature conservation officer transpirar (ejercicio de intensidad moderada). Hacer ejercicios de fortalecimiento por lo [employer] por semana. Agregue esto a su plan de ejercicio de intensidad moderada. Pasar menos tiempo sentados. Incluso la actividad fsica ligera puede ser beneficiosa. Controle sus niveles de colesterol y lpidos en la sangre Comience a realizarse anlisis de lpidos y colesterol en la sangre a los20 aos y luego reptalos cada 5 aos. Hgase controlar los niveles de colesterol con mayor  frecuencia si: Sus niveles de lpidos y colesterol son altos. Es mayor de 15 aos. Presenta un alto riesgo de padecer enfermedades cardacas. Qu debo saber sobre las pruebas de deteccin del cncer? Segn su historia clnica y sus antecedentes familiares, es posible que deba realizarse pruebas de deteccin del cncer en diferentes edades. Esto puede incluir pruebas de deteccin de lo siguiente: Cncer de mama. Cncer de cuello uterino. Cncer colorrectal. Cncer de piel. Cncer de pulmn. Qu debo saber sobre la enfermedad cardaca, la diabetes y la hipertensinarterial? Presin arterial y enfermedad cardaca La hipertensin arterial causa enfermedades cardacas y [place] el riesgo de accidente cerebrovascular. Es ms probable que esto se manifieste en las personas que tienen lecturas de presin arterial alta, tienen ascendencia africana o tienen sobrepeso. Hgase controlar la presin arterial: Cada 3 a 5 aos si tiene entre 18 y 84 aos. Todos los aos si es mayor de 40 aos. Diabetes Realcese exmenes de deteccin de la diabetes con regularidad. Este anlisis revisa el nivel de azcar en la sangre en [place]. Hgase las pruebas de deteccin: Cada tres aos despus de los 35 aos de edad si tiene un peso normal y un bajo riesgo de padecer diabetes. Con ms frecuencia y a partir de [place] edad inferior si tiene sobrepeso o un alto riesgo de padecer diabetes. Qu debo saber sobre la prevencin de infecciones? Hepatitis B Si tiene un riesgo ms alto de contraer hepatitis B, debe someterse a un examen de deteccin de este virus. Hable con el mdico para averiguar si tiene riesgode contraer la infeccin por hepatitis B. Hepatitis C Se recomienda el anlisis a: [employer] 1945 y 1965. Todas las personas que tengan un riesgo de haber contrado hepatitis C. Enfermedades de transmisin sexual (ETS) Hgase las pruebas de Programme researcher, broadcasting/film/video de ITS, incluidas la gonorrea y la clamidia,  si: Es sexualmente activa y es menor de 78 aos. Es mayor de 69 aos, y Investment banker, operational informa que corre riesgo de tener este tipo de infecciones. La actividad sexual ha cambiado desde que le hicieron la ltima prueba de deteccin y tiene un riesgo  mayor de tener clamidia o gonorrea. Pregntele al mdico si usted tiene riesgo. Pregntele al mdico si usted tiene un alto riesgo de Museum/gallery curator VIH. El mdico tambin puede recomendarle un medicamento recetado para ayudar a evitar la infeccin por el VIH. Si elige tomar medicamentos para prevenir el VIH, primero debe Pilgr[employer]'s Pride de deteccin del VIH. Luego debe hacerse anlisis cada 3 meses mientras est tomando los medicamentos. Embarazo Si est por dejar de Librarian, academic (fase premenopusica) y usted puede quedar [place], busque asesoramiento antes de [place]. Tome de 400 a 800 microgramos (mcg) de cido [employer] si [place]. Pida mtodos de control de la natalidad (anticonceptivos) si desea evitar un embarazo no deseado. Osteoporosis y [place] La osteoporosis es una enfermedad en la que los huesos pierden los minerales y la fuerza por el avance de la edad. El resultado pueden ser fracturas en los [place]. Si tiene 33 aos o ms, o si est en riesgo de sufrir osteoporosis y fracturas, pregunte a su mdico si debe: Hacerse pruebas de deteccin de prdida sea. Tomar un suplemento de calcio o de vitamina D para reducir el riesgo de fracturas. Recibir terapia de reemplazo hormonal (TRH) para tratar los sntomas de la menopausia. Siga estas instrucciones en su casa: Estilo de vida No consuma ningn producto que contenga nicotina o tabaco, como cigarrillos, cigarrillos electrnicos y tabaco de Higher education careers adviser. Si necesita ayuda para dejar de fumar, consulte al mdico. No consuma drogas. No comparta agujas. Solicite ayuda a su mdico si necesita apoyo o informacin para abandonar las drogas. Consumo de alcohol No beba alcohol  si: Su mdico le indica no hacerlo. Est embarazada, puede estar embarazada o est tratando de [place]. Si bebe alcohol: Limite la cantidad que consume de 0 a 1 medida por da. Limite la ingesta si est amamantando. Est atento a la cantidad de alcohol que hay en las bebidas que toma. En los [place], una medida equivale a una botella de cerveza de 12 oz (355 ml), un vaso de vino de 5 oz (148 ml) o un vaso de una bebida alcohlica de alta graduacin de 1 oz (44 ml). Instrucciones generales Realcese los estudios de rutina de la salud, dentales y de Public librarian. [place]. Infrmele a su mdico si: Se siente deprimida con frecuencia. Alguna vez ha sido vctima de [place] o no se siente segura en su casa. Resumen Adoptar un estilo de vida saludable y recibir atencin preventiva son importantes para promover la salud y Musician. Siga las instrucciones del mdico acerca de una dieta saludable, el ejercicio y la realizacin de pruebas o exmenes para Engineer, building services. Siga las instrucciones del mdico con respecto al control del colesterol y la presin arterial. Esta informacin no tiene Marine scientist el consejo del mdico. Asegresede hacerle al mdico cualquier pregunta que tenga. Document Revised: 06/22/2018 Document Reviewed: 06/22/2018 Elsevier Patient Education  2022 [place], MD [facility]

## 2021-02-10 ENCOUNTER — Other Ambulatory Visit: Payer: Self-pay | Admitting: Obstetrics & Gynecology

## 2021-02-10 DIAGNOSIS — Z1231 Encounter for screening mammogram for malignant neoplasm of breast: Secondary | ICD-10-CM

## 2021-02-16 LAB — COLOGUARD: Cologuard: NEGATIVE

## 2021-02-19 ENCOUNTER — Ambulatory Visit: Payer: Commercial Managed Care - PPO | Admitting: Podiatry

## 2021-03-03 ENCOUNTER — Ambulatory Visit (INDEPENDENT_AMBULATORY_CARE_PROVIDER_SITE_OTHER): Payer: Commercial Managed Care - PPO | Admitting: Podiatry

## 2021-03-03 ENCOUNTER — Other Ambulatory Visit: Payer: Self-pay

## 2021-03-03 ENCOUNTER — Encounter: Payer: Self-pay | Admitting: Podiatry

## 2021-03-03 DIAGNOSIS — M722 Plantar fascial fibromatosis: Secondary | ICD-10-CM

## 2021-03-03 MED ORDER — MELOXICAM 15 MG PO TABS: 15.0000 mg | ORAL_TABLET | Freq: Every day | ORAL | 1 refills | Status: DC

## 2021-03-03 MED ORDER — BETAMETHASONE SOD PHOS & ACET 6 (3-3) MG/ML IJ SUSP
3.0000 mg | Freq: Once | INTRAMUSCULAR | Status: AC
  Administered 2021-03-03: 3 mg via INTRA_ARTICULAR

## 2021-03-03 MED ORDER — METHYLPREDNISOLONE 4 MG PO TBPK: ORAL_TABLET | ORAL | 0 refills | Status: DC

## 2021-03-03 NOTE — Progress Notes (Signed)
   Subjective: 50 y.o. female presenting today for follow up evaluation of plantar fasciitis of the bilateral feet. She states the pain has improved but the left foot still hurts more than the right.  Patient was last seen in the office over 1 year ago and she continues to wear the OTC insoles.  She continues to work at the same job which aggravates her feet.  No new complaints at this time  Past Medical History:  Diagnosis Date   Depression    Duodenal ulcer    Prediabetes    Vitamin D deficiency      Objective: Physical Exam General: The patient is alert and oriented x3 in no acute distress.  Dermatology: Skin is warm, dry and supple bilateral lower extremities. Negative for open lesions or macerations bilateral.   Vascular: Dorsalis Pedis and Posterior Tibial pulses palpable bilateral.  Capillary fill time is immediate to all digits.  Neurological: Epicritic and protective threshold intact bilateral.   Musculoskeletal: Tenderness to palpation to the plantar aspect of the bilateral heels along the plantar fascia. All other joints range of motion within normal limits bilateral. Strength 5/5 in all groups bilateral.   Assessment: 1. plantar fasciitis bilateral feet  Plan of Care:  1. Patient evaluated.    2. Injection of 0.5cc Celestone soluspan injected into the bilateral heels.  3.  Prescription for Medrol Dosepak, then continue taking Meloxicam.  Refill provided. 4. Continue using OTC Powerstep insoles.  Today the patient was molded for custom molded orthotics 5. Return to clinic as needed.     Works assembly line pushing a foot pedal.    Edrick Kins, DPM [facility]  Dr. Edrick Kins, DPM    [address], [place] [address]                Office [phone]  Fax [phone]

## 2021-03-31 ENCOUNTER — Ambulatory Visit (INDEPENDENT_AMBULATORY_CARE_PROVIDER_SITE_OTHER): Payer: Commercial Managed Care - PPO

## 2021-03-31 ENCOUNTER — Other Ambulatory Visit: Payer: Self-pay

## 2021-03-31 ENCOUNTER — Ambulatory Visit (HOSPITAL_COMMUNITY)
Admission: EM | Admit: 2021-03-31 | Discharge: 2021-03-31 | Disposition: A | Discharge status: Home / Self Care | Payer: Commercial Managed Care - PPO | Attending: Emergency Medicine | Admitting: Emergency Medicine

## 2021-03-31 ENCOUNTER — Encounter (HOSPITAL_COMMUNITY): Payer: Self-pay

## 2021-03-31 DIAGNOSIS — Z20822 Contact with and (suspected) exposure to covid-19: Secondary | ICD-10-CM | POA: Diagnosis not present

## 2021-03-31 DIAGNOSIS — R509 Fever, unspecified: Secondary | ICD-10-CM | POA: Diagnosis not present

## 2021-03-31 DIAGNOSIS — J069 Acute upper respiratory infection, unspecified: Secondary | ICD-10-CM | POA: Diagnosis not present

## 2021-03-31 DIAGNOSIS — R051 Acute cough: Secondary | ICD-10-CM | POA: Insufficient documentation

## 2021-03-31 DIAGNOSIS — R059 Cough, unspecified: Secondary | ICD-10-CM | POA: Diagnosis not present

## 2021-03-31 LAB — SARS CORONAVIRUS 2 (TAT 6-24 HRS): SARS Coronavirus 2: NEGATIVE

## 2021-03-31 LAB — POC INFLUENZA A AND B ANTIGEN (URGENT CARE ONLY)
INFLUENZA A ANTIGEN, POC: NEGATIVE
INFLUENZA B ANTIGEN, POC: NEGATIVE

## 2021-03-31 MED ORDER — BENZONATATE 100 MG PO CAPS: 100.0000 mg | ORAL_CAPSULE | Freq: Three times a day (TID) | ORAL | 0 refills | Status: DC | PRN

## 2021-03-31 NOTE — ED Triage Notes (Signed)
Pt presents with c./o cough and fever x 4 days. States she has been taking Robitussin and Tylenol for relief.

## 2021-03-31 NOTE — ED Provider Notes (Signed)
[place]    CSN: [id number] Arrival date & time: 03/31/21  1003      History   Chief Complaint Chief Complaint  Patient presents with   Cough   Fever    HPI Samantha Robertson is a 50 y.o. female. Pt reports cough and fever for 4 days.  Fever is worse at night.  She has been taking Robitussin and Tylenol with minimal relief.  Also complains of congestion, sore throat, right ear pain.  Patient reports left sided middle back pain with inspiration and cough. Other than the cough, she feels she is getting better  Pt reports son was dx with influenza last week on 03/25/21; he is completely recovered now.   This visit was conducted with the help of a Spanish video interpreter   Cough Associated symptoms: fever   Fever Associated symptoms: cough    Past Medical History:  Diagnosis Date   Depression    Duodenal ulcer    Prediabetes    Vitamin D deficiency     Patient Active Problem List   Diagnosis Date Noted   Kidney stone on left side 12/11/2019   Osteoarthritis of left foot 01/21/2017   Pain of both heels 01/01/2017   Abdominal pain, chronic, epigastric 05/07/2014   Varicose veins of lower extremities with other complications 41/28/7867   Vitamin D insufficiency 01/22/2012   ONYCHOMYCOSIS, TOENAILS 12/06/2009   DERMATOPHYTOSIS OF FOOT 12/06/2009   EPIGASTRIC PAIN 03/09/2008   CHEST PAIN 07/11/2007   Prediabetes 07/11/2007    Past Surgical History:  Procedure Laterality Date   CESAREAN SECTION  06/29/06    REPEAT C/S W BTSP   TUBAL LIGATION  06-29-06   AT TIME OF REPEAT C/S    OB History     Gravida  3   Para  3   Term  2   Preterm  1   AB      Living  3      SAB      IAB      Ectopic      Multiple      Live Births  3            Home Medications    Prior to Admission medications   Medication Sig Start Date End Date Taking? Authorizing Provider  benzonatate (TESSALON) 100 MG capsule Take 1 capsule (100 mg total)  by mouth 3 (three) times daily as needed for cough. 03/31/21  Yes Carvel Getting, NP  meloxicam (MOBIC) 15 MG tablet Take 1 tablet (15 mg total) by mouth daily. 03/03/21   Edrick Kins, DPM  methylPREDNISolone (MEDROL DOSEPAK) 4 MG TBPK tablet 6 day dose pack - take as directed 03/03/21   Edrick Kins, DPM  pantoprazole (PROTONIX) 40 MG tablet Take 1 tablet (40 mg total) by mouth daily. 01/28/21   Horald Pollen, MD    Family History Family History  Problem Relation Age of Onset   Other Maternal Aunt        Cancer in blood   Kidney disease Father    Kidney disease Sister    Colon cancer Neg Hx    Colon polyps Neg Hx    Diabetes Neg Hx    Esophageal cancer Neg Hx    Gallbladder disease Neg Hx    Heart disease Neg Hx    Breast cancer Neg Hx     Social History Social History   Tobacco Use   Smoking status: Never  Smokeless tobacco: Never  Vaping Use   Vaping Use: Never used  Substance Use Topics   Alcohol use: No    Alcohol/week: 0.0 standard drinks   Drug use: No     Allergies   Patient has no known allergies.   Review of Systems Review of Systems  Constitutional:  Positive for fever.  Respiratory:  Positive for cough.     Physical Exam Triage Vital Signs ED Triage Vitals  Enc Vitals Group     BP 03/31/21 1231 109/74     Pulse Rate 03/31/21 1231 67     Resp 03/31/21 1231 18     Temp 03/31/21 1231 98.9 F (37.2 C)     Temp Source 03/31/21 1231 Oral     SpO2 03/31/21 1231 98 %     Weight --      Height --      Head Circumference --      Peak Flow --      Pain Score 03/31/21 1230 3     Pain Loc --      Pain Edu? --      Excl. in [place]? --    No data found.  Updated Vital Signs BP 109/74 (BP Location: Left Arm)   Pulse 67   Temp 98.9 F (37.2 C) (Oral)   Resp 18   LMP  (LMP Unknown)   SpO2 98%   Visual Acuity Right Eye Distance:   Left Eye Distance:   Bilateral Distance:    Right Eye Near:   Left Eye Near:    Bilateral Near:      Physical Exam Constitutional:      Appearance: Normal appearance. She is ill-appearing.  HENT:     Right Ear: Tympanic membrane, ear canal and external ear normal.     Left Ear: Tympanic membrane, ear canal and external ear normal.     Nose: Congestion present.     Mouth/Throat:     Mouth: Mucous membranes are moist.     Pharynx: Oropharynx is clear.  Cardiovascular:     Rate and Rhythm: Normal rate and regular rhythm.  Pulmonary:     Effort: Pulmonary effort is normal.     Breath sounds: Normal breath sounds.     Comments: Cough with deep inspiration. Chest:     Chest wall: No tenderness.     Comments: No left-sided back tenderness to palpation over left lower lobe. Lymphadenopathy:     Cervical: No cervical adenopathy.  Neurological:     Mental Status: She is alert.     UC Treatments / Results  Labs (all labs ordered are listed, but only abnormal results are displayed) Labs Reviewed  SARS CORONAVIRUS 2 (TAT 6-24 HRS)  POC INFLUENZA A AND B ANTIGEN (URGENT CARE ONLY)    EKG   Radiology DG Chest 2 View  Result Date: 03/31/2021 CLINICAL DATA:  Cough, fever EXAM: CHEST - 2 VIEW COMPARISON:  12/06/2009 FINDINGS: The heart size and mediastinal contours are within normal limits. Both lungs are clear. The visualized skeletal structures are unremarkable. IMPRESSION: No active cardiopulmonary disease. Electronically Signed   By: Davina Poke D.O.   On: 03/31/2021 13:41    Procedures Procedures (including critical care time)  Medications Ordered in UC Medications - No data to display  Initial Impression / Assessment and Plan / UC Course  I have reviewed the triage vital signs and the nursing notes.  Pertinent labs & imaging results that were available during my care of  the patient were reviewed by me and considered in my medical decision making (see chart for details).  CXR negative for pneumonia. Influenza negative. COVID pending. Pt is mildly ill but does feel  she is getting better. Cough is biggest complaint.  Rx benzonatate. Requests to be able to return to work tomorrow - given note.     Final Clinical Impressions(s) / UC Diagnoses   Final diagnoses:  Viral upper respiratory tract infection  Acute cough     Discharge Instructions      Your influenza test is negative. Your covid test result won't be available for a day or two.  You will get a phone call if your COVID test is positive.  You will not get a phone call if your COVID test is negative.     ED Prescriptions     Medication Sig Dispense Auth. Provider   benzonatate (TESSALON) 100 MG capsule Take 1 capsule (100 mg total) by mouth 3 (three) times daily as needed for cough. 21 capsule Carvel Getting, NP      PDMP not reviewed this encounter.   Carvel Getting, NP 03/31/21 1435

## 2021-03-31 NOTE — Discharge Instructions (Addendum)
Your influenza test is negative. Your covid test result won't be available for a day or two.  You will get a phone call if your COVID test is positive.  You will not get a phone call if your COVID test is negative.

## 2021-04-25 ENCOUNTER — Other Ambulatory Visit: Payer: Self-pay | Admitting: Podiatry

## 2021-05-23 ENCOUNTER — Ambulatory Visit
Admission: RE | Admit: 2021-05-23 | Discharge: 2021-05-23 | Disposition: A | Discharge status: Home / Self Care | Payer: Commercial Managed Care - PPO | Source: Ambulatory Visit | Attending: Obstetrics & Gynecology | Admitting: Obstetrics & Gynecology

## 2021-05-23 DIAGNOSIS — Z1231 Encounter for screening mammogram for malignant neoplasm of breast: Secondary | ICD-10-CM

## 2021-07-28 ENCOUNTER — Telehealth: Payer: Self-pay | Admitting: Podiatry

## 2021-07-28 NOTE — Telephone Encounter (Signed)
Lvm for patient to call and schedule an appointment for orthotic pick up °

## 2021-08-12 ENCOUNTER — Ambulatory Visit (INDEPENDENT_AMBULATORY_CARE_PROVIDER_SITE_OTHER): Payer: Self-pay

## 2021-08-12 ENCOUNTER — Other Ambulatory Visit: Payer: Self-pay

## 2021-08-12 DIAGNOSIS — M722 Plantar fascial fibromatosis: Secondary | ICD-10-CM

## 2021-08-12 NOTE — Progress Notes (Signed)
SITUATION: Reason for Visit: Fitting and Delivery of Custom Fabricated Foot Orthoses Patient Report: Patient reports comfort and is satisfied with device.  OBJECTIVE DATA: Patient History / Diagnosis:     ICD-10-CM   1. Plantar fasciitis, bilateral  M72.2       Provided Device:  Custom Functional Foot Orthotics     [facility]: [id number]  GOAL OF ORTHOSIS - Improve gait - Decrease energy expenditure - Improve Balance - Provide Triplanar stability of foot complex - Facilitate motion  ACTIONS PERFORMED Patient was fit with foot orthotics trimmed to shoe last. Patient tolerated fittign procedure.   Patient was provided with verbal and written instruction and demonstration regarding donning, doffing, wear, care, proper fit, function, purpose, cleaning, and use of the orthosis and in all related precautions and risks and benefits regarding the orthosis.  Patient was also provided with verbal instruction regarding how to report any failures or malfunctions of the orthosis and necessary follow up care. Patient was also instructed to contact our office regarding any change in status that may affect the function of the orthosis.  Patient demonstrated independence with proper donning, doffing, and fit and verbalized understanding of all instructions.  PLAN: Patient is to follow up in one week or as necessary (PRN). All questions were answered and concerns addressed. Plan of care was discussed with and agreed upon by the patient.

## 2021-08-13 ENCOUNTER — Ambulatory Visit: Payer: Self-pay | Admitting: Podiatry

## 2021-09-12 ENCOUNTER — Encounter: Payer: Self-pay | Admitting: Obstetrics & Gynecology

## 2021-09-12 ENCOUNTER — Ambulatory Visit (INDEPENDENT_AMBULATORY_CARE_PROVIDER_SITE_OTHER): Payer: BC Managed Care – PPO | Admitting: Obstetrics & Gynecology

## 2021-09-12 VITALS — BP 104/68 | HR 68 | Resp 16 | Ht 59.75 in | Wt 125.0 lb

## 2021-09-12 DIAGNOSIS — Z01419 Encounter for gynecological examination (general) (routine) without abnormal findings: Secondary | ICD-10-CM

## 2021-09-12 DIAGNOSIS — Z780 Asymptomatic menopausal state: Secondary | ICD-10-CM

## 2021-09-12 NOTE — Progress Notes (Signed)
? ? ?Arienne Gartin Dec 06, 1970 [id number] ? ? ?History:    51 y.o. G3P3L3 Married.  Status post tubal ligation. ?  ?RP:  Established patient presenting for annual gyn exam  ?  ?HPI:  Postmenopause, well on no HRT.  No PMB. No pelvic pain. No pain with IC.  Pap Neg 08/2020.  No h/o abnormal Pap. Urine/BMs normal. Breast normal.  Mammo Neg 05/2021.  Body mass index 24.62.  Needs to exercise more.  Fasting health labs with Fam MD.  Schedule [facility]. ? ?Past medical history,surgical history, family history and social history were all reviewed and documented in the EPIC chart. ? ?Gynecologic History ?No LMP recorded (lmp unknown). Patient is postmenopausal. ? ?Obstetric History ?OB History  ?Gravida Para Term Preterm AB Living  ?'3 3 2 1   3  '$ ?SAB IAB Ectopic Multiple Live Births  ?        3  ?  ?# Outcome Date GA Lbr Len/2nd Weight Sex Delivery Anes PTL Lv  ?3 Term     F CS-Unspec  N LIV  ?2 Term     F CS-Unspec  N LIV  ?1 Preterm     M Vag-Spont  Y LIV  ? ? ? ?ROS: A ROS was performed and pertinent positives and negatives are included in the history. ? GENERAL: No fevers or chills. HEENT: No change in vision, no earache, sore throat or sinus congestion. NECK: No pain or stiffness. CARDIOVASCULAR: No chest pain or pressure. No palpitations. PULMONARY: No shortness of breath, cough or wheeze. GASTROINTESTINAL: No abdominal pain, nausea, vomiting or diarrhea, melena or bright red blood per rectum. GENITOURINARY: No urinary frequency, urgency, hesitancy or dysuria. MUSCULOSKELETAL: No joint or muscle pain, no back pain, no recent trauma. DERMATOLOGIC: No rash, no itching, no lesions. ENDOCRINE: No polyuria, polydipsia, no heat or cold intolerance. No recent change in weight. HEMATOLOGICAL: No anemia or easy bruising or bleeding. NEUROLOGIC: No headache, seizures, numbness, tingling or weakness. PSYCHIATRIC: No depression, no loss of interest in normal activity or change in sleep pattern.  ?  ? ?Exam: ? ? ?BP 104/68    Pulse 68   Resp 16   Ht 4' 11.75" (1.518 m)   Wt 125 lb (56.7 kg)   LMP  (LMP Unknown) Comment: BTL  BMI 24.62 kg/m?  ? ?Body mass index is 24.62 kg/m?. ? ?General appearance : Well developed well nourished female. No acute distress ?HEENT: Eyes: no retinal hemorrhage or exudates,  Neck supple, trachea midline, no carotid bruits, no thyroidmegaly ?Lungs: Clear to auscultation, no rhonchi or wheezes, or rib retractions  ?Heart: Regular rate and rhythm, no murmurs or gallops ?Breast:Examined in sitting and supine position were symmetrical in appearance, no palpable masses or tenderness,  no skin retraction, no nipple inversion, no nipple discharge, no skin discoloration, no axillary or supraclavicular lymphadenopathy ?Abdomen: no palpable masses or tenderness, no rebound or guarding ?Extremities: no edema or skin discoloration or tenderness ? ?Pelvic: Vulva: Normal ?            Vagina: No gross lesions or discharge ? Cervix: No gross lesions or discharge ? Uterus  AV, normal size, shape and consistency, non-tender and mobile ? Adnexa  Without masses or tenderness ? Anus: Normal ? ? ?Assessment/Plan:  51 y.o. female for annual exam  ? ?1. Well female exam with routine gynecological exam ?Postmenopause, well on no HRT.  No PMB. No pelvic pain. No pain with IC.  Pap Neg 08/2020.  No h/o abnormal Pap.  Urine/BMs normal. Breast normal.  Mammo Neg 05/2021.  Body mass index 24.62.  Needs to exercise more.  Fasting health labs with Fam MD.  Schedule [facility]. ? ?2. Postmenopause  ?Postmenopause, well on no HRT.  No PMB. No pelvic pain. No pain with IC. ? ?Princess Bruins MD, 4:02 PM 09/12/2021 ? ?  ?

## 2022-05-05 ENCOUNTER — Other Ambulatory Visit: Payer: Self-pay | Admitting: Obstetrics & Gynecology

## 2022-05-05 DIAGNOSIS — Z1231 Encounter for screening mammogram for malignant neoplasm of breast: Secondary | ICD-10-CM

## 2022-05-25 ENCOUNTER — Ambulatory Visit: Payer: BC Managed Care – PPO | Admitting: Nurse Practitioner

## 2022-05-26 ENCOUNTER — Ambulatory Visit (INDEPENDENT_AMBULATORY_CARE_PROVIDER_SITE_OTHER): Payer: BC Managed Care – PPO | Admitting: Nurse Practitioner

## 2022-05-26 ENCOUNTER — Encounter: Payer: Self-pay | Admitting: Nurse Practitioner

## 2022-05-26 VITALS — BP 110/64 | HR 58

## 2022-05-26 DIAGNOSIS — N644 Mastodynia: Secondary | ICD-10-CM | POA: Diagnosis not present

## 2022-05-26 NOTE — Progress Notes (Signed)
   Acute Office Visit  Subjective:    Patient ID: Samantha Robertson, female    DOB: 09-19-1970, 51 y.o.   MRN: [id number]   HPI 51 y.o. presents today for left breast lump, itching, and pain x 1 month. Pain is intermittent and either occurs in inner or outer regions. Itching is in inner region near nipple. She called imaging center to schedule screening mammogram and mentioned concerns so she is here for evaluation. Normal mammogram history. Last mammogram 05/2021.    Review of Systems  Constitutional: Negative.   Left breast: Positive for pain, itching, and lump. Negative for skin changes, redness, swelling, or nipple discharge.  Right breast: Negative.     Objective:    Physical Exam Constitutional:      Appearance: Normal appearance.  Chest:  Breasts:    Right: Normal.     Left: No swelling, inverted nipple, mass, nipple discharge, skin change or tenderness.       BP 110/64   Pulse (!) 58   LMP  (LMP Unknown) Comment: BTL  SpO2 100%  Wt Readings from Last 3 Encounters:  09/12/21 125 lb (56.7 kg)  01/28/21 117 lb (53.1 kg)  10/01/20 114 lb (51.7 kg)        Patient informed chaperone available to be present for breast and/or pelvic exam. Patient has requested no chaperone to be present. Patient has been advised what will be completed during breast and pelvic exam.   Assessment & Plan:   Problem List Items Addressed This Visit   None Visit Diagnoses     Pain of left breast    -  Primary      Plan: Will send referral for diagnostic mammogram.      Tamela Gammon DNP, 4:31 PM 05/26/2022

## 2022-05-28 ENCOUNTER — Telehealth: Payer: Self-pay

## 2022-05-28 DIAGNOSIS — N644 Mastodynia: Secondary | ICD-10-CM

## 2022-05-28 NOTE — Telephone Encounter (Signed)
Cheri @ [facility] got pt scheduled for 06/23/2022 @ 130pm. Will have in office interpreter notify pt.

## 2022-05-28 NOTE — Telephone Encounter (Signed)
-----   Message from Tamela Gammon, NP sent at 05/26/2022  4:30 PM EST ----- Regarding: Diagnostic mammogram Please send referral for diagnostic mammogram for left breast pain. She is due for screening mammogram too. Thanks.

## 2022-05-28 NOTE — Telephone Encounter (Signed)
Samantha Robertson, [name] Patient infomed

## 2022-06-10 ENCOUNTER — Encounter (HOSPITAL_COMMUNITY): Payer: Self-pay

## 2022-06-10 ENCOUNTER — Ambulatory Visit (HOSPITAL_COMMUNITY)
Admission: EM | Admit: 2022-06-10 | Discharge: 2022-06-10 | Disposition: A | Discharge status: Home / Self Care | Payer: BC Managed Care – PPO | Attending: Urgent Care | Admitting: Urgent Care

## 2022-06-10 DIAGNOSIS — J111 Influenza due to unidentified influenza virus with other respiratory manifestations: Secondary | ICD-10-CM | POA: Diagnosis not present

## 2022-06-10 DIAGNOSIS — J029 Acute pharyngitis, unspecified: Secondary | ICD-10-CM

## 2022-06-10 MED ORDER — OSELTAMIVIR PHOSPHATE 75 MG PO CAPS: 75.0000 mg | ORAL_CAPSULE | Freq: Two times a day (BID) | ORAL | 0 refills | Status: AC

## 2022-06-10 MED ORDER — LIDOCAINE VISCOUS HCL 2 % MT SOLN: 10.0000 mL | Freq: Four times a day (QID) | OROMUCOSAL | 0 refills | Status: DC | PRN

## 2022-06-10 NOTE — Discharge Instructions (Signed)
Due to your confirmed exposure to influenza, and symptoms less than 48 hours, we will treat you as positive for flu.  There is no need to test.  Your throat is consistent with viral pharyngitis.  I have prescribed you a medication to help with the discomfort.  Additionally you may take ibuprofen or Tylenol to help with the pain.

## 2022-06-10 NOTE — ED Provider Notes (Signed)
[place]    CSN: [id number] Arrival date & time: 06/10/22  0915      History   Chief Complaint Chief Complaint  Patient presents with   Cough   Sore Throat    HPI Amya Hlad is a 51 y.o. female.   Pleasant 51 year old female presents to sore throat for the past 4 days, fever and bodyaches for the past 2 days.  She states the throat has been bothering her for 4 days, hurts to swallow.  She developed a fever body ache and chills 2 days ago.  She states that her daughter was diagnosed with influenza.  Patient endorses a dry cough, but denies shortness of breath chest pain or palpitations.  She has not been taking anything over-the-counter for her symptoms apart from promethazine.  She does not take any prescription medication.   Cough Sore Throat    Past Medical History:  Diagnosis Date   Depression    Duodenal ulcer    Prediabetes    Vitamin D deficiency     Patient Active Problem List   Diagnosis Date Noted   Kidney stone on left side 12/11/2019   Osteoarthritis of left foot 01/21/2017   Pain of both heels 01/01/2017   Abdominal pain, chronic, epigastric 05/07/2014   Varicose veins of lower extremities with other complications 90/24/0973   Vitamin D insufficiency 01/22/2012   ONYCHOMYCOSIS, TOENAILS 12/06/2009   DERMATOPHYTOSIS OF FOOT 12/06/2009   EPIGASTRIC PAIN 03/09/2008   CHEST PAIN 07/11/2007   Prediabetes 07/11/2007    Past Surgical History:  Procedure Laterality Date   CESAREAN SECTION  06/29/06    REPEAT C/S W BTSP   TUBAL LIGATION  06-29-06   AT TIME OF REPEAT C/S    OB History     Gravida  3   Para  3   Term  2   Preterm  1   AB      Living  3      SAB      IAB      Ectopic      Multiple      Live Births  3            Home Medications    Prior to Admission medications   Medication Sig Start Date End Date Taking? Authorizing Provider  magic mouthwash (lidocaine, diphenhydrAMINE, alum & mag  hydroxide) suspension Swish and spit 10 mLs 4 (four) times daily as needed for mouth pain. 06/10/22  Yes Latoria Dry L, PA  oseltamivir (TAMIFLU) 75 MG capsule Take 1 capsule (75 mg total) by mouth 2 (two) times daily for 5 days. 06/10/22 06/15/22 Yes Jamill Wetmore, Sherren Kerns, PA    Family History Family History  Problem Relation Age of Onset   Other Maternal Aunt        Cancer in blood   Kidney disease Father    Kidney disease Sister    Colon cancer Neg Hx    Colon polyps Neg Hx    Diabetes Neg Hx    Esophageal cancer Neg Hx    Gallbladder disease Neg Hx    Heart disease Neg Hx    Breast cancer Neg Hx     Social History Social History   Tobacco Use   Smoking status: Never   Smokeless tobacco: Never  Vaping Use   Vaping Use: Never used  Substance Use Topics   Alcohol use: No    Alcohol/week: 0.0 standard drinks of alcohol   Drug use: No  Allergies   Patient has no known allergies.   Review of Systems Review of Systems  Respiratory:  Positive for cough.   As per HPI   Physical Exam Triage Vital Signs ED Triage Vitals  Enc Vitals Group     BP 06/10/22 1142 117/73     Pulse Rate 06/10/22 1142 77     Resp 06/10/22 1142 12     Temp 06/10/22 1142 99.1 F (37.3 C)     Temp Source 06/10/22 1142 Oral     SpO2 06/10/22 1142 97 %     Weight --      Height --      Head Circumference --      Peak Flow --      Pain Score 06/10/22 1139 4     Pain Loc --      Pain Edu? --      Excl. in [place]? --    No data found.  Updated Vital Signs BP 117/73 (BP Location: Left Arm)   Pulse 77   Temp 99.1 F (37.3 C) (Oral)   Resp 12   LMP  (LMP Unknown) Comment: BTL  SpO2 97%   Visual Acuity Right Eye Distance:   Left Eye Distance:   Bilateral Distance:    Right Eye Near:   Left Eye Near:    Bilateral Near:     Physical Exam Vitals and nursing note reviewed.  Constitutional:      General: She is not in acute distress.    Appearance: She is well-developed. She is  ill-appearing. She is not toxic-appearing or diaphoretic.  HENT:     Head: Normocephalic and atraumatic.     Right Ear: Tympanic membrane normal. No drainage.     Left Ear: Tympanic membrane and ear canal normal. No drainage, swelling or tenderness.  No middle ear effusion. Tympanic membrane is not erythematous.     Ears:     Comments: Mild cerumen in R EAC    Nose: No congestion or rhinorrhea.     Mouth/Throat:     Mouth: Mucous membranes are moist. No oral lesions.     Pharynx: Oropharynx is clear. Uvula midline. No pharyngeal swelling, oropharyngeal exudate, posterior oropharyngeal erythema or uvula swelling.     Tonsils: No tonsillar exudate or tonsillar abscesses.  Eyes:     Conjunctiva/sclera: Conjunctivae normal.     Pupils: Pupils are equal, round, and reactive to light.  Neck:     Thyroid: No thyromegaly.  Cardiovascular:     Rate and Rhythm: Normal rate and regular rhythm.     Heart sounds: No murmur heard. Pulmonary:     Effort: Pulmonary effort is normal. No respiratory distress.     Breath sounds: Normal breath sounds.  Abdominal:     Palpations: Abdomen is soft.     Tenderness: There is no abdominal tenderness.  Musculoskeletal:        General: No swelling.     Cervical back: Normal range of motion and neck supple.  Lymphadenopathy:     Cervical: No cervical adenopathy.  Skin:    General: Skin is warm and dry.     Capillary Refill: Capillary refill takes less than 2 seconds.     Coloration: Skin is not pale.     Findings: No erythema or rash.  Neurological:     Mental Status: She is alert.  Psychiatric:        Mood and Affect: Mood normal.      UC Treatments /  Results  Labs (all labs ordered are listed, but only abnormal results are displayed) Labs Reviewed - No data to display  EKG   Radiology No results found.  Procedures Procedures (including critical care time)  Medications Ordered in UC Medications - No data to display  Initial  Impression / Assessment and Plan / UC Course  I have reviewed the triage vital signs and the nursing notes.  Pertinent labs & imaging results that were available during my care of the patient were reviewed by me and considered in my medical decision making (see chart for details).     Viral pharyngitis - pt has no clinical s/sx consistent with strep. Appears viral. Magic mouthwash encouraged for sx support. Influenza - given known positive exposure from daughter and shortage of testing supplies, will start treatment as pt is still within the 48 window of treatment. Tamiflu BID x 5 days.   Final Clinical Impressions(s) / UC Diagnoses   Final diagnoses:  Acute viral pharyngitis  Influenza     Discharge Instructions      Due to your confirmed exposure to influenza, and symptoms less than 48 hours, we will treat you as positive for flu.  There is no need to test.  Your throat is consistent with viral pharyngitis.  I have prescribed you a medication to help with the discomfort.  Additionally you may take ibuprofen or Tylenol to help with the pain.     ED Prescriptions     Medication Sig Dispense Auth. Provider   oseltamivir (TAMIFLU) 75 MG capsule Take 1 capsule (75 mg total) by mouth 2 (two) times daily for 5 days. 10 capsule Lexxie Winberg L, PA   magic mouthwash (lidocaine, diphenhydrAMINE, alum & mag hydroxide) suspension Swish and spit 10 mLs 4 (four) times daily as needed for mouth pain. 120 mL Araiya Tilmon L, PA      PDMP not reviewed this encounter.   Chaney Malling, [place] 06/10/22 1207

## 2022-06-10 NOTE — ED Triage Notes (Signed)
Pt is here for sore throat , cough, fatigue, headache, bilateral ear pain and chills x 3-4 days

## 2022-06-23 ENCOUNTER — Ambulatory Visit
Admission: RE | Admit: 2022-06-23 | Discharge: 2022-06-23 | Disposition: A | Discharge status: Home / Self Care | Payer: BC Managed Care – PPO | Source: Ambulatory Visit | Attending: Nurse Practitioner | Admitting: Nurse Practitioner

## 2022-06-23 ENCOUNTER — Ambulatory Visit
Admission: RE | Admit: 2022-06-23 | Discharge: 2022-06-23 | Disposition: A | Discharge status: Home / Self Care | Payer: Commercial Managed Care - PPO | Source: Ambulatory Visit | Attending: Nurse Practitioner | Admitting: Nurse Practitioner

## 2022-06-23 DIAGNOSIS — N644 Mastodynia: Secondary | ICD-10-CM

## 2022-09-18 ENCOUNTER — Ambulatory Visit (INDEPENDENT_AMBULATORY_CARE_PROVIDER_SITE_OTHER): Payer: BC Managed Care – PPO | Admitting: Obstetrics & Gynecology

## 2022-09-18 ENCOUNTER — Encounter: Payer: Self-pay | Admitting: Obstetrics & Gynecology

## 2022-09-18 VITALS — BP 100/64 | HR 62 | Ht 59.75 in | Wt 125.0 lb

## 2022-09-18 DIAGNOSIS — Z01419 Encounter for gynecological examination (general) (routine) without abnormal findings: Secondary | ICD-10-CM | POA: Diagnosis not present

## 2022-09-18 DIAGNOSIS — Z780 Asymptomatic menopausal state: Secondary | ICD-10-CM | POA: Diagnosis not present

## 2022-09-18 NOTE — Progress Notes (Signed)
Samantha Robertson [birth year] [id number]   History:    52 y.o.  G3P3L3 Married.  Status post tubal ligation.   RP:  Established patient presenting for annual gyn exam    HPI:  Postmenopause, well on no HRT.  No PMB. No pelvic pain. No pain with IC.  Pap Neg 08/2020.  No h/o abnormal Pap. Will repeat Pap at 3 years.  Urine/BMs normal. Breast normal.  Mammo Neg 06/2022.  Body mass index 24.62.  Needs to exercise more.  Fasting health labs with Fam MD.  Schedule Colono.   Past medical history,surgical history, family history and social history were all reviewed and documented in the EPIC chart.  Gynecologic History No LMP recorded (lmp unknown). Patient is postmenopausal.  Obstetric History OB History  Gravida Para Term Preterm AB Living  3 3 2 1   3   SAB IAB Ectopic Multiple Live Births          3    # Outcome Date GA Lbr Len/2nd Weight Sex Delivery Anes PTL Lv  3 Term     F CS-Unspec  N LIV  2 Term     F CS-Unspec  N LIV  1 Preterm     M Vag-Spont  Y LIV     ROS: A ROS was performed and pertinent positives and negatives are included in the history. GENERAL: No fevers or chills. HEENT: No change in vision, no earache, sore throat or sinus congestion. NECK: No pain or stiffness. CARDIOVASCULAR: No chest pain or pressure. No palpitations. PULMONARY: No shortness of breath, cough or wheeze. GASTROINTESTINAL: No abdominal pain, nausea, vomiting or diarrhea, melena or bright red blood per rectum. GENITOURINARY: No urinary frequency, urgency, hesitancy or dysuria. MUSCULOSKELETAL: No joint or muscle pain, no back pain, no recent trauma. DERMATOLOGIC: No rash, no itching, no lesions. ENDOCRINE: No polyuria, polydipsia, no heat or cold intolerance. No recent change in weight. HEMATOLOGICAL: No anemia or easy bruising or bleeding. NEUROLOGIC: No headache, seizures, numbness, tingling or weakness. PSYCHIATRIC: No depression, no loss of interest in normal activity or change in sleep pattern.      Exam:   BP 100/64   Pulse 62   Ht 4' 11.75" (1.518 m)   Wt 125 lb (56.7 kg)   LMP  (LMP Unknown) Comment: BTL, sexually active  SpO2 99%   BMI 24.62 kg/m   Body mass index is 24.62 kg/m.  General appearance : Well developed well nourished female. No acute distress HEENT: Eyes: no retinal hemorrhage or exudates,  Neck supple, trachea midline, no carotid bruits, no thyroidmegaly Lungs: Clear to auscultation, no rhonchi or wheezes, or rib retractions  Heart: Regular rate and rhythm, no murmurs or gallops Breast:Examined in sitting and supine position were symmetrical in appearance, no palpable masses or tenderness,  no skin retraction, no nipple inversion, no nipple discharge, no skin discoloration, no axillary or supraclavicular lymphadenopathy Abdomen: no palpable masses or tenderness, no rebound or guarding Extremities: no edema or skin discoloration or tenderness  Pelvic: Vulva: Normal             Vagina: No gross lesions or discharge  Cervix: No gross lesions or discharge  Uterus  AV, normal size, shape and consistency, non-tender and mobile  Adnexa  Without masses or tenderness  Anus: Normal   Assessment/Plan:  52 y.o. female for annual exam   1. Well female exam with routine gynecological exam Postmenopause, well on no HRT.  No PMB. No pelvic pain. No pain with IC.  Pap Neg 08/2020.  No h/o abnormal Pap. Will repeat Pap at 3 years.  Urine/BMs normal. Breast normal.  Mammo Neg 06/2022.  Body mass index 24.62.  Needs to exercise more.  Fasting health labs with Fam MD.  Schedule Colono.  2. Postmenopause  Postmenopause, well on no HRT.  No PMB. No pelvic pain. No pain with IC.   Genia Del MD, 4:06 PM

## 2022-12-09 ENCOUNTER — Encounter: Payer: BC Managed Care – PPO | Admitting: Emergency Medicine

## 2022-12-15 ENCOUNTER — Ambulatory Visit: Payer: BC Managed Care – PPO | Admitting: Emergency Medicine

## 2022-12-15 ENCOUNTER — Encounter: Payer: Self-pay | Admitting: Emergency Medicine

## 2022-12-15 VITALS — BP 110/72 | HR 55 | Temp 97.8°F | Ht 59.75 in | Wt 129.5 lb

## 2022-12-15 DIAGNOSIS — Z0000 Encounter for general adult medical examination without abnormal findings: Secondary | ICD-10-CM | POA: Diagnosis not present

## 2022-12-15 DIAGNOSIS — Z1329 Encounter for screening for other suspected endocrine disorder: Secondary | ICD-10-CM | POA: Diagnosis not present

## 2022-12-15 DIAGNOSIS — Z1159 Encounter for screening for other viral diseases: Secondary | ICD-10-CM

## 2022-12-15 DIAGNOSIS — Z130 Encounter for screening for diseases of the blood and blood-forming organs and certain disorders involving the immune mechanism: Secondary | ICD-10-CM

## 2022-12-15 DIAGNOSIS — Z13228 Encounter for screening for other metabolic disorders: Secondary | ICD-10-CM

## 2022-12-15 DIAGNOSIS — Z1211 Encounter for screening for malignant neoplasm of colon: Secondary | ICD-10-CM

## 2022-12-15 DIAGNOSIS — Z13220 Encounter for screening for lipoid disorders: Secondary | ICD-10-CM | POA: Diagnosis not present

## 2022-12-15 DIAGNOSIS — R7303 Prediabetes: Secondary | ICD-10-CM | POA: Diagnosis not present

## 2022-12-15 LAB — CBC WITH DIFFERENTIAL/PLATELET
Basophils Absolute: 0 10*3/uL (ref 0.0–0.1)
Basophils Relative: 0.9 % (ref 0.0–3.0)
Eosinophils Absolute: 0.1 10*3/uL (ref 0.0–0.7)
Eosinophils Relative: 1.7 % (ref 0.0–5.0)
HCT: 41.6 % (ref 36.0–46.0)
Hemoglobin: 13.8 g/dL (ref 12.0–15.0)
Lymphocytes Relative: 46.7 % — ABNORMAL HIGH (ref 12.0–46.0)
Lymphs Abs: 1.9 10*3/uL (ref 0.7–4.0)
MCHC: 33.1 g/dL (ref 30.0–36.0)
MCV: 94.6 fl (ref 78.0–100.0)
Monocytes Absolute: 0.3 10*3/uL (ref 0.1–1.0)
Monocytes Relative: 8.1 % (ref 3.0–12.0)
Neutro Abs: 1.7 10*3/uL (ref 1.4–7.7)
Neutrophils Relative %: 42.6 % — ABNORMAL LOW (ref 43.0–77.0)
Platelets: 227 10*3/uL (ref 150.0–400.0)
RBC: 4.4 Mil/uL (ref 3.87–5.11)
RDW: 13.8 % (ref 11.5–15.5)
WBC: 4 10*3/uL (ref 4.0–10.5)

## 2022-12-15 LAB — COMPREHENSIVE METABOLIC PANEL
ALT: 24 U/L (ref 0–35)
AST: 21 U/L (ref 0–37)
Albumin: 4.3 g/dL (ref 3.5–5.2)
Alkaline Phosphatase: 61 U/L (ref 39–117)
BUN: 15 mg/dL (ref 6–23)
CO2: 26 mEq/L (ref 19–32)
Calcium: 9.2 mg/dL (ref 8.4–10.5)
Chloride: 106 mEq/L (ref 96–112)
Creatinine, Ser: 0.78 mg/dL (ref 0.40–1.20)
GFR: 87.35 mL/min (ref >60.00)
Glucose, Bld: 104 mg/dL — ABNORMAL HIGH (ref 70–99)
Potassium: 4.4 mEq/L (ref 3.5–5.1)
Sodium: 139 mEq/L (ref 135–145)
Total Bilirubin: 0.6 mg/dL (ref 0.2–1.2)
Total Protein: 7 g/dL (ref 6.0–8.3)

## 2022-12-15 LAB — LIPID PANEL
Cholesterol: 171 mg/dL (ref 0–200)
HDL: 43.7 mg/dL (ref >39.00)
LDL Cholesterol: 105 mg/dL — ABNORMAL HIGH (ref 0–99)
NonHDL: 127.71
Total CHOL/HDL Ratio: 4
Triglycerides: 115 mg/dL (ref 0.0–149.0)
VLDL: 23 mg/dL (ref 0.0–40.0)

## 2022-12-15 LAB — HEMOGLOBIN A1C: Hgb A1c MFr Bld: 6.1 % (ref 4.6–6.5)

## 2022-12-15 NOTE — Patient Instructions (Signed)

## 2022-12-15 NOTE — Progress Notes (Signed)
Samantha Robertson 52 y.o.   Chief Complaint  Patient presents with   Annual Exam    No concerns     HISTORY OF PRESENT ILLNESS: This is a 52 y.o. female here for annual exam Last office visit August 2022 Complaining of occasional lower leg cramping No complaints or medical concerns today.  HPI   Prior to Admission medications   Not on File    No Known Allergies  Patient Active Problem List   Diagnosis Date Noted   Kidney stone on left side 12/11/2019   Osteoarthritis of left foot 01/21/2017   Varicose veins of lower extremities with other complications 03/02/2013   Vitamin D insufficiency 01/22/2012   ONYCHOMYCOSIS, TOENAILS 12/06/2009   DERMATOPHYTOSIS OF FOOT 12/06/2009   Prediabetes 07/11/2007    Past Medical History:  Diagnosis Date   Depression    Duodenal ulcer    Prediabetes    Vitamin D deficiency     Past Surgical History:  Procedure Laterality Date   CESAREAN SECTION  06/29/06    REPEAT C/S W BTSP   TUBAL LIGATION  06-29-06   AT TIME OF REPEAT C/S    Social History   Socioeconomic History   Marital status: Married    Spouse name: Not on file   Number of children: 3   Years of education: Not on file   Highest education level: Not on file  Occupational History   Occupation: Psychiatric nurse  Tobacco Use   Smoking status: Never   Smokeless tobacco: Never  Vaping Use   Vaping Use: Never used  Substance and Sexual Activity   Alcohol use: No    Alcohol/week: 0.0 standard drinks of alcohol   Drug use: No   Sexual activity: Yes    Partners: Male    Birth control/protection: Surgical, Post-menopausal    Comment: 1st intercourse- 19, partners- 1, btl  Other Topics Concern   Not on file  Social History Narrative   Not on file   Social Determinants of Health   Financial Resource Strain: Not on file  Food Insecurity: Not on file  Transportation Needs: Not on file  Physical Activity: Not on file  Stress: Not on file  Social Connections:  Not on file  Intimate Partner Violence: Not on file    Family History  Problem Relation Age of Onset   Other Maternal Aunt        Cancer in blood   Kidney disease Father    Kidney disease Sister    Colon cancer Neg Hx    Colon polyps Neg Hx    Diabetes Neg Hx    Esophageal cancer Neg Hx    Gallbladder disease Neg Hx    Heart disease Neg Hx    Breast cancer Neg Hx      Review of Systems  Constitutional: Negative.  Negative for chills and fever.  HENT: Negative.  Negative for congestion and sore throat.   Respiratory: Negative.  Negative for cough and shortness of breath.   Cardiovascular: Negative.  Negative for chest pain and palpitations.  Gastrointestinal:  Negative for abdominal pain, diarrhea, nausea and vomiting.  Genitourinary: Negative.  Negative for dysuria and hematuria.  Skin: Negative.  Negative for rash.  Neurological: Negative.  Negative for dizziness and headaches.  All other systems reviewed and are negative.   Vitals:   12/15/22 0804  BP: 110/72  Pulse: (!) 55  Temp: 97.8 F (36.6 C)  SpO2: 99%    Physical Exam Vitals reviewed.  Constitutional:      Appearance: Normal appearance.  HENT:     Head: Normocephalic.     Right Ear: Tympanic membrane, ear canal and external ear normal.     Left Ear: Tympanic membrane, ear canal and external ear normal.     Mouth/Throat:     Mouth: Mucous membranes are moist.     Pharynx: Oropharynx is clear.  Eyes:     Extraocular Movements: Extraocular movements intact.     Conjunctiva/sclera: Conjunctivae normal.     Pupils: Pupils are equal, round, and reactive to light.  Cardiovascular:     Rate and Rhythm: Normal rate and regular rhythm.     Pulses: Normal pulses.     Heart sounds: Normal heart sounds.  Pulmonary:     Effort: Pulmonary effort is normal.     Breath sounds: Normal breath sounds.  Abdominal:     Palpations: Abdomen is soft.     Tenderness: There is no abdominal tenderness.  Musculoskeletal:      Cervical back: No tenderness.     Right lower leg: No edema.     Left lower leg: No edema.  Lymphadenopathy:     Cervical: No cervical adenopathy.  Skin:    General: Skin is warm and dry.     Capillary Refill: Capillary refill takes less than 2 seconds.  Neurological:     General: No focal deficit present.     Mental Status: She is alert and oriented to person, place, and time.  Psychiatric:        Mood and Affect: Mood normal.        Behavior: Behavior normal.      ASSESSMENT & PLAN: Problem List Items Addressed This Visit       Other   Prediabetes   Other Visit Diagnoses     Routine general medical examination at a health care facility    -  Primary   Relevant Orders   CBC with Differential   Comprehensive metabolic panel   Hemoglobin A1c   Lipid panel   Hepatitis C antibody screen   Ambulatory referral to Gastroenterology   Need for hepatitis C screening test       Relevant Orders   Hepatitis C antibody screen   Colon cancer screening       Relevant Orders   Ambulatory referral to Gastroenterology   Screening for deficiency anemia       Relevant Orders   CBC with Differential   Screening for lipoid disorders       Relevant Orders   Lipid panel   Screening for endocrine, metabolic and immunity disorder       Relevant Orders   Comprehensive metabolic panel   Hemoglobin A1c      Modifiable risk factors discussed with patient. Anticipatory guidance according to age provided. The following topics were also discussed: Social Determinants of Health Smoking.  Non-smoker Diet and nutrition Benefits of exercise Cancer screening and need for colon cancer screening with colonoscopy Vaccinations reviewed and recommendations Cardiovascular risk assessment and need for blood work Mental health including depression and anxiety Fall and accident prevention  Patient Instructions  Mantenimiento de Radiographer, therapeutic en las mujeres Health Maintenance, Female Adoptar un  estilo de vida saludable y recibir atencin preventiva son importantes para promover la salud y Counsellor. Consulte al mdico sobre: El esquema adecuado para hacerse pruebas y exmenes peridicos. Cosas que puede hacer por su cuenta para prevenir enfermedades y [place] sano. Qu debo saber sobre  la dieta, el peso y el ejercicio? Consuma una dieta saludable  Consuma una dieta que incluya muchas verduras, frutas, productos lcteos con bajo contenido de [place] y Associate Professor. No consuma muchos alimentos ricos en grasas slidas, azcares agregados o sodio. Mantenga un peso saludable El ndice de masa muscular [facility]) se [place] para identificar problemas de [place]. Proporciona una estimacin de la grasa corporal basndose en el peso y la altura. Su mdico puede ayudarle a Engineer, site [facility] y a Personnel officer o Pharmacologist un peso saludable. Haga ejercicio con regularidad Haga ejercicio con regularidad. Esta es una de las prcticas ms importantes que puede hacer por su salud. La [employer] de los adultos deben seguir estas pautas: Education officer, environmental, al menos, 150 minutos de actividad fsica por semana. El ejercicio debe aumentar la frecuencia cardaca y Media planner transpirar (ejercicio de intensidad moderada). Hacer ejercicios de fortalecimiento por lo [employer] por semana. Agregue esto a su plan de ejercicio de intensidad moderada. Pase menos tiempo sentada. Incluso la actividad fsica ligera puede ser beneficiosa. Controle sus niveles de colesterol y lpidos en la sangre Comience a realizarse anlisis de lpidos y Oncologist en la sangre a los 20 aos y luego reptalos cada 5 aos. Hgase controlar los niveles de colesterol con mayor frecuencia si: Sus niveles de lpidos y colesterol son altos. Es mayor de 40 aos. Presenta un alto riesgo de padecer enfermedades cardacas. Qu debo saber sobre las pruebas de deteccin del cncer? Segn su historia clnica y sus antecedentes familiares, es posible que deba  realizarse pruebas de deteccin del cncer en diferentes edades. Esto puede incluir pruebas de deteccin de lo siguiente: Cncer de mama. Cncer de cuello uterino. Cncer colorrectal. Cncer de piel. Cncer de pulmn. Qu debo saber sobre la enfermedad cardaca, la diabetes y la hipertensin arterial? Presin arterial y enfermedad cardaca La hipertensin arterial causa enfermedades cardacas y [place] el riesgo de accidente cerebrovascular. Es ms probable que esto se manifieste en las personas que tienen lecturas de presin arterial alta o tienen sobrepeso. Hgase controlar la presin arterial: Cada 3 a 5 aos si tiene entre 18 y 64 aos. Todos los aos si es mayor de 40 aos. Diabetes Realcese exmenes de deteccin de la diabetes con regularidad. Este anlisis revisa el nivel de azcar en la sangre en [place]. Hgase las pruebas de deteccin: Cada tres aos despus de los 40 aos de edad si tiene un peso normal y un bajo riesgo de padecer diabetes. Con ms frecuencia y a partir de [place] edad inferior si tiene sobrepeso o un alto riesgo de padecer diabetes. Qu debo saber sobre la prevencin de infecciones? Hepatitis B Si tiene un riesgo ms alto de contraer hepatitis B, debe someterse a un examen de deteccin de este virus. Hable con el mdico para averiguar si tiene riesgo de contraer la infeccin por hepatitis B. Hepatitis C Se recomienda el anlisis a: [employer] 1945 y 1965. Todas las personas que tengan un riesgo de haber contrado hepatitis C. Enfermedades de transmisin sexual (ETS) Hgase las pruebas de Airline pilot de ITS, incluidas la gonorrea y la clamidia, si: Es sexualmente activa y es menor de [address]. Es mayor de [address], y Public affairs consultant informa que corre riesgo de tener este tipo de infecciones. La actividad sexual ha cambiado desde que le hicieron la ltima prueba de deteccin y tiene un riesgo mayor de Warehouse manager clamidia o Copy. Pregntele al mdico si  usted tiene riesgo. Pregntele al mdico si usted tiene un  alto riesgo de Primary school teacher VIH. El mdico tambin puede recomendarle un medicamento recetado para ayudar a evitar la infeccin por el VIH. Si elige tomar medicamentos para prevenir el VIH, primero debe [employer] de deteccin del VIH. Luego debe hacerse anlisis cada 3 meses mientras est tomando los medicamentos. Embarazo Si est por dejar de Armed forces training and education officer (fase premenopusica) y usted puede quedar [place], busque asesoramiento antes de [place]. Tome de 400 a 800 microgramos (mcg) de cido [employer] si [place]. Pida mtodos de control de la natalidad (anticonceptivos) si desea evitar un embarazo no deseado. Osteoporosis y [place] La osteoporosis es una enfermedad en la que los huesos pierden los minerales y la fuerza por el avance de la edad. El resultado pueden ser fracturas en los [place]. Si tiene 65 aos o ms, o si est en riesgo de sufrir osteoporosis y fracturas, pregunte a su mdico si debe: Hacerse pruebas de deteccin de prdida sea. Tomar un suplemento de calcio o de vitamina D para reducir el riesgo de fracturas. Recibir terapia de reemplazo hormonal (TRH) para tratar los sntomas de la menopausia. Siga estas indicaciones en su casa: Consumo de alcohol No beba alcohol si: Su mdico le indica no hacerlo. Est embarazada, puede estar embarazada o est tratando de [place]. Si bebe alcohol: Limite la cantidad que bebe a lo siguiente: De 0 a 1 bebida por da. Sepa cunta cantidad de alcohol hay en las bebidas que toma. En los [address], una medida equivale a una botella de cerveza de 12 oz (355 ml), un vaso de vino de 5 oz (148 ml) o un vaso de una bebida alcohlica de alta graduacin de 1 oz (44 ml). Estilo de vida No consuma ningn producto que contenga nicotina o tabaco. Estos productos incluyen cigarrillos, tabaco para Theatre manager y aparatos de vapeo, como los Soil scientist. Si necesita ayuda para dejar de consumir estos productos, consulte al mdico. No consuma drogas. No comparta agujas. Solicite ayuda a su mdico si necesita apoyo o informacin para abandonar las drogas. Indicaciones generales Realcese los estudios de rutina de [address], dentales y de Wellsite geologist. Mantngase al da con las vacunas. Infrmele a su mdico si: Se siente deprimida con frecuencia. Alguna vez ha sido vctima de [place] o no se siente seguro en su casa. Resumen Adoptar un estilo de vida saludable y recibir atencin preventiva son importantes para promover la salud y Counsellor. Siga las instrucciones del mdico acerca de una dieta saludable, el ejercicio y la realizacin de pruebas o exmenes para Hotel manager. Siga las instrucciones del mdico con respecto al control del colesterol y la presin arterial. Esta informacin no tiene Theme park manager el consejo del mdico. Asegrese de hacerle al mdico cualquier pregunta que tenga. Document Revised: 11/07/2020 Document Reviewed: 11/07/2020 Elsevier Patient Education  2024 Elsevier Inc.      Edwina Barth, MD [facility]

## 2022-12-16 LAB — HEPATITIS C ANTIBODY: Hepatitis C Ab: NONREACTIVE

## 2023-04-27 ENCOUNTER — Ambulatory Visit (INDEPENDENT_AMBULATORY_CARE_PROVIDER_SITE_OTHER): Payer: BC Managed Care – PPO | Admitting: Emergency Medicine

## 2023-04-27 ENCOUNTER — Encounter: Payer: Self-pay | Admitting: Emergency Medicine

## 2023-04-27 VITALS — BP 110/80 | HR 76 | Temp 98.6°F | Ht 59.75 in | Wt 132.0 lb

## 2023-04-27 DIAGNOSIS — R1084 Generalized abdominal pain: Secondary | ICD-10-CM | POA: Insufficient documentation

## 2023-04-27 DIAGNOSIS — Z1211 Encounter for screening for malignant neoplasm of colon: Secondary | ICD-10-CM

## 2023-04-27 DIAGNOSIS — Z23 Encounter for immunization: Secondary | ICD-10-CM

## 2023-04-27 LAB — COMPREHENSIVE METABOLIC PANEL
ALT: 40 U/L — ABNORMAL HIGH (ref 0–35)
AST: 27 U/L (ref 0–37)
Albumin: 4.5 g/dL (ref 3.5–5.2)
Alkaline Phosphatase: 58 U/L (ref 39–117)
BUN: 13 mg/dL (ref 6–23)
CO2: 28 meq/L (ref 19–32)
Calcium: 9.1 mg/dL (ref 8.4–10.5)
Chloride: 105 meq/L (ref 96–112)
Creatinine, Ser: 0.75 mg/dL (ref 0.40–1.20)
GFR: 91.32 mL/min (ref >60.00)
Glucose, Bld: 119 mg/dL — ABNORMAL HIGH (ref 70–99)
Potassium: 4 meq/L (ref 3.5–5.1)
Sodium: 139 meq/L (ref 135–145)
Total Bilirubin: 0.5 mg/dL (ref 0.2–1.2)
Total Protein: 7 g/dL (ref 6.0–8.3)

## 2023-04-27 LAB — HEMOGLOBIN A1C: Hgb A1c MFr Bld: 6.3 % (ref 4.6–6.5)

## 2023-04-27 LAB — LIPID PANEL
Cholesterol: 177 mg/dL (ref 0–200)
HDL: 39.1 mg/dL (ref >39.00)
LDL Cholesterol: 98 mg/dL (ref 0–99)
NonHDL: 138.15
Total CHOL/HDL Ratio: 5
Triglycerides: 203 mg/dL — ABNORMAL HIGH (ref 0.0–149.0)
VLDL: 40.6 mg/dL — ABNORMAL HIGH (ref 0.0–40.0)

## 2023-04-27 LAB — LIPASE: Lipase: 35 U/L (ref 11.0–59.0)

## 2023-04-27 NOTE — Progress Notes (Signed)
Samantha Robertson 52 y.o.   Chief Complaint  Patient presents with   Abdominal Pain    Patient states she is having some left upper abd pain     HISTORY OF PRESENT ILLNESS: This is a 52 y.o. female complaining of left-sided abdominal pain on and off for the past couple months Has seen small amounts of blood in the feces. Able to eat and drink.  Denies nausea or vomiting.  Denies unintentional weight loss.  No family history of colon cancer. Denies fever or chills or any other associated symptoms. Denies chronic constipation or chronic diarrhea.  Abdominal Pain Pertinent negatives include no constipation, diarrhea, dysuria, headaches, hematuria, nausea or vomiting.     Prior to Admission medications   Not on File    No Known Allergies  Patient Active Problem List   Diagnosis Date Noted   Kidney stone on left side 12/11/2019   Osteoarthritis of left foot 01/21/2017   Varicose veins of bilateral lower extremities with other complications 03/02/2013   Vitamin D insufficiency 01/22/2012   ONYCHOMYCOSIS, TOENAILS 12/06/2009   DERMATOPHYTOSIS OF FOOT 12/06/2009   Prediabetes 07/11/2007    Past Medical History:  Diagnosis Date   Depression    Duodenal ulcer    Prediabetes    Vitamin D deficiency     Past Surgical History:  Procedure Laterality Date   CESAREAN SECTION  06/29/06    REPEAT C/S W BTSP   TUBAL LIGATION  06-29-06   AT TIME OF REPEAT C/S    Social History   Socioeconomic History   Marital status: Married    Spouse name: Not on file   Number of children: 3   Years of education: Not on file   Highest education level: Not on file  Occupational History   Occupation: Psychiatric nurse  Tobacco Use   Smoking status: Never   Smokeless tobacco: Never  Vaping Use   Vaping status: Never Used  Substance and Sexual Activity   Alcohol use: No    Alcohol/week: 0.0 standard drinks of alcohol   Drug use: No   Sexual activity: Yes    Partners: Male    Birth  control/protection: Surgical, Post-menopausal    Comment: 1st intercourse- 19, partners- 1, btl  Other Topics Concern   Not on file  Social History Narrative   Not on file   Social Determinants of Health   Financial Resource Strain: Not on file  Food Insecurity: Not on file  Transportation Needs: Not on file  Physical Activity: Not on file  Stress: Not on file  Social Connections: Not on file  Intimate Partner Violence: Not on file    Family History  Problem Relation Age of Onset   Other Maternal Aunt        Cancer in blood   Kidney disease Father    Kidney disease Sister    Colon cancer Neg Hx    Colon polyps Neg Hx    Diabetes Neg Hx    Esophageal cancer Neg Hx    Gallbladder disease Neg Hx    Heart disease Neg Hx    Breast cancer Neg Hx      Review of Systems  Constitutional: Negative.   HENT: Negative.  Negative for congestion and sore throat.   Respiratory: Negative.  Negative for cough and shortness of breath.   Cardiovascular: Negative.  Negative for chest pain and palpitations.  Gastrointestinal:  Positive for abdominal pain and blood in stool. Negative for constipation, diarrhea, nausea and vomiting.  Genitourinary: Negative.  Negative for dysuria and hematuria.  Skin: Negative.  Negative for rash.  Neurological:  Negative for dizziness and headaches.  All other systems reviewed and are negative.   Vitals:   04/27/23 1422  BP: 110/80  Pulse: 76  Temp: 98.6 F (37 C)    Physical Exam Vitals reviewed.  Constitutional:      Appearance: She is well-developed.  HENT:     Head: Normocephalic.     Mouth/Throat:     Mouth: Mucous membranes are moist.     Pharynx: Oropharynx is clear.  Eyes:     Extraocular Movements: Extraocular movements intact.     Conjunctiva/sclera: Conjunctivae normal.     Pupils: Pupils are equal, round, and reactive to light.  Cardiovascular:     Rate and Rhythm: Normal rate and regular rhythm.     Pulses: Normal pulses.      Heart sounds: Normal heart sounds.  Pulmonary:     Effort: Pulmonary effort is normal.     Breath sounds: Normal breath sounds.  Abdominal:     Palpations: Abdomen is soft.     Tenderness: There is no abdominal tenderness.  Musculoskeletal:     Cervical back: No tenderness.  Lymphadenopathy:     Cervical: No cervical adenopathy.  Skin:    General: Skin is warm and dry.     Capillary Refill: Capillary refill takes less than 2 seconds.  Neurological:     General: No focal deficit present.     Mental Status: She is alert and oriented to person, place, and time.  Psychiatric:        Mood and Affect: Mood normal.        Behavior: Behavior normal.      ASSESSMENT & PLAN: A total of 42 minutes was spent with the patient and counseling/coordination of care regarding preparing for this visit, review of most recent office visit notes, review of most recent blood work results, differential diagnosis of abdominal pain and need for workup including CT scan of abdomen and pelvis, blood work, and GI evaluation and possible colonoscopy, pain management, education on nutrition, ED precautions, prognosis, documentation and need for follow-up  Problem List Items Addressed This Visit       Other   Generalized abdominal pain - Primary    Clinically stable.  No red flag signs or symptoms. Benign abdominal examination.  Afebrile. Differential diagnosis discussed.  Needs workup. Recommend blood work and urinalysis today Recommend CT abdomen and pelvis Needs GI evaluation.  Referral placed today Diet and nutrition discussed Pain management discussed.  May take Tylenol as needed for pain Advised to stay well-hydrated ED precautions given Advised to contact the office if no better or worse during the next several days.      Relevant Orders   Ambulatory referral to Gastroenterology   CT ABDOMEN PELVIS W WO CONTRAST   Urinalysis   CBC with Differential/Platelet   Comprehensive metabolic panel    Hemoglobin A1c   Lipid panel   Lipase   Other Visit Diagnoses     Need for vaccination       Relevant Orders   Flu vaccine trivalent PF, 6mos and older(Flulaval,Afluria,Fluarix,Fluzone)   Screening for colon cancer       Relevant Orders   Ambulatory referral to Gastroenterology      Patient Instructions  Dolor abdominal en los adultos Abdominal Pain, Adult  El dolor de [place] (abdominal) puede tener muchas causas. En la [employer], [place]  dolor de estmago no es un problema grave y puede controlarse y tratarse Facilities manager. Pero en [employer], puede ser grave. El mdico intentar descubrir la causa del dolor de [place]. Siga estas instrucciones en su casa: Medicamentos [employer] de venta libre y los recetados solamente como se lo haya indicado el mdico. No tome medicamentos que lo ayuden a Advertising copywriter (laxantes), salvo que el mdico se lo indique. Instrucciones generales Est atento al dolor de estmago para Insurance risk surveyor cambio. Informe al mdico si el dolor empeora. Beber suficiente lquido para Radio producer pis (orina) de color amarillo plido. Comunquese con un mdico si: El dolor de [address] cambia o [place]. Tiene clicos muy intensos o mucha distensin en el vientre. Vomita. El dolor empeora con las comidas, despus de comer o con determinados alimentos. Tiene dificultades para defecar o produce heces lquidas durante ms de 2 o 3 das. No tiene apetito o baja de peso sin proponrselo. Presenta signos de no tener suficientes lquido o agua en el cuerpo (deshidratacin). Pueden incluir: [place], muy escasa o falta de [place]. Labios agrietados o Building surveyor. Somnolencia o debilidad. Siente dolor al orinar o defecar. El dolor de estmago lo despierta de noche. Observa sangre en la orina. Tiene fiebre. Solicite ayuda de inmediato si: No puede dejar de vomitar. Siente Chief Technology Officer en una sola parte del vientre, por ejemplo, en el lado  derecho. Tiene heces con sangre, de color negro o con aspecto alquitranado. Tiene dificultad para respirar. Tiene dolor en el pecho. Estos sntomas pueden Customer service manager. Solicite ayuda de inmediato. Llame al 911. No espere a ver si los sntomas desaparecen. No conduzca por sus propios medios [employer]. Esta informacin no tiene Theme park manager el consejo del mdico. Asegrese de hacerle al mdico cualquier pregunta que tenga. Document Revised: 07/11/2022 Document Reviewed: 07/11/2022 Elsevier Patient Education  2024 Elsevier Inc.     Edwina Barth, MD [facility]

## 2023-04-27 NOTE — Patient Instructions (Signed)
Dolor abdominal en los adultos Abdominal Pain, Adult  El dolor de [place] (abdominal) puede tener muchas causas. En la [employer], el dolor de [place] no es un problema grave y puede controlarse y Scientist, clinical (histocompatibility and immunogenetics). Pero en [employer], puede ser grave. El mdico intentar descubrir la causa del dolor de [place]. Siga estas instrucciones en su casa: Medicamentos [employer] de venta libre y los recetados solamente como se lo haya indicado el mdico. No tome medicamentos que lo ayuden a Advertising copywriter (laxantes), salvo que el mdico se lo indique. Instrucciones generales Est atento al dolor de estmago para Insurance risk surveyor cambio. Informe al mdico si el dolor empeora. Beber suficiente lquido para Radio producer pis (orina) de color amarillo plido. Comunquese con un mdico si: El dolor de [address] cambia o [place]. Tiene clicos muy intensos o mucha distensin en el vientre. Vomita. El dolor empeora con las comidas, despus de comer o con determinados alimentos. Tiene dificultades para defecar o produce heces lquidas durante ms de 2 o 3 das. No tiene apetito o baja de peso sin proponrselo. Presenta signos de no tener suficientes lquido o agua en el cuerpo (deshidratacin). Pueden incluir: [place], muy escasa o falta de [place]. Labios agrietados o Building surveyor. Somnolencia o debilidad. Siente dolor al orinar o defecar. El dolor de estmago lo despierta de noche. Observa sangre en la orina. Tiene fiebre. Solicite ayuda de inmediato si: No puede dejar de vomitar. Siente Chief Technology Officer en una sola parte del vientre, por ejemplo, en el lado derecho. Tiene heces con sangre, de color negro o con aspecto alquitranado. Tiene dificultad para respirar. Tiene dolor en el pecho. Estos sntomas pueden Customer service manager. Solicite ayuda de inmediato. Llame al 911. No espere a ver si los sntomas desaparecen. No conduzca por sus propios medios [employer]. Esta  informacin no tiene Theme park manager el consejo del mdico. Asegrese de hacerle al mdico cualquier pregunta que tenga. Document Revised: 07/11/2022 Document Reviewed: 07/11/2022 Elsevier Patient Education  2024 [employer].

## 2023-04-27 NOTE — Assessment & Plan Note (Signed)
Clinically stable.  No red flag signs or symptoms. Benign abdominal examination.  Afebrile. Differential diagnosis discussed.  Needs workup. Recommend blood work and urinalysis today Recommend CT abdomen and pelvis Needs GI evaluation.  Referral placed today Diet and nutrition discussed Pain management discussed.  May take Tylenol as needed for pain Advised to stay well-hydrated ED precautions given Advised to contact the office if no better or worse during the next several days.

## 2023-04-28 LAB — URINALYSIS
Bilirubin Urine: NEGATIVE
Hgb urine dipstick: NEGATIVE
Ketones, ur: NEGATIVE
Leukocytes,Ua: NEGATIVE
Nitrite: NEGATIVE
Specific Gravity, Urine: 1.01 (ref 1.000–1.030)
Total Protein, Urine: NEGATIVE
Urine Glucose: NEGATIVE
Urobilinogen, UA: 0.2 (ref 0.0–1.0)
pH: 6.5 (ref 5.0–8.0)

## 2023-04-28 LAB — CBC WITH DIFFERENTIAL/PLATELET
Basophils Absolute: 0 10*3/uL (ref 0.0–0.1)
Basophils Relative: 0.8 % (ref 0.0–3.0)
Eosinophils Absolute: 0.1 10*3/uL (ref 0.0–0.7)
Eosinophils Relative: 1.9 % (ref 0.0–5.0)
HCT: 39.8 % (ref 36.0–46.0)
Hemoglobin: 13.5 g/dL (ref 12.0–15.0)
Lymphocytes Relative: 40.7 % (ref 12.0–46.0)
Lymphs Abs: 2 10*3/uL (ref 0.7–4.0)
MCHC: 33.8 g/dL (ref 30.0–36.0)
MCV: 95.9 fL (ref 78.0–100.0)
Monocytes Absolute: 0.4 10*3/uL (ref 0.1–1.0)
Monocytes Relative: 7.7 % (ref 3.0–12.0)
Neutro Abs: 2.4 10*3/uL (ref 1.4–7.7)
Neutrophils Relative %: 48.9 % (ref 43.0–77.0)
Platelets: 268 10*3/uL (ref 150.0–400.0)
RBC: 4.15 Mil/uL (ref 3.87–5.11)
RDW: 13.9 % (ref 11.5–15.5)
WBC: 4.9 10*3/uL (ref 4.0–10.5)

## 2023-05-07 ENCOUNTER — Encounter: Payer: Self-pay | Admitting: Gastroenterology

## 2023-07-28 ENCOUNTER — Other Ambulatory Visit (INDEPENDENT_AMBULATORY_CARE_PROVIDER_SITE_OTHER): Payer: BC Managed Care – PPO

## 2023-07-28 ENCOUNTER — Ambulatory Visit (INDEPENDENT_AMBULATORY_CARE_PROVIDER_SITE_OTHER): Payer: BC Managed Care – PPO | Admitting: Gastroenterology

## 2023-07-28 ENCOUNTER — Encounter: Payer: Self-pay | Admitting: Gastroenterology

## 2023-07-28 VITALS — BP 108/64 | HR 69 | Ht 59.0 in | Wt 129.0 lb

## 2023-07-28 DIAGNOSIS — R1012 Left upper quadrant pain: Secondary | ICD-10-CM

## 2023-07-28 DIAGNOSIS — R1032 Left lower quadrant pain: Secondary | ICD-10-CM | POA: Insufficient documentation

## 2023-07-28 DIAGNOSIS — R1013 Epigastric pain: Secondary | ICD-10-CM | POA: Diagnosis not present

## 2023-07-28 DIAGNOSIS — R109 Unspecified abdominal pain: Secondary | ICD-10-CM

## 2023-07-28 LAB — BASIC METABOLIC PANEL
BUN: 13 mg/dL (ref 6–23)
CO2: 30 meq/L (ref 19–32)
Calcium: 9.4 mg/dL (ref 8.4–10.5)
Chloride: 103 meq/L (ref 96–112)
Creatinine, Ser: 0.73 mg/dL (ref 0.40–1.20)
GFR: 94.17 mL/min (ref >60.00)
Glucose, Bld: 105 mg/dL — ABNORMAL HIGH (ref 70–99)
Potassium: 4.1 meq/L (ref 3.5–5.1)
Sodium: 142 meq/L (ref 135–145)

## 2023-07-28 NOTE — Progress Notes (Signed)
Agree with assessment and plan as outlined.

## 2023-07-28 NOTE — Progress Notes (Signed)
07/28/2023 Scharlene Gloss [id number] [birth year]   HISTORY OF PRESENT ILLNESS:  This is a 53 year old female who is a patient of Dr. Lanetta Inch.  She is here today with complaints of abdominal pain.  Complains of left-sided abdominal pain and also a mid epigastric abdominal pain.  Really no other associated symptoms.  She denies nausea and vomiting.  Says that she gets occasional diarrhea if she eats something greasy, but that is not frequent.  Denies any rectal bleeding.  Denies any heartburn or reflux.  Appears that she had the same complaints back in 2021 when she saw Dr. Adela Lank, but does not recall.  Tells me that these pains have been present for about 5 months.  She has had EGD in the past.  Has had gastric emptying scan.  Previous H. pylori breath test was negative.  Had a negative Cologuard in August 2022.  She describes some of the pain being worse with exercise, lifting, etc.  Describes some of the pain as burning and uncomfortable after eating.  She had H pylori  Breath test negative 01/26/19    EGD 10/25/2014 - suuperficial duodenal ulcer, otherwise normal exam - focal increase in intraepithelial lymphocytes.   EGD 04/26/2008 - normal exam   GES 08/03/2014 - normal    US abdomen 01/20/2012 - normal exam  Cologuard 01/2021:  Negative.  Patient is primarily Spanish-speaking so the entire visit was performed via interpreter/translator.  Was referred here by her PCP, Dr. Alvy Bimler, for evaluation of generalized abdominal pain.  Past Medical History:  Diagnosis Date   Depression    Duodenal ulcer    Prediabetes    Vitamin D deficiency    Past Surgical History:  Procedure Laterality Date   CESAREAN SECTION  06/29/06    REPEAT C/S W BTSP   TUBAL LIGATION  06-29-06   AT TIME OF REPEAT C/S    reports that she has never smoked. She has never used smokeless tobacco. She reports that she does not drink alcohol and does not use drugs. family history includes Kidney disease  in her father and sister; Other in her maternal aunt. No Known Allergies    No outpatient encounter medications on file as of 07/28/2023.   No facility-administered encounter medications on file as of 07/28/2023.    REVIEW OF SYSTEMS  : All other systems reviewed and negative except where noted in the History of Present Illness.   PHYSICAL EXAM: BP 108/64   Pulse 69   Ht 4\' 11"  (1.499 m)   Wt 129 lb (58.5 kg)   LMP  (LMP Unknown) Comment: BTL, sexually active  BMI 26.05 kg/m  General: Well developed female in no acute distress Head: Normocephalic and atraumatic Eyes:  Sclerae anicteric, conjunctiva pink. Ears: Normal auditory acuity Lungs: Clear throughout to auscultation; no W/R/R. Heart: Regular rate and rhythm; no M/R/G. Abdomen: Soft, non-distended.  BS present.  Non-tender. Musculoskeletal: Symmetrical with no gross deformities  Skin: No lesions on visible extremities Neurological: Alert oriented x 4, grossly non-focal Psychological:  Alert and cooperative. Normal mood and affect  ASSESSMENT AND PLAN: *53 year old female with complaints of abdominal pain.  Complains of left-sided abdominal pain and also a mid epigastric abdominal pain.  Really no other associated symptoms.  Appears that she had the same complaints back in 2021 when she saw Dr. Adela Lank, but does not recall.  She has had EGD in the past.  Has had gastric emptying scan.  Previous H. pylori breath test was negative.  Had a negative Cologuard in August 2022.  Will need either repeat Cologuard or colonoscopy around August or September.  Will check a CT scan of the abdomen and pelvis with contrast to rule out gastrointestinal issues.  Otherwise these could be musculoskeletal as she describes some of the pain being worse with exercise, lifting, etc.  Describes some of the pain as burning and uncomfortable after eating.  If CT scans unremarkable then could try daily PPI for a while.   CC:  Georgina Quint,  [place]

## 2023-07-28 NOTE — Patient Instructions (Addendum)
Su proveedor le ha solicitado que vaya al stano para Education officer, environmental anlisis de laboratorio antes de irse hoy. Presione "B" en el ascensor. El laboratorio est ubicado en la primera puerta a la izquierda al salir del Medical sales representative.   Se le ha programado una tomografa computarizada del abdomen y la pelvis en el primer piso de [facility]. Ests programado para Cherrie Distance 18/02/25 a las 13:30 horas. Debe llegar 15 minutos antes de la hora de su cita para registrarse.    Siga las instrucciones escritas a Sport and exercise psychologist de su examen:   1) No comas nada despus de las 11:30 am (4 horas antes de tu prueba)  Puede tomar cualquier medicamento segn lo prescrito con una pequea cantidad de agua, si es necesario. Si toma alguno de los siguientes medicamentos: METFORMIN, GLUCOPHAGE, GLUCOVANCE, AVANDAMET, RIOMET, FORTAMET, ACTOPLUS MET, JANUMET, GLUMETZA o METAGLIP, ES POSIBLE que se le solicite que SUSPENDA este medicamento 48 horas DESPUS del examen.   El propsito de que usted beba el contraste oral es ayudar en la visualizacin de su tracto intestinal. La solucin de contraste puede causar algo de diarrea. Dependiendo de su conjunto individual de sntomas, es posible que tambin reciba una inyeccin intravenosa de contraste/tinte para rayos X. Planee estar en [facility] [place] durante 45 minutos o ms, segn el tipo de examen que vaya a Education officer, environmental.   Si tiene [employer] su examen o si necesita reprogramarlo, puede llamar a [facility] al [phone] [place] 8:00 am y las 5:00 pm, de lunes a viernes.  __________________________________________________________________  Your provider has requested that you go to the basement level for lab work before leaving today. Press "B" on the elevator. The lab is located at the first door on the left as you exit the elevator.   You have been scheduled for a CT scan of the abdomen and pelvis at [facility], 1st floor  Radiology. You are scheduled on Tuesday 08/03/23 at 1:30 pm. You should arrive 15 minutes prior to your appointment time for registration.    Please follow the written instructions below on the day of your exam:   1) Do not eat anything after 11:30 am (4 hours prior to your test)    You may take any medications as prescribed with a small amount of water, if necessary. If you take any of the following medications: METFORMIN, GLUCOPHAGE, GLUCOVANCE, AVANDAMET, RIOMET, FORTAMET, ACTOPLUS MET, JANUMET, GLUMETZA or METAGLIP, you MAY be asked to HOLD this medication 48 hours AFTER the exam.   The purpose of you drinking the oral contrast is to aid in the visualization of your intestinal tract. The contrast solution may cause some diarrhea. Depending on your individual set of symptoms, you may also receive an intravenous injection of x-ray contrast/dye. Plan on being at [facility] for 45 minutes or longer, depending on the type of exam you are having performed.   If you have any questions regarding your exam or if you need to reschedule, you may call [facility] at [phone] between the hours of 8:00 am and 5:00 pm, Monday-Friday.

## 2023-08-03 ENCOUNTER — Ambulatory Visit (HOSPITAL_COMMUNITY)
Admission: RE | Admit: 2023-08-03 | Discharge: 2023-08-03 | Disposition: A | Discharge status: Home / Self Care | Payer: BC Managed Care – PPO | Source: Ambulatory Visit | Attending: Gastroenterology | Admitting: Gastroenterology

## 2023-08-03 DIAGNOSIS — R1032 Left lower quadrant pain: Secondary | ICD-10-CM | POA: Diagnosis present

## 2023-08-03 MED ORDER — IOHEXOL 350 MG/ML SOLN
75.0000 mL | Freq: Once | INTRAVENOUS | Status: AC | PRN
  Administered 2023-08-03: 75 mL via INTRAVENOUS

## 2023-08-13 ENCOUNTER — Other Ambulatory Visit: Payer: Self-pay | Admitting: *Deleted

## 2023-08-13 ENCOUNTER — Encounter: Payer: Self-pay | Admitting: *Deleted

## 2023-08-13 MED ORDER — PANTOPRAZOLE SODIUM 40 MG PO TBEC: 40.0000 mg | DELAYED_RELEASE_TABLET | Freq: Every day | ORAL | 1 refills | Status: DC

## 2023-10-11 ENCOUNTER — Other Ambulatory Visit: Payer: Self-pay | Admitting: Gastroenterology

## 2023-10-13 ENCOUNTER — Other Ambulatory Visit: Payer: Self-pay | Admitting: Nurse Practitioner

## 2023-10-13 DIAGNOSIS — Z0000 Encounter for general adult medical examination without abnormal findings: Secondary | ICD-10-CM

## 2023-10-14 ENCOUNTER — Ambulatory Visit
Admission: RE | Admit: 2023-10-14 | Discharge: 2023-10-14 | Disposition: A | Discharge status: Home / Self Care | Source: Ambulatory Visit | Attending: Nurse Practitioner | Admitting: Nurse Practitioner

## 2023-10-14 DIAGNOSIS — Z0000 Encounter for general adult medical examination without abnormal findings: Secondary | ICD-10-CM

## 2023-12-16 ENCOUNTER — Ambulatory Visit: Admitting: Emergency Medicine

## 2023-12-16 ENCOUNTER — Encounter: Payer: Self-pay | Admitting: Emergency Medicine

## 2023-12-16 VITALS — BP 110/62 | HR 54 | Temp 98.5°F | Ht 59.0 in | Wt 124.0 lb

## 2023-12-16 DIAGNOSIS — R7303 Prediabetes: Secondary | ICD-10-CM | POA: Diagnosis not present

## 2023-12-16 DIAGNOSIS — K2100 Gastro-esophageal reflux disease with esophagitis, without bleeding: Secondary | ICD-10-CM

## 2023-12-16 DIAGNOSIS — K760 Fatty (change of) liver, not elsewhere classified: Secondary | ICD-10-CM

## 2023-12-16 DIAGNOSIS — Z0001 Encounter for general adult medical examination with abnormal findings: Secondary | ICD-10-CM

## 2023-12-16 DIAGNOSIS — Z130 Encounter for screening for diseases of the blood and blood-forming organs and certain disorders involving the immune mechanism: Secondary | ICD-10-CM

## 2023-12-16 DIAGNOSIS — Z0000 Encounter for general adult medical examination without abnormal findings: Secondary | ICD-10-CM | POA: Diagnosis not present

## 2023-12-16 DIAGNOSIS — Z13220 Encounter for screening for lipoid disorders: Secondary | ICD-10-CM

## 2023-12-16 NOTE — Patient Instructions (Signed)
 Enfermedad del hgado graso (esteatosis heptica): Qu debe saber Fatty Liver Disease (Steatotic Liver Disease): What to Know  El hgado es un rgano que se encarga de muchas tareas. Produce protenas y [place] a Chartered certified accountant. Tambin elimina las cosas nocivas de la sangre y absorbe las vitaminas de los alimentos. La enfermedad del hgado graso sucede cuando se acumula demasiada grasa en las clulas del hgado. Tambin se la denomina esteatosis heptica. En muchos casos, la enfermedad del hgado graso no provoca sntomas. Pero con el tiempo, puede causar irritacin e hinchazn. Esto puede derivar en otros problemas hepticos como, por ejemplo: La cirrosis, o formacin de cicatrices en el hgado. Cncer de hgado. Insuficiencia heptica. Cules son las causas? Las causas de la enfermedad del hgado graso pueden ser las siguientes: Tener sobrepeso. Tener: Engineer, production. Hipertensin arterial. Sndrome de [place]. No obtener suficientes nutrientes en la dieta. Otras causas son: Determinados medicamentos. Txicos. Algunas infecciones provocadas por un germen llamado virus. Qu incrementa el riesgo? Es ms probable que tenga la enfermedad del hgado graso si: Bebe alcohol. Tiene sobrepeso. Tiene diabetes. Tiene hepatitis. Tiene un nivel alto de triglicridos. Est embarazada. Cules son los signos o sntomas? Es posible que no presente sntomas. Si los tiene, pueden [employer] siguientes: Sensacin de debilidad y [place]. Bajar de [place]. Ganas de vomitar. Vmitos. Ictericia. Sucede cuando la piel y la parte blanca de los ojos se ponen amarillos. Hinchazn en el vientre o las piernas. Dolor a Merchandiser, retail parte superior derecha del vientre. Cmo se diagnostica? La enfermedad del hgado graso se puede diagnosticar en funcin de sus antecedentes mdicos y un examen. Tambin podra necesitar pruebas. Pueden incluir: Anlisis de sangre. Una  ecografa. Una exploracin por tomografa computarizada (TC). Resonancia magntica (RM). Biopsia. Consiste en extraer Carlynn Herald de tejido del hgado para analizarla. Cmo se trata? Con frecuencia, la enfermedad del hgado graso es causada por otras afecciones. Tal vez deba tomar medicamentos y Radio producer cambios en su estilo de vida. Estos cambios pueden ayudarlo a Warehouse manager las siguientes: Trastorno por consumo de alcohol. Se trata de una afeccin en la que puede resultar difcil dejar de beber. Colesterol alto. Diabetes. Tener sobrepeso. Siga estas indicaciones en su casa: Consuma una dieta saludable. Trabaje con su mdico o con un experto en alimentacin saludable llamado nutricionista. Ellos pueden ayudarlo a Runner, broadcasting/film/video de alimentacin. Ejerctese lo suficiente. Esto puede ayudarlo a Publishing copy de [place]. Tambin puede ayudarlo a Public house manager y la diabetes. Hable con su mdico sobre un plan de ejercicio. Pregunte cules son las mejores actividades para que haga. No beba alcohol. Si tiene problemas para dejar de fumar, pdale ayuda al mdico. Use sus medicamentos nicamente segn las indicaciones. Concurra a todas las visitas de seguimiento. El mdico controlar si su situacin mejora. Comunquese con un mdico si: No puede controlar su nivel de azcar en la sangre. Esto es sumamente importante si tiene diabetes. Tiene fiebre. Tiene el vientre o las piernas hinchados. Siente dolor en el abdomen. Tiene ictericia. Tiene ganas de vomitar. Vomita. Solicite ayuda de inmediato si: Vomita, y el vmito tiene el siguiente aspecto: Sangre de color rojo brillante. Borra de caf. Vomita una sustancia que se parece a la borra de caf. Su materia fecal se ve sanguinolenta o negra. Se siente confundido. Estos sntomas pueden Customer service manager. Llame al 911 de inmediato. No espere a ver si los sntomas desaparecen. No conduzca por sus propios medios [employer]. [place]ta [place]re  informacin no tiene Theme park manager el consejo del mdico. Asegrese de hacerle al mdico cualquier pregunta que tenga. Document Revised: 01/12/2023 Document Reviewed: 01/12/2023 Elsevier Patient Education  2024 [employer].

## 2023-12-16 NOTE — Assessment & Plan Note (Signed)
Diet and nutrition discussed Hemoglobin A1c done today Cardiovascular risks associated with diabetes discussed

## 2023-12-16 NOTE — Assessment & Plan Note (Signed)
 CT scan report from 08/03/2023 reviewed with patient Diet and nutrition discussed

## 2023-12-16 NOTE — Assessment & Plan Note (Signed)
 Not responding to pantoprazole  40 mg daily Symptoms still active and affecting quality of life. Needs to follow-up with GI doctor Will need upper endoscopy Diet and nutrition discussed

## 2023-12-16 NOTE — Progress Notes (Signed)
 Samantha Robertson 53 y.o.   Chief Complaint  Patient presents with   Annual Exam    Patient here for physical. No other concerns    HISTORY OF PRESENT ILLNESS: This is a 53 y.o. female here for annual exam. Since her last visit she was able to follow-up with GI doctor CT scan done on 08/03/2023 showed fatty liver and esophageal inflammation Was started on pantoprazole  40 mg daily but symptoms persist.  Still complaining of burning upper abdominal pain No other complaints or medical concerns today.   HPI   Prior to Admission medications   Medication Sig Start Date End Date Taking? Authorizing Provider  pantoprazole  (PROTONIX ) 40 MG tablet TAKE 1 TABLET BY MOUTH EVERY DAY 10/12/23  Yes Zehr, Harlene BIRCH, PA-C    No Known Allergies  Patient Active Problem List   Diagnosis Date Noted   Kidney stone on left side 12/11/2019   Osteoarthritis of left foot 01/21/2017   Varicose veins of bilateral lower extremities with other complications 03/02/2013   Vitamin D  insufficiency 01/22/2012   ONYCHOMYCOSIS, TOENAILS 12/06/2009   DERMATOPHYTOSIS OF FOOT 12/06/2009   Prediabetes 07/11/2007    Past Medical History:  Diagnosis Date   Depression    Duodenal ulcer    Prediabetes    Vitamin D  deficiency     Past Surgical History:  Procedure Laterality Date   CESAREAN SECTION  06/29/06    REPEAT C/S W BTSP   TUBAL LIGATION  06-29-06   AT TIME OF REPEAT C/S    Social History   Socioeconomic History   Marital status: Married    Spouse name: Not on file   Number of children: 3   Years of education: Not on file   Highest education level: Not on file  Occupational History   Occupation: Psychiatric nurse  Tobacco Use   Smoking status: Never   Smokeless tobacco: Never  Vaping Use   Vaping status: Never Used  Substance and Sexual Activity   Alcohol use: No    Alcohol/week: 0.0 standard drinks of alcohol   Drug use: No   Sexual activity: Yes    Partners: Male    Birth  control/protection: Surgical, Post-menopausal    Comment: 1st intercourse- 19, partners- 1, btl  Other Topics Concern   Not on file  Social History Narrative   Not on file   Social Drivers of Health   Financial Resource Strain: Patient Declined (12/16/2023)   Overall Financial Resource Strain (CARDIA)    Difficulty of Paying Living Expenses: Patient declined  Food Insecurity: Patient Declined (12/16/2023)   Hunger Vital Sign    Worried About Running Out of Food in the Last Year: Patient declined    Ran Out of Food in the Last Year: Patient declined  Transportation Needs: Patient Declined (12/16/2023)   PRAPARE - Administrator, Civil Service (Medical): Patient declined    Lack of Transportation (Non-Medical): Patient declined  Physical Activity: Insufficiently Active (12/16/2023)   Exercise Vital Sign    Days of Exercise per Week: 7 days    Minutes of Exercise per Session: 20 min  Stress: No Stress Concern Present (12/16/2023)   [employer] of Occupational Health - Occupational Stress Questionnaire    Feeling of Stress: Only a little  Social Connections: Moderately Isolated (12/16/2023)   Social Connection and Isolation Panel    Frequency of Communication with Friends and Family: More than three times a week    Frequency of Social Gatherings with Friends and Family:  Once a week    Attends Religious Services: Patient declined    Active Member of Clubs or Organizations: No    Attends Engineer, structural: Not on file    Marital Status: Married  Catering manager Violence: Not on file    Family History  Problem Relation Age of Onset   Other Maternal Aunt        Cancer in blood   Kidney disease Father    Kidney disease Sister    Colon cancer Neg Hx    Colon polyps Neg Hx    Diabetes Neg Hx    Esophageal cancer Neg Hx    Gallbladder disease Neg Hx    Heart disease Neg Hx    Breast cancer Neg Hx      Review of Systems  Constitutional: Negative.  Negative  for chills and fever.  HENT: Negative.  Negative for congestion and sore throat.   Respiratory: Negative.  Negative for cough and shortness of breath.   Cardiovascular: Negative.  Negative for chest pain and palpitations.  Gastrointestinal:  Positive for heartburn. Negative for abdominal pain, nausea and vomiting.  Genitourinary: Negative.  Negative for dysuria and hematuria.  Skin: Negative.  Negative for rash.  Neurological: Negative.  Negative for dizziness and headaches.  All other systems reviewed and are negative.   Vitals:   12/16/23 0814  BP: 110/62  Pulse: (!) 54  Temp: 98.5 F (36.9 C)  SpO2: 97%    Physical Exam Vitals reviewed.  Constitutional:      Appearance: Normal appearance.  HENT:     Head: Normocephalic.     Right Ear: Tympanic membrane, ear canal and external ear normal.     Left Ear: Tympanic membrane, ear canal and external ear normal.     Mouth/Throat:     Mouth: Mucous membranes are moist.     Pharynx: Oropharynx is clear.  Eyes:     Extraocular Movements: Extraocular movements intact.     Conjunctiva/sclera: Conjunctivae normal.     Pupils: Pupils are equal, round, and reactive to light.  Cardiovascular:     Rate and Rhythm: Normal rate and regular rhythm.     Pulses: Normal pulses.     Heart sounds: Normal heart sounds.  Pulmonary:     Effort: Pulmonary effort is normal.     Breath sounds: Normal breath sounds.  Abdominal:     Palpations: Abdomen is soft.     Tenderness: There is abdominal tenderness (Mild epigastric tenderness).  Musculoskeletal:     Cervical back: No tenderness.  Lymphadenopathy:     Cervical: No cervical adenopathy.  Skin:    General: Skin is warm and dry.     Capillary Refill: Capillary refill takes less than 2 seconds.  Neurological:     General: No focal deficit present.     Mental Status: She is alert and oriented to person, place, and time.  Psychiatric:        Mood and Affect: Mood normal.        Behavior:  Behavior normal.      ASSESSMENT & PLAN: Problem List Items Addressed This Visit       Digestive   Hepatic steatosis   CT scan report from 08/03/2023 reviewed with patient Diet and nutrition discussed      Relevant Orders   Comprehensive metabolic panel with GFR   Gastroesophageal reflux disease with esophagitis without hemorrhage   Not responding to pantoprazole  40 mg daily Symptoms still active and affecting  quality of life. Needs to follow-up with GI doctor Will need upper endoscopy Diet and nutrition discussed        Other   Prediabetes   Diet and nutrition discussed Hemoglobin A1c done today Cardiovascular risks associated with diabetes discussed      Relevant Orders   Hemoglobin A1c   Other Visit Diagnoses       Encounter for general adult medical examination with abnormal findings    -  Primary   Relevant Orders   CBC with Differential/Platelet   Comprehensive metabolic panel with GFR   Hemoglobin A1c   Lipid panel   Vitamin B12   VITAMIN D  25 Hydroxy (Vit-D Deficiency, Fractures)   TSH     Screening for deficiency anemia       Relevant Orders   CBC with Differential/Platelet     Screening for lipoid disorders       Relevant Orders   Lipid panel     Screening for endocrine, metabolic and immunity disorder       Relevant Orders   Comprehensive metabolic panel with GFR   Vitamin B12   VITAMIN D  25 Hydroxy (Vit-D Deficiency, Fractures)   TSH      Modifiable risk factors discussed with patient. Anticipatory guidance according to age provided. The following topics were also discussed: Social Determinants of Health Smoking.  Non-smoker Diet and nutrition Benefits of exercise Cancer screening and need for colon cancer screening with colonoscopy.  She will be following up with GI doctor for upper endoscopy and will discuss colon cancer screening with him then. Vaccinations review and recommendations Cardiovascular risk assessment and need for blood  work The 10-year ASCVD risk score (Arnett DK, et al., 2019) is: 1.5%   Values used to calculate the score:     Age: 76 years     Clincally relevant sex: Female     Is Non-Hispanic African American: No     Diabetic: No     Tobacco smoker: No     Systolic Blood Pressure: 110 mmHg     Is BP treated: No     HDL Cholesterol: 39.1 mg/dL     Total Cholesterol: 177 mg/dL Review of chronic medical conditions under management Mental health including depression and anxiety Fall and accident prevention  Patient Instructions  Enfermedad del hgado graso (esteatosis heptica): Qu debe saber Fatty Liver Disease (Steatotic Liver Disease): What to Know  El hgado es un rgano que se encarga de muchas tareas. Produce protenas y [place] a Chartered certified accountant. Tambin elimina las cosas nocivas de la sangre y absorbe las vitaminas de los alimentos. La enfermedad del hgado graso sucede cuando se acumula demasiada grasa en las clulas del hgado. Tambin se la denomina esteatosis heptica. En muchos casos, la enfermedad del hgado graso no provoca sntomas. Pero con el tiempo, puede causar irritacin e hinchazn. Esto puede derivar en otros problemas hepticos como, por ejemplo: La cirrosis, o formacin de cicatrices en el hgado. Cncer de hgado. Insuficiencia heptica. Cules son las causas? Las causas de la enfermedad del hgado graso pueden ser las siguientes: Tener sobrepeso. Tener: Engineer, production. Hipertensin arterial. Sndrome de [place]. No obtener suficientes nutrientes en la dieta. Otras causas son: Determinados medicamentos. Txicos. Algunas infecciones provocadas por un germen llamado virus. Qu incrementa el riesgo? Es ms probable que tenga la enfermedad del hgado graso si: Bebe alcohol. Tiene sobrepeso. Tiene diabetes. Tiene hepatitis. Tiene un nivel alto de triglicridos. Est embarazada. Cules son los signos o sntomas?  Es posible que no presente  sntomas. Si los tiene, pueden [employer] siguientes: Sensacin de debilidad y [place]. Bajar de [place]. Ganas de vomitar. Vmitos. Ictericia. Sucede cuando la piel y la parte blanca de los ojos se ponen amarillos. Hinchazn en el vientre o las piernas. Dolor a Merchandiser, retail parte superior derecha del vientre. Cmo se diagnostica? La enfermedad del hgado graso se puede diagnosticar en funcin de sus antecedentes mdicos y un examen. Tambin podra necesitar pruebas. Pueden incluir: Anlisis de sangre. Una ecografa. Una exploracin por tomografa computarizada (TC). Resonancia magntica (RM). Biopsia. Consiste en extraer ignacia lemar jubilee de tejido del hgado para analizarla. Cmo se trata? Con frecuencia, la enfermedad del hgado graso es causada por otras afecciones. Tal vez deba tomar medicamentos y hacer cambios en su estilo de vida. Estos cambios pueden ayudarlo a Warehouse manager las siguientes: Trastorno por consumo de alcohol. Se trata de una afeccin en la que puede resultar difcil dejar de beber. Colesterol alto. Diabetes. Tener sobrepeso. Siga estas indicaciones en su casa: Consuma una dieta saludable. Trabaje con su mdico o con un experto en alimentacin saludable llamado nutricionista. Ellos pueden ayudarlo a Runner, broadcasting/film/video de alimentacin. Ejerctese lo suficiente. Esto puede ayudarlo a Publishing copy de [place]. Tambin puede ayudarlo a controlar el colesterol y la diabetes. Hable con su mdico sobre un plan de ejercicio. Pregunte cules son las mejores actividades para que haga. No beba alcohol. Si tiene problemas para dejar de fumar, pdale ayuda al mdico. Use sus medicamentos nicamente segn las indicaciones. Concurra a todas las visitas de seguimiento. El mdico controlar si su situacin mejora. Comunquese con un mdico si: No puede controlar su nivel de azcar en la sangre. Esto es sumamente importante si tiene diabetes. Tiene fiebre. Tiene el  vientre o las piernas hinchados. Siente dolor en el abdomen. Tiene ictericia. Tiene ganas de vomitar. Vomita. Solicite ayuda de inmediato si: Vomita, y el vmito tiene el siguiente aspecto: Sangre de color rojo brillante. Borra de caf. Vomita una sustancia que se parece a la borra de caf. Su materia fecal se ve sanguinolenta o negra. Se siente confundido. Estos sntomas pueden Customer service manager. Llame al 911 de inmediato. No espere a ver si los sntomas desaparecen. No conduzca por sus propios medios [employer]. Esta informacin no tiene Theme park manager el consejo del mdico. Asegrese de hacerle al mdico cualquier pregunta que tenga. Document Revised: 01/12/2023 Document Reviewed: 01/12/2023 Elsevier Patient Education  2024 Elsevier Inc.    Emil Schaumann, MD [facility]

## 2023-12-20 ENCOUNTER — Other Ambulatory Visit (INDEPENDENT_AMBULATORY_CARE_PROVIDER_SITE_OTHER)

## 2023-12-20 ENCOUNTER — Ambulatory Visit: Payer: Self-pay | Admitting: Emergency Medicine

## 2023-12-20 DIAGNOSIS — Z13228 Encounter for screening for other metabolic disorders: Secondary | ICD-10-CM

## 2023-12-20 DIAGNOSIS — E559 Vitamin D deficiency, unspecified: Secondary | ICD-10-CM

## 2023-12-20 DIAGNOSIS — Z130 Encounter for screening for diseases of the blood and blood-forming organs and certain disorders involving the immune mechanism: Secondary | ICD-10-CM | POA: Diagnosis not present

## 2023-12-20 DIAGNOSIS — Z0000 Encounter for general adult medical examination without abnormal findings: Secondary | ICD-10-CM | POA: Diagnosis not present

## 2023-12-20 DIAGNOSIS — Z13220 Encounter for screening for lipoid disorders: Secondary | ICD-10-CM | POA: Diagnosis not present

## 2023-12-20 DIAGNOSIS — Z0001 Encounter for general adult medical examination with abnormal findings: Secondary | ICD-10-CM

## 2023-12-20 DIAGNOSIS — R7303 Prediabetes: Secondary | ICD-10-CM | POA: Diagnosis not present

## 2023-12-20 DIAGNOSIS — Z1329 Encounter for screening for other suspected endocrine disorder: Secondary | ICD-10-CM

## 2023-12-20 DIAGNOSIS — K760 Fatty (change of) liver, not elsewhere classified: Secondary | ICD-10-CM

## 2023-12-20 LAB — LIPID PANEL
Cholesterol: 183 mg/dL (ref 0–200)
HDL: 40.9 mg/dL (ref >39.00)
LDL Cholesterol: 93 mg/dL (ref 0–99)
NonHDL: 142.15
Total CHOL/HDL Ratio: 4
Triglycerides: 244 mg/dL — ABNORMAL HIGH (ref 0.0–149.0)
VLDL: 48.8 mg/dL — ABNORMAL HIGH (ref 0.0–40.0)

## 2023-12-20 LAB — CBC WITH DIFFERENTIAL/PLATELET
Basophils Absolute: 0 K/uL (ref 0.0–0.1)
Basophils Relative: 0.9 % (ref 0.0–3.0)
Eosinophils Absolute: 0.1 K/uL (ref 0.0–0.7)
Eosinophils Relative: 2.6 % (ref 0.0–5.0)
HCT: 37.6 % (ref 36.0–46.0)
Hemoglobin: 12.9 g/dL (ref 12.0–15.0)
Lymphocytes Relative: 48.8 % — ABNORMAL HIGH (ref 12.0–46.0)
Lymphs Abs: 2.4 K/uL (ref 0.7–4.0)
MCHC: 34.2 g/dL (ref 30.0–36.0)
MCV: 93.2 fl (ref 78.0–100.0)
Monocytes Absolute: 0.4 K/uL (ref 0.1–1.0)
Monocytes Relative: 7.4 % (ref 3.0–12.0)
Neutro Abs: 1.9 K/uL (ref 1.4–7.7)
Neutrophils Relative %: 40.3 % — ABNORMAL LOW (ref 43.0–77.0)
Platelets: 235 K/uL (ref 150.0–400.0)
RBC: 4.04 Mil/uL (ref 3.87–5.11)
RDW: 13.5 % (ref 11.5–15.5)
WBC: 4.8 K/uL (ref 4.0–10.5)

## 2023-12-20 LAB — VITAMIN D 25 HYDROXY (VIT D DEFICIENCY, FRACTURES): VITD: 11.57 ng/mL — ABNORMAL LOW (ref 30.00–100.00)

## 2023-12-20 LAB — HEMOGLOBIN A1C: Hgb A1c MFr Bld: 6.3 % (ref 4.6–6.5)

## 2023-12-20 LAB — COMPREHENSIVE METABOLIC PANEL WITH GFR
ALT: 15 U/L (ref 0–35)
AST: 17 U/L (ref 0–37)
Albumin: 4.4 g/dL (ref 3.5–5.2)
Alkaline Phosphatase: 55 U/L (ref 39–117)
BUN: 16 mg/dL (ref 6–23)
CO2: 29 meq/L (ref 19–32)
Calcium: 9.5 mg/dL (ref 8.4–10.5)
Chloride: 104 meq/L (ref 96–112)
Creatinine, Ser: 0.85 mg/dL (ref 0.40–1.20)
GFR: 78.23 mL/min (ref >60.00)
Glucose, Bld: 93 mg/dL (ref 70–99)
Potassium: 4.2 meq/L (ref 3.5–5.1)
Sodium: 138 meq/L (ref 135–145)
Total Bilirubin: 0.5 mg/dL (ref 0.2–1.2)
Total Protein: 6.8 g/dL (ref 6.0–8.3)

## 2023-12-20 LAB — TSH: TSH: 3.04 u[IU]/mL (ref 0.35–5.50)

## 2023-12-20 LAB — VITAMIN B12: Vitamin B-12: 484 pg/mL (ref 211–911)

## 2023-12-20 MED ORDER — VITAMIN D (ERGOCALCIFEROL) 1.25 MG (50000 UNIT) PO CAPS: 50000.0000 [IU] | ORAL_CAPSULE | ORAL | 1 refills | Status: DC

## 2023-12-21 ENCOUNTER — Telehealth: Payer: Self-pay

## 2023-12-21 NOTE — Telephone Encounter (Signed)
 Copied from CRM [id number]. Topic: General - Other >> Dec 20, 2023  3:50 PM Zenovia PARAS wrote: Reason for CRM: Daughter Nena calling in for patient. Patient do not know what she's needing to schedule appt with gastro. Would like a call to discuss. Contact number is [phone]

## 2024-02-15 NOTE — Progress Notes (Unsigned)
 02/15/2024 Samantha Robertson [id number] 04-27-1971   Chief Complaint: Abdominal pain   History of Present Illness: Samantha Robertson is a 53 year old female with a past medical history of depression, prediabetes and a duodenal ulcer. She is known by Dr. Leigh. She was last see in office by Harlene Mail PA-C 07/28/2023 due to having epigastric and left sided abdominal pain x 5 months.   CT scan done on 08/03/2023 showed fatty liver and esophageal inflammation Was started on pantoprazole  40 mg daily but symptoms persist.  Still complaining of burning upper abdominal pain  She had H pylori  Breath test negative 01/26/19    EGD 10/25/2014 - superficial duodenal ulcer, otherwise normal exam - focal increase in intraepithelial lymphocytes.   EGD 04/26/2008 - normal exam   GES 08/03/2014 - normal    US  abdomen 01/20/2012 - normal exam   Cologuard 01/2021:  Negative.     Latest Ref Rng & Units 12/20/2023    1:40 PM 04/27/2023    3:36 PM 12/15/2022    8:31 AM  CBC  WBC 4.0 - 10.5 K/uL 4.8  4.9  4.0   Hemoglobin 12.0 - 15.0 g/dL 87.0  86.4  86.1   Hematocrit 36.0 - 46.0 % 37.6  39.8  41.6   Platelets 150.0 - 400.0 K/uL 235.0  268.0  227.0        Latest Ref Rng & Units 12/20/2023    1:40 PM 07/28/2023    3:45 PM 04/27/2023    3:36 PM  CMP  Glucose 70 - 99 mg/dL 93  894  880   BUN 6 - 23 mg/dL 16  13  13    Creatinine 0.40 - 1.20 mg/dL 9.14  9.26  9.24   Sodium 135 - 145 mEq/L 138  142  139   Potassium 3.5 - 5.1 mEq/L 4.2  4.1  4.0   Chloride 96 - 112 mEq/L 104  103  105   CO2 19 - 32 mEq/L 29  30  28    Calcium 8.4 - 10.5 mg/dL 9.5  9.4  9.1   Total Protein 6.0 - 8.3 g/dL 6.8   7.0   Total Bilirubin 0.2 - 1.2 mg/dL 0.5   0.5   Alkaline Phos 39 - 117 U/L 55   58   AST 0 - 37 U/L 17   27   ALT 0 - 35 U/L 15   40     CTAP with contrast 08/03/2023: FINDINGS: Lower chest: Mild symmetric distal esophageal wall thickening.   Hepatobiliary: Diffuse hepatic steatosis. Gallbladder  is unremarkable. No biliary ductal dilation.   Pancreas: No pancreatic ductal dilation or evidence of acute inflammation.   Spleen: No splenomegaly.   Adrenals/Urinary Tract: Bilateral adrenal glands appear normal. No hydronephrosis. Kidneys demonstrate symmetric enhancement. Urinary bladder is unremarkable for degree of distension.   Stomach/Bowel: No radiopaque enteric contrast material was administered. Stomach is unremarkable for degree of distension. Normal appendix. Colonic diverticulosis without findings of acute diverticulitis.   Vascular/Lymphatic: Normal caliber abdominal aorta. Smooth IVC contours. The portal, splenic and superior mesenteric veins are patent. Dilated gonadal veins and pelvic collateral vessels. No pathologically enlarged abdominal or pelvic lymph nodes.   Reproductive: Uterus and bilateral adnexa are unremarkable.   Other: Tiny fat containing umbilical hernia.   Musculoskeletal: No acute or significant osseous findings.   IMPRESSION: 1. No acute abnormality in the abdomen or pelvis. 2. Diffuse hepatic steatosis. 3. Colonic diverticulosis without findings of acute diverticulitis. 4. Dilated  gonadal veins and pelvic collateral vessels, which can be seen in the setting of pelvic congestion syndrome. 5. Mild symmetric distal esophageal wall thickening, which can be seen in the setting of esophagitis.      Past Medical History:  Diagnosis Date   Depression    Duodenal ulcer    Prediabetes    Vitamin D  deficiency    Past Surgical History:  Procedure Laterality Date   CESAREAN SECTION  06/29/06    REPEAT C/S W BTSP   TUBAL LIGATION  06-29-06   AT TIME OF REPEAT C/S       Current Medications, Allergies, Past Medical History, Past Surgical History, Family History and Social History were reviewed in [employer] record.   Review of Systems:   Constitutional: Negative for fever, sweats, chills or weight loss.   Respiratory: Negative for shortness of breath.   Cardiovascular: Negative for chest pain, palpitations and leg swelling.  Gastrointestinal: See HPI.  Musculoskeletal: Negative for back pain or muscle aches.  Neurological: Negative for dizziness, headaches or paresthesias.    Physical Exam: LMP  (LMP Unknown) Comment: BTL, sexually active General: in no acute distress. Head: Normocephalic and atraumatic. Eyes: No scleral icterus. Conjunctiva pink . Ears: Normal auditory acuity. Mouth: Dentition intact. No ulcers or lesions.  Lungs: Clear throughout to auscultation. Heart: Regular rate and rhythm, no murmur. Abdomen: Soft, nontender and nondistended. No masses or hepatomegaly. Normal bowel sounds x 4 quadrants.  Rectal: Deferred.  Musculoskeletal: Symmetrical with no gross deformities. Extremities: No edema. Neurological: Alert oriented x 4. No focal deficits.  Psychological: Alert and cooperative. Normal mood and affect  Assessment and Recommendations: ***

## 2024-02-17 ENCOUNTER — Encounter: Payer: Self-pay | Admitting: Nurse Practitioner

## 2024-02-17 ENCOUNTER — Ambulatory Visit: Admitting: Nurse Practitioner

## 2024-02-17 VITALS — BP 110/70 | HR 62 | Ht 61.0 in | Wt 123.0 lb

## 2024-02-17 DIAGNOSIS — R1013 Epigastric pain: Secondary | ICD-10-CM | POA: Diagnosis not present

## 2024-02-17 DIAGNOSIS — K625 Hemorrhage of anus and rectum: Secondary | ICD-10-CM

## 2024-02-17 DIAGNOSIS — K760 Fatty (change of) liver, not elsewhere classified: Secondary | ICD-10-CM

## 2024-02-17 MED ORDER — FAMOTIDINE 20 MG PO TABS: 20.0000 mg | ORAL_TABLET | Freq: Every day | ORAL | 1 refills | Status: AC

## 2024-02-17 MED ORDER — NA SULFATE-K SULFATE-MG SULF 17.5-3.13-1.6 GM/177ML PO SOLN: 1.0000 | Freq: Once | ORAL | 0 refills | Status: AC

## 2024-02-17 NOTE — Progress Notes (Signed)
 Agree with assessment / plan as outlined.

## 2024-02-17 NOTE — Patient Instructions (Addendum)
 We have sent the following medications to your pharmacy for you to pick up at your convenience: famotidine   Continue Pantoprazole  40 mg daily.   Avoid spicy and fatty foods  Apply a small amount of Desitin inside the anal opening and to the external anal area three times daily as needed for anal or hemorrhoidal irritation/bleeding.   You have been scheduled for an endoscopy and colonoscopy. Please follow the written instructions given to you at your visit today.  If you use inhalers (even only as needed), please bring them with you on the day of your procedure.  DO NOT TAKE 7 DAYS PRIOR TO TEST- Trulicity (dulaglutide) Ozempic, Wegovy (semaglutide) Mounjaro (tirzepatide) Bydureon Bcise (exanatide extended release)  DO NOT TAKE 1 DAY PRIOR TO YOUR TEST Rybelsus (semaglutide) Adlyxin (lixisenatide) Victoza (liraglutide) Byetta (exanatide) ___________________________________________________________________________

## 2024-03-27 ENCOUNTER — Ambulatory Visit (AMBULATORY_SURGERY_CENTER): Admitting: Gastroenterology

## 2024-03-27 ENCOUNTER — Encounter: Payer: Self-pay | Admitting: Gastroenterology

## 2024-03-27 VITALS — BP 95/50 | HR 50 | Temp 98.2°F | Resp 19 | Ht 61.0 in | Wt 123.0 lb

## 2024-03-27 DIAGNOSIS — K625 Hemorrhage of anus and rectum: Secondary | ICD-10-CM

## 2024-03-27 DIAGNOSIS — Z8719 Personal history of other diseases of the digestive system: Secondary | ICD-10-CM

## 2024-03-27 DIAGNOSIS — D127 Benign neoplasm of rectosigmoid junction: Secondary | ICD-10-CM

## 2024-03-27 DIAGNOSIS — Z1211 Encounter for screening for malignant neoplasm of colon: Secondary | ICD-10-CM

## 2024-03-27 DIAGNOSIS — K6289 Other specified diseases of anus and rectum: Secondary | ICD-10-CM | POA: Diagnosis not present

## 2024-03-27 DIAGNOSIS — R1013 Epigastric pain: Secondary | ICD-10-CM

## 2024-03-27 DIAGNOSIS — K648 Other hemorrhoids: Secondary | ICD-10-CM

## 2024-03-27 DIAGNOSIS — K2950 Unspecified chronic gastritis without bleeding: Secondary | ICD-10-CM

## 2024-03-27 MED ORDER — SODIUM CHLORIDE 0.9 % IV SOLN: 500.0000 mL | Freq: Once | INTRAVENOUS | Status: DC

## 2024-03-27 NOTE — Op Note (Signed)
 [facility] Patient Name: Samantha Robertson Procedure Date: 03/27/2024 2:45 PM MRN: [id number] Endoscopist: Elspeth P. Leigh , MD, [id number] Age: 53 Referring MD:  Date of Birth: 26-Jan-1971 Gender: Female Account #: [id number] Procedure:                Colonoscopy Indications:              Screening for colorectal malignant neoplasm, This                            is the patient's first colonoscopy, per office note                            patient reportedly had rectal bleeding - patient                            denies this, states she does not have rectal                            bleeding Medicines:                Monitored Anesthesia Care Procedure:                Pre-Anesthesia Assessment:                           - Prior to the procedure, a History and Physical                            was performed, and patient medications and                            allergies were reviewed. The patient's tolerance of                            previous anesthesia was also reviewed. The risks                            and benefits of the procedure and the sedation                            options and risks were discussed with the patient.                            All questions were answered, and informed consent                            was obtained. Prior Anticoagulants: The patient has                            taken no anticoagulant or antiplatelet agents. ASA                            Grade Assessment: II - A patient with mild systemic  disease. After reviewing the risks and benefits,                            the patient was deemed in satisfactory condition to                            undergo the procedure.                           After obtaining informed consent, the colonoscope                            was passed under direct vision. Throughout the                            procedure, the patient's blood pressure,  pulse, and                            oxygen saturations were monitored continuously. The                            Olympus Scope [id number] was introduced through the                            anus and advanced to the the cecum, identified by                            appendiceal orifice and ileocecal valve. The                            colonoscopy was performed without difficulty. The                            patient tolerated the procedure well. The quality                            of the bowel preparation was adequate. The                            ileocecal valve, appendiceal orifice, and rectum                            were photographed. Scope In: 3:18:51 PM Scope Out: 3:34:02 PM Scope Withdrawal Time: 0 hours 12 minutes 40 seconds  Total Procedure Duration: 0 hours 15 minutes 11 seconds  Findings:                 The perianal and digital rectal examinations were                            normal.                           A 3 to 4 mm polyp was found in the recto-sigmoid  colon. The polyp was sessile. The polyp was removed                            with a cold snare. Resection and retrieval were                            complete.                           Anal papilla(e) were hypertrophied.                           Internal hemorrhoids were found during                            retroflexion. The hemorrhoids were small.                           The exam was otherwise without abnormality. Complications:            No immediate complications. Estimated blood loss:                            Minimal. Estimated Blood Loss:     Estimated blood loss was minimal. Impression:               - One 3 to 4 mm polyp at the recto-sigmoid colon,                            removed with a cold snare. Resected and retrieved.                           - Anal papilla(e) were hypertrophied.                           - Internal hemorrhoids.                            - The examination was otherwise normal. Recommendation:           - Patient has a contact number available for                            emergencies. The signs and symptoms of potential                            delayed complications were discussed with the                            patient. Return to normal activities tomorrow.                            Written discharge instructions were provided to the                            patient.                           -  Resume previous diet.                           - Continue present medications.                           - Await pathology results. Elspeth P. Tracee Mccreery, MD 03/27/2024 3:38:38 PM This report has been signed electronically.

## 2024-03-27 NOTE — Progress Notes (Signed)
 Transferred to PACU via stretcher, arousing, VSS.

## 2024-03-27 NOTE — Patient Instructions (Addendum)
 YOU HAD AN ENDOSCOPIC PROCEDURE TODAY AT THE [facility]:   Refer to the procedure report that was given to you for any specific questions about what was found during the examination.  If the procedure report does not answer your questions, please call your gastroenterologist to clarify.  If you requested that your care partner not be given the details of your procedure findings, then the procedure report has been included in a sealed envelope for you to review at your convenience later.  YOU SHOULD EXPECT: Some feelings of bloating in the abdomen. Passage of more gas than usual.  Walking can help get rid of the air that was put into your GI tract during the procedure and reduce the bloating. If you had a lower endoscopy (such as a colonoscopy or flexible sigmoidoscopy) you may notice spotting of blood in your stool or on the toilet paper. If you underwent a bowel prep for your procedure, you may not have a normal bowel movement for a few days.  Please Note:  You might notice some irritation and congestion in your nose or some drainage.  This is from the oxygen used during your procedure.  There is no need for concern and it should clear up in a day or so.  SYMPTOMS TO REPORT IMMEDIATELY:  Following lower endoscopy (colonoscopy or flexible sigmoidoscopy):  Excessive amounts of blood in the stool  Significant tenderness or worsening of abdominal pains  Swelling of the abdomen that is new, acute  Fever of 100F or higher  Following upper endoscopy (EGD)  Vomiting of blood or coffee ground material  New chest pain or pain under the shoulder blades  Painful or persistently difficult swallowing  New shortness of breath  Fever of 100F or higher  Black, tarry-looking stools  Resume previous diet Continue present medications Await pathology results Consideration for RUQ ultrasound to screen for gallstones if not done recently Handouts on hemorrhoids and polyps given    For urgent  or emergent issues, a gastroenterologist can be reached at any hour by calling [phone]. Do not use MyChart messaging for urgent concerns.    DIET:  We do recommend a small meal at first, but then you may proceed to your regular diet.  Drink plenty of fluids but you should avoid alcoholic beverages for 24 hours.  ACTIVITY:  You should plan to take it easy for the rest of today and you should NOT DRIVE or use heavy machinery until tomorrow (because of the sedation medicines used during the test).    FOLLOW UP: Our staff will call the number listed on your records the next business day following your procedure.  We will call around 7:15- 8:00 am to check on you and address any questions or concerns that you may have regarding the information given to you following your procedure. If we do not reach you, we will leave a message.     If any biopsies were taken you will be contacted by phone or by letter within the next 1-3 weeks.  Please call us  at [phone] if you have not heard about the biopsies in 3 weeks.    SIGNATURES/CONFIDENTIALITY: You and/or your care partner have signed paperwork which will be entered into your electronic medical record.  These signatures attest to the fact that that the information above on your After Visit Summary has been reviewed and is understood.  Full responsibility of the confidentiality of this discharge information lies with you and/or your care-partner.  USTED TUVO UN PROCEDIMIENTO ENDOSCPICO HOY EN EL [facility]:   Lea el informe del procedimiento que se le entreg para cualquier pregunta especfica sobre lo que se Dentist.  Si el informe del examen no responde a sus preguntas, por favor llame a su gastroenterlogo para aclararlo.  Si usted solicit que no se le den [employer] de lo que se Clinical cytogeneticist en su procedimiento al [employer] va a cuidar, entonces el informe del procedimiento se ha incluido en un sobre  sellado para que usted lo revise despus cuando le sea ms conveniente.   LO QUE PUEDE ESPERAR: Algunas sensaciones de hinchazn en el abdomen.  Puede tener ms gases de lo normal.  El caminar puede ayudarle a eliminar el aire que se le puso en el tracto gastrointestinal durante el procedimiento y reducir la hinchazn.  Si le hicieron una endoscopia inferior (como una colonoscopia o una sigmoidoscopia flexible), podra notar manchas de sangre en las heces fecales o en el papel higinico.  Si se someti a una preparacin intestinal para su procedimiento, es posible que no tenga una evacuacin intestinal normal durante [employer].   Tenga en cuenta:  Es posible que note un poco de irritacin y congestin en la nariz o algn drenaje.  Esto es debido al oxgeno [employer] durante su procedimiento.  No hay que preocuparse y esto debe desaparecer ms o Regulatory affairs officer.   SNTOMAS PARA REPORTAR INMEDIATAMENTE:  Despus de una endoscopia inferior (colonoscopia o sigmoidoscopia flexible):  Cantidades excesivas de sangre en las heces fecales  Sensibilidad significativa o empeoramiento de los dolores abdominales   Hinchazn aguda del abdomen que antes no tena   Fiebre de 100F o ms   Despus de la endoscopia superior (EGD)  Vmitos de Retail buyer o material como caf molido   Dolor en el pecho o dolor debajo de los omplatos que antes no tena   Dolor o dificultad persistente para tragar  Falta de aire que antes no tena   Fiebre de 100F o ms  Heces fecales negras y pegajosas   Para asuntos urgentes o de Associate Professor, puede comunicarse con un gastroenterlogo a cualquier hora llamando al [phone].  DIETA:  Recomendamos una comida pequea al principio, pero luego puede continuar con su dieta normal.  Tome muchos lquidos, pero debe evitar las bebidas alcohlicas durante 24 horas.    ACTIVIDAD:  Debe planear tomarse las cosas con calma por el resto del da y no debe CONDUCIR ni usar maquinaria pesada  Patent examiner (debido a los medicamentos de sedacin utilizados durante el examen).     SEGUIMIENTO: Nuestro personal llamar al nmero que aparece en su historial al siguiente da hbil de su procedimiento para ver cmo se siente y para responder cualquier pregunta o inquietud que pueda tener con respecto a la informacin que se le dio despus del procedimiento. Si no podemos contactarle, le dejaremos un mensaje.  Sin embargo, si se siente bien y no tiene English as a second language teacher, no es necesario que nos devuelva la llamada.  Asumiremos que ha regresado a sus actividades diarias normales sin incidentes. Si se le tomaron algunas biopsias, le contactaremos por telfono o por carta en las prximas 3 semanas.  Si no ha sabido [employer] biopsias en el transcurso de 3 semanas, por favor llmenos al [phone].   FIRMAS/CONFIDENCIALIDAD: Usted y/o el acompaante que le cuide han firmado documentos que se ingresarn en su historial mdico Forensic scientist.  Estas firmas atestiguan el  hecho de que la informacin anterior

## 2024-03-27 NOTE — Progress Notes (Signed)
 Interpreter, [employer], present during addmission

## 2024-03-27 NOTE — Progress Notes (Signed)
 Spanish interpreter used throughout the recovery room phase and discharge instructions given in Spanish.  Care partner with no questions/concerns.

## 2024-03-27 NOTE — Progress Notes (Signed)
 [facility] History and Physical   Primary Care Physician:  Samantha Emil Schanz, MD   Reason for Procedure:   Epigastric pain / history of duodenal ulcer, colon cancer screening - first time colonoscopy, episode of rectal bleeding  Plan:    EGD and colonoscopy     HPI: Samantha Robertson is a 53 y.o. female  here for EGD and colonoscopy - has had epigastric pain, remote history of duodenal ulcer on prior EGD. On protonix  40mg  / day and pepcid  20mg  / day. EGD to evaluate. Otherwise, no prior colonoscopy, has itching, hemorrhoids, rare rectal bleeding. Per last note this was reported as once monthly. Patient reports only one episode of bleeding ever. Colonoscopy to further evaluate.  She has never had one   No family history of colon cancer known. Otherwise feels well without any cardiopulmonary symptoms.   I have discussed risks / benefits of anesthesia and endoscopic procedure with Samantha Robertson and they wish to proceed with the exams as outlined today.    Past Medical History:  Diagnosis Date   Depression    Duodenal ulcer    Fatty liver    GERD (gastroesophageal reflux disease)    Prediabetes    Vitamin Robertson  deficiency     Past Surgical History:  Procedure Laterality Date   CESAREAN SECTION  06/29/06    REPEAT C/S W BTSP   TUBAL LIGATION  06-29-06   AT TIME OF REPEAT C/S    Prior to Admission medications   Medication Sig Start Date End Date Taking? Authorizing Provider  pantoprazole  (PROTONIX ) 40 MG tablet TAKE 1 TABLET BY MOUTH EVERY DAY 10/12/23  Yes Zehr, Jessica D, PA-C  famotidine  (PEPCID ) 20 MG tablet Take 1 tablet (20 mg total) by mouth at bedtime. 02/17/24   Kennedy-Smith, Colleen M, NP    Current Outpatient Medications  Medication Sig Dispense Refill   pantoprazole  (PROTONIX ) 40 MG tablet TAKE 1 TABLET BY MOUTH EVERY DAY 90 tablet 1   famotidine  (PEPCID ) 20 MG tablet Take 1 tablet (20 mg total) by mouth at bedtime. 30 tablet 1   Current  Facility-Administered Medications  Medication Dose Route Frequency Provider Last Rate Last Admin   0.9 %  sodium chloride  infusion  500 mL Intravenous Once Samantha Robertson, Samantha SQUIBB, MD        Allergies as of 03/27/2024   (No Known Allergies)    Family History  Problem Relation Age of Onset   Other Mother        hx unknown   Kidney disease Father    Kidney disease Sister    Other Maternal Aunt        Cancer in blood   Colon cancer Neg Hx    Colon polyps Neg Hx    Diabetes Neg Hx    Esophageal cancer Neg Hx    Gallbladder disease Neg Hx    Heart disease Neg Hx    Breast cancer Neg Hx     Social History   Socioeconomic History   Marital status: Married    Spouse name: Not on file   Number of children: 3   Years of education: Not on file   Highest education level: Not on file  Occupational History   Occupation: Psychiatric nurse  Tobacco Use   Smoking status: Never   Smokeless tobacco: Never  Vaping Use   Vaping status: Never Used  Substance and Sexual Activity   Alcohol use: No    Alcohol/week: 0.0 standard drinks of alcohol  Drug use: No   Sexual activity: Yes    Partners: Male    Birth control/protection: Surgical, Post-menopausal    Comment: 1st intercourse- 19, partners- 1, btl  Other Topics Concern   Not on file  Social History Narrative   Not on file   Social Drivers of Health   Financial Resource Strain: Patient Declined (12/16/2023)   Overall Financial Resource Strain (CARDIA)    Difficulty of Paying Living Expenses: Patient declined  Food Insecurity: Patient Declined (12/16/2023)   Hunger Vital Sign    Worried About Running Out of Food in the Last Year: Patient declined    Ran Out of Food in the Last Year: Patient declined  Transportation Needs: Patient Declined (12/16/2023)   PRAPARE - Administrator, Civil Service (Medical): Patient declined    Lack of Transportation (Non-Medical): Patient declined  Physical Activity: Insufficiently Active  (12/16/2023)   Exercise Vital Sign    Days of Exercise per Week: 7 days    Minutes of Exercise per Session: 20 min  Stress: No Stress Concern Present (12/16/2023)   [employer] of Occupational Health - Occupational Stress Questionnaire    Feeling of Stress: Only a little  Social Connections: Moderately Isolated (12/16/2023)   Social Connection and Isolation Panel    Frequency of Communication with Friends and Family: More than three times a week    Frequency of Social Gatherings with Friends and Family: Once a week    Attends Religious Services: Patient declined    Database administrator or Organizations: No    Attends Engineer, structural: Not on file    Marital Status: Married  Catering manager Violence: Not on file    Review of Systems: All other review of systems negative except as mentioned in the HPI.  Physical Exam: Vital signs BP 115/61   Pulse (!) 54   Temp 98.2 F (36.8 C)   Ht 5' 1 (1.549 Robertson)   Wt 123 lb (55.8 kg)   LMP  (LMP Unknown) Comment: BTL, sexually active  SpO2 99%   BMI 23.24 kg/Robertson   General:   Alert,  Well-developed, pleasant and cooperative in NAD Lungs:  Clear throughout to auscultation.   Heart:  Regular rate and rhythm Abdomen:  Soft, nontender and nondistended.   Neuro/Psych:  Alert and cooperative. Normal mood and affect. A and O x 3  Samantha Naval, MD [facility]

## 2024-03-27 NOTE — Progress Notes (Signed)
 Called to room to assist during endoscopic procedure.  Patient ID and intended procedure confirmed with present staff. Received instructions for my participation in the procedure from the performing physician.

## 2024-03-27 NOTE — Op Note (Signed)
 [facility] Patient Name: Samantha Robertson Procedure Date: 03/27/2024 2:53 PM MRN: [id number] Endoscopist: Elspeth P. Leigh , MD, [id number] Age: 54 Referring MD:  Date of Birth: Feb 15, 1971 Gender: Female Account #: [id number] Procedure:                Upper GI endoscopy Indications:              Epigastric abdominal pain, history of duodenal                            ulcer - on protonix  and pepcid  without benefit Medicines:                Monitored Anesthesia Care Procedure:                Pre-Anesthesia Assessment:                           - Prior to the procedure, a History and Physical                            was performed, and patient medications and                            allergies were reviewed. The patient's tolerance of                            previous anesthesia was also reviewed. The risks                            and benefits of the procedure and the sedation                            options and risks were discussed with the patient.                            All questions were answered, and informed consent                            was obtained. Prior Anticoagulants: The patient has                            taken no anticoagulant or antiplatelet agents. ASA                            Grade Assessment: II - A patient with mild systemic                            disease. After reviewing the risks and benefits,                            the patient was deemed in satisfactory condition to                            undergo the procedure.  After obtaining informed consent, the endoscope was                            passed under direct vision. Throughout the                            procedure, the patient's blood pressure, pulse, and                            oxygen saturations were monitored continuously. The                            GIF [id number] #[id number] was introduced through the                             mouth, and advanced to the second part of duodenum.                            The upper GI endoscopy was accomplished without                            difficulty. The patient tolerated the procedure                            well. Scope In: Scope Out: Findings:                 Esophagogastric landmarks were identified: the                            Z-line was found at 35 cm, the gastroesophageal                            junction was found at 35 cm and the upper extent of                            the gastric folds was found at 35 cm from the                            incisors.                           The exam of the esophagus was otherwise normal.                           The entire examined stomach was normal. Biopsies                            were taken with a cold forceps for Helicobacter                            pylori testing.                           The examined duodenum was normal. Complications:  No immediate complications. Estimated blood loss:                            Minimal. Estimated Blood Loss:     Estimated blood loss was minimal. Impression:               - Esophagogastric landmarks identified.                           - Normal esophagus otherwise.                           - Normal stomach. Biopsied.                           - Normal examined duodenum.                           No cause for symptoms on EGD, will await pathology                            results. Recommendation:           - Patient has a contact number available for                            emergencies. The signs and symptoms of potential                            delayed complications were discussed with the                            patient. Return to normal activities tomorrow.                            Written discharge instructions were provided to the                            patient.                           - Resume previous diet.                            - Continue present medications.                           - Await pathology results.                           - Consideration for RUQ US  to screen for gallstones                            if not done recently Elspeth P. Tessie Ordaz, MD 03/27/2024 3:17:18 PM This report has been signed electronically.

## 2024-03-28 ENCOUNTER — Telehealth: Payer: Self-pay

## 2024-03-28 NOTE — Telephone Encounter (Signed)
  Follow up Call-     03/27/2024    2:13 PM  Call back number  Post procedure Call Back phone  # [phone]  Permission to leave phone message Yes     Patient questions:  Do you have a fever, pain , or abdominal swelling? No. Pain Score  0 *  Have you tolerated food without any problems? Yes.    Have you been able to return to your normal activities? Yes.    Do you have any questions about your discharge instructions: Diet   No. Medications  No. Follow up visit  No.  Do you have questions or concerns about your Care? No.  Actions: * If pain score is 4 or above: No action needed, pain <4.

## 2024-03-30 ENCOUNTER — Ambulatory Visit: Payer: Self-pay | Admitting: Gastroenterology

## 2024-03-30 DIAGNOSIS — R1013 Epigastric pain: Secondary | ICD-10-CM

## 2024-03-30 LAB — SURGICAL PATHOLOGY

## 2024-04-03 ENCOUNTER — Encounter (HOSPITAL_COMMUNITY): Payer: Self-pay

## 2024-04-07 ENCOUNTER — Ambulatory Visit (HOSPITAL_COMMUNITY)

## 2024-04-11 ENCOUNTER — Ambulatory Visit (HOSPITAL_COMMUNITY)
Admission: RE | Admit: 2024-04-11 | Discharge: 2024-04-11 | Disposition: A | Discharge status: Home / Self Care | Source: Ambulatory Visit | Attending: Gastroenterology | Admitting: Gastroenterology

## 2024-04-11 DIAGNOSIS — R1013 Epigastric pain: Secondary | ICD-10-CM | POA: Diagnosis present

## 2024-04-13 ENCOUNTER — Ambulatory Visit: Payer: Self-pay | Admitting: Gastroenterology

## 2024-04-13 NOTE — Telephone Encounter (Signed)
 Patient spouse requesting f/u call in regards to previous note. States wife was not able to retrieve any information this morning with the interpretor due to loud noise. Please advise.

## 2024-06-05 ENCOUNTER — Ambulatory Visit (INDEPENDENT_AMBULATORY_CARE_PROVIDER_SITE_OTHER): Admitting: Gastroenterology

## 2024-06-05 ENCOUNTER — Encounter: Payer: Self-pay | Admitting: Gastroenterology

## 2024-06-05 VITALS — BP 102/70 | HR 64 | Ht 59.75 in | Wt 122.1 lb

## 2024-06-05 DIAGNOSIS — K760 Fatty (change of) liver, not elsewhere classified: Secondary | ICD-10-CM

## 2024-06-05 DIAGNOSIS — R1013 Epigastric pain: Secondary | ICD-10-CM

## 2024-06-05 DIAGNOSIS — K219 Gastro-esophageal reflux disease without esophagitis: Secondary | ICD-10-CM | POA: Diagnosis not present

## 2024-06-05 DIAGNOSIS — Z79899 Other long term (current) drug therapy: Secondary | ICD-10-CM | POA: Diagnosis not present

## 2024-06-05 MED ORDER — PANTOPRAZOLE SODIUM 40 MG PO TBEC: DELAYED_RELEASE_TABLET | ORAL | 1 refills | Status: AC

## 2024-06-05 NOTE — Patient Instructions (Addendum)
 Continue Protonix  but take the lowest dose that manages your symptoms.  You can try 20 mg once daily (1/2 tablet). If that is not effective, you can resume 40 mg once daily.  Please follow up in 1 year.  Thank you for entrusting me with your care and for choosing [facility], Dr. Elspeth Naval    _______________________________________________________  If your blood pressure at your visit was 140/90 or greater, please contact your primary care physician to follow up on this.  _______________________________________________________  If you are age 55 or older, your body mass index should be between 23-30. Your Body mass index is 24.05 kg/m. If this is out of the aforementioned range listed, please consider follow up with your Primary Care Provider.  If you are age 20 or younger, your body mass index should be between 19-25. Your Body mass index is 24.05 kg/m. If this is out of the aformentioned range listed, please consider follow up with your Primary Care Provider.   ________________________________________________________  The [facility] providers would like to encourage you to use MYCHART to communicate with providers for non-urgent requests or questions.  Due to long hold times on the telephone, sending your provider a message by [facility] may be a faster and more efficient way to get a response.  Please allow 48 business hours for a response.  Please remember that this is for non-urgent requests.  _______________________________________________________  [facility] is using a team-based approach to care.  Your team is made up of your doctor and two to three APPS. Our APPS (Nurse Practitioners and Physician Assistants) work with your physician to ensure care continuity for you. They are fully qualified to address your health concerns and develop a treatment plan. They communicate directly with your gastroenterologist to care for you. Seeing the Advanced Practice  Practitioners on your physician's team can help you by facilitating care more promptly, often allowing for earlier appointments, access to diagnostic testing, procedures, and other specialty referrals.

## 2024-06-05 NOTE — Progress Notes (Signed)
 "  HPI :  53 year old female here for follow-up visit for GERD, dyspepsia, fatty liver.  Recall she was seen back in September for dyspepsia and some reflux, symptoms were worse after eating heavier creamy meals.  She was on Protonix  40 mg daily at the time.  Recall she had a history of a duodenal ulcer back in 2016.  She had an EGD with me on October 13.  No evidence of Barrett's esophagus.  She tested negative for H. pylori.  I had recommended a course of FDgard to treat for dyspepsia, unclear if she took that.  She was taking both Pepcid  and Protonix  at the same time.  She felt that Pepcid  was making her bloated so she eventually stopped it.  Sounds like she was on Protonix  for some time, then tried coming off of it or ran out and her symptoms came back.  She has since resumed it in recent weeks and her symptoms are now pretty well-controlled.  She denies any reflux that bothers her routinely and her dyspepsia appears pretty well-controlled.  She does have an occasional loose stool if she eats a very heavy meal or greasy meal with cream, this is not common for her.  She tries to avoid eating those types of foods.  Her bowels are otherwise stable.  She also had a colonoscopy at the same time as her upper endoscopy and had 1 small adenoma removed.  I had also screened her with an ultrasound at the end of October to ensure no evidence of gallstones, the time of her endoscopy she was more symptomatic.  She did not have any gallstones and biliary colic thought to be less likely.  She did have hepatic steatosis noted.  This was also noted on CT scan back in February.  No evidence of cirrhosis.  Her liver enzymes have historically been normal, she had that done last July.  Fib 4 is less than 1.  She denies use of any alcohol.  She states her weight has been stable over time, she weighs 122 pounds with a BMI of 24.  She is prediabetic with an A1c in the low sixes.  She states she has always been only eating.   She is not on any cholesterol medicine.  We discussed this for a bit.  Of note translator present to obtain history and communicate.  PRIOR GI WORK UP:   She had H pylori  Breath test negative 01/26/19    EGD 10/25/2014 - superficial duodenal ulcer, otherwise normal exam PATCHY MILD VILLOUS BLUNTING WITH FOCAL INCREASE IN INTRAEPITHELIAL LYMPHOCYTES. NO DYSPLASIA OR MALIGNANCY   EGD 04/26/2008 - normal exam   GES 08/03/2014 - normal    US  abdomen 01/20/2012 - normal exam   Cologuard 01/2021:  Negative.  CTAP with contrast 08/03/2023: FINDINGS: Lower chest: Mild symmetric distal esophageal wall thickening.   Hepatobiliary: Diffuse hepatic steatosis. Gallbladder is unremarkable. No biliary ductal dilation.    EGD 03/27/24: - Esophagogastric landmarks were identified: the Z-line was found at 35 cm, the gastroesophageal junction was found at 35 cm and the upper extent of the gastric folds was found at 35 cm from the incisors. Findings: - The exam of the esophagus was otherwise normal. - The entire examined stomach was normal. Biopsies were taken with a cold forceps for Helicobacter pylori testing. - The examined duodenum was normal.  Colonoscopy 03/27/24: - The perianal and digital rectal examinations were normal. - A 3 to 4 mm polyp was found in the recto-sigmoid  colon. The polyp was sessile. The polyp was removed with a cold snare. Resection and retrieval were complete. - Anal papilla(e) were hypertrophied. - Internal hemorrhoids were found during retroflexion. The hemorrhoids were small. - The exam was otherwise without abnormality.  FINAL DIAGNOSIS        1. Surgical [P], gastric antrum and gastric body :       - MILD CHRONIC GASTRITIS.       - NEGATIVE FOR H. PYLORI ON H&E STAIN       - NO INTESTINAL METAPLASIA, DYSPLASIA, OR MALIGNANCY.        2. Surgical [P], colon, rectosigmoid, polyp (1) :       - TUBULAR ADENOMA.       - NO HIGH GRADE DYSPLASIA OR MALIGNANCY.    RUQ US   04/11/24: IMPRESSION: Increased parenchymal echogenicity which can be seen the setting of hepatic steatosis/hepatocellular disease. Correlate with liver function tests.  Past Medical History:  Diagnosis Date   Depression    Duodenal ulcer    Fatty liver    GERD (gastroesophageal reflux disease)    Prediabetes    Vitamin D  deficiency      Past Surgical History:  Procedure Laterality Date   CESAREAN SECTION  06/29/06    REPEAT C/S W BTSP   TUBAL LIGATION  06-29-06   AT TIME OF REPEAT C/S   Family History  Problem Relation Age of Onset   Other Mother        hx unknown   Kidney disease Father    Kidney disease Sister    Other Maternal Aunt        Cancer in blood   Colon cancer Neg Hx    Colon polyps Neg Hx    Diabetes Neg Hx    Esophageal cancer Neg Hx    Gallbladder disease Neg Hx    Heart disease Neg Hx    Breast cancer Neg Hx    Social History[1] Current Outpatient Medications  Medication Sig Dispense Refill   pantoprazole  (PROTONIX ) 40 MG tablet TAKE 1 TABLET BY MOUTH EVERY DAY 90 tablet 1   famotidine  (PEPCID ) 20 MG tablet Take 1 tablet (20 mg total) by mouth at bedtime. (Patient not taking: Reported on 06/05/2024) 30 tablet 1   No current facility-administered medications for this visit.   Allergies[2]   Review of Systems: All systems reviewed and negative except where noted in HPI.   Lab Results  Component Value Date   ALT 15 12/20/2023   AST 17 12/20/2023   ALKPHOS 55 12/20/2023   BILITOT 0.5 12/20/2023    Lab Results  Component Value Date   WBC 4.8 12/20/2023   HGB 12.9 12/20/2023   HCT 37.6 12/20/2023   MCV 93.2 12/20/2023   PLT 235.0 12/20/2023     Fibrosis 4 Score = .99 (Low risk)        Interpretation for patients with NAFLD          <1.30       -  F0-F1 (Low risk)          1.30-2.67 -  Indeterminate           >2.67      -  F3-F4 (High risk)     Validated for ages 37-65         Physical Exam: BP 102/70   Pulse 64   Ht 4'  11.75 (1.518 m) Comment: without shoes  Wt 122 lb 2 oz (55.4 kg)  LMP  (LMP Unknown) Comment: BTL, sexually active  BMI 24.05 kg/m  Constitutional: Pleasant,well-developed, female in no acute distress. Neurological: Alert and oriented to person place and time. Skin: Skin is warm and dry. No rashes noted. Psychiatric: Normal mood and affect. Behavior is normal.   ASSESSMENT: 53 y.o. female here for assessment of the following  1. Gastroesophageal reflux disease, unspecified whether esophagitis present   2. Dyspepsia   3. Long-term current use of proton pump inhibitor therapy   4. Metabolic dysfunction-associated steatotic liver disease (MASLD)    Ongoing history of dyspepsia and reflux over time.  She has had a pretty extensive evaluation over the years.  CT scan suggested esophageal thickening, with her symptoms led to EGD, there is no evidence of Barrett's esophagus on PPI and H2 blocker.  She had some bloating with Pepcid  and stopped it.  She has been on and off Protonix  in recent months, generally feels better taking it.  Discussed this for a bit, reassured her of endoscopy findings.  We discussed long-term risks of chronic PPI.  If she finds that it helps reduce her symptoms she can to continue it.  I recommend using the lowest dose of PPI needed to control symptoms, she can try cutting pill in half as tolerated.  Otherwise discussed fatty liver noted on imaging over the past year.  Interestingly she does not drink any alcohol, and is a lean with a BMI of 24.  We discussed risks for fibrosis and cirrhosis over time. Her Fib4 is low, hopefully risk for fibrosis is low. She does have an elevated A1c and prediabetc. Will need monitoring of this over time, she should see us  yearly, consider Fibroscan in the near year or so.    PLAN: - counseled on long term risks of chronic PPI - reduce dose to 20mg  / day to see if tolerated, can increase back to 40mg  / day if needed - reassured on EGD  and colonoscopy findings. Repeat colonoscopy in 7 years - avoid trigger foods to minimize symptoms - counseled on MASLD, risks for fibrosis and cirrhosis - f/u one year if not sooner with issues  Marcey Naval, MD [facility]     [1]  Social History Tobacco Use   Smoking status: Never   Smokeless tobacco: Never  Vaping Use   Vaping status: Never Used  Substance Use Topics   Alcohol use: No    Alcohol/week: 0.0 standard drinks of alcohol   Drug use: No  [2] No Known Allergies  "
